# Patient Record
Sex: Female | Born: 1960
Health system: Southern US, Community
[De-identification: ages and names within clinical notes are randomized; demographics above are authoritative.]

## PROBLEM LIST (undated history)

## (undated) DIAGNOSIS — I10 Essential (primary) hypertension: Secondary | ICD-10-CM

## (undated) DIAGNOSIS — M1990 Unspecified osteoarthritis, unspecified site: Secondary | ICD-10-CM

## (undated) DIAGNOSIS — Z9889 Other specified postprocedural states: Secondary | ICD-10-CM

## (undated) DIAGNOSIS — D649 Anemia, unspecified: Secondary | ICD-10-CM

## (undated) DIAGNOSIS — Z8673 Personal history of transient ischemic attack (TIA), and cerebral infarction without residual deficits: Secondary | ICD-10-CM

## (undated) DIAGNOSIS — R112 Nausea with vomiting, unspecified: Secondary | ICD-10-CM

## (undated) DIAGNOSIS — G43909 Migraine, unspecified, not intractable, without status migrainosus: Secondary | ICD-10-CM

## (undated) HISTORY — PX: NECK SURGERY: SHX720

## (undated) HISTORY — PX: UPPER GI ENDOSCOPY: SHX6162

## (undated) HISTORY — PX: COLONOSCOPY: SHX174

## (undated) HISTORY — PX: ABDOMINAL HYSTERECTOMY: SHX81

---

## 1999-03-30 ENCOUNTER — Other Ambulatory Visit: Admission: RE | Admit: 1999-03-30 | Discharge: 1999-03-30 | Payer: Self-pay | Admitting: Obstetrics & Gynecology

## 1999-05-28 ENCOUNTER — Emergency Department (HOSPITAL_COMMUNITY): Admission: EM | Admit: 1999-05-28 | Discharge: 1999-05-28 | Payer: Self-pay | Admitting: Emergency Medicine

## 1999-07-07 ENCOUNTER — Encounter: Payer: Self-pay | Admitting: Endocrinology

## 1999-07-07 ENCOUNTER — Ambulatory Visit (HOSPITAL_COMMUNITY): Admission: RE | Admit: 1999-07-07 | Discharge: 1999-07-07 | Payer: Self-pay | Admitting: Endocrinology

## 1999-07-28 ENCOUNTER — Encounter: Payer: Self-pay | Admitting: Endocrinology

## 1999-07-28 ENCOUNTER — Ambulatory Visit (HOSPITAL_COMMUNITY): Admission: RE | Admit: 1999-07-28 | Discharge: 1999-07-28 | Payer: Self-pay | Admitting: Endocrinology

## 1999-10-05 ENCOUNTER — Ambulatory Visit (HOSPITAL_COMMUNITY): Admission: RE | Admit: 1999-10-05 | Discharge: 1999-10-05 | Payer: Self-pay | Admitting: Obstetrics & Gynecology

## 1999-12-03 ENCOUNTER — Encounter: Payer: Self-pay | Admitting: Gastroenterology

## 1999-12-03 ENCOUNTER — Ambulatory Visit (HOSPITAL_COMMUNITY): Admission: RE | Admit: 1999-12-03 | Discharge: 1999-12-03 | Payer: Self-pay | Admitting: Gastroenterology

## 1999-12-21 ENCOUNTER — Inpatient Hospital Stay (HOSPITAL_COMMUNITY): Admission: AD | Admit: 1999-12-21 | Discharge: 1999-12-21 | Payer: Self-pay | Admitting: Obstetrics & Gynecology

## 2000-01-26 ENCOUNTER — Inpatient Hospital Stay (HOSPITAL_COMMUNITY): Admission: AD | Admit: 2000-01-26 | Discharge: 2000-01-26 | Payer: Self-pay | Admitting: *Deleted

## 2000-01-27 ENCOUNTER — Encounter: Payer: Self-pay | Admitting: Obstetrics & Gynecology

## 2000-01-27 ENCOUNTER — Inpatient Hospital Stay (HOSPITAL_COMMUNITY): Admission: AD | Admit: 2000-01-27 | Discharge: 2000-01-27 | Payer: Self-pay | Admitting: Obstetrics & Gynecology

## 2000-03-04 ENCOUNTER — Other Ambulatory Visit: Admission: RE | Admit: 2000-03-04 | Discharge: 2000-03-04 | Payer: Self-pay | Admitting: Obstetrics & Gynecology

## 2000-03-10 ENCOUNTER — Ambulatory Visit (HOSPITAL_COMMUNITY): Admission: RE | Admit: 2000-03-10 | Discharge: 2000-03-10 | Payer: Self-pay | Admitting: *Deleted

## 2000-03-10 ENCOUNTER — Encounter (INDEPENDENT_AMBULATORY_CARE_PROVIDER_SITE_OTHER): Payer: Self-pay | Admitting: Specialist

## 2000-03-12 ENCOUNTER — Inpatient Hospital Stay (HOSPITAL_COMMUNITY): Admission: AD | Admit: 2000-03-12 | Discharge: 2000-03-15 | Payer: Self-pay | Admitting: Obstetrics and Gynecology

## 2000-03-13 ENCOUNTER — Encounter: Payer: Self-pay | Admitting: Obstetrics and Gynecology

## 2000-03-14 ENCOUNTER — Encounter: Payer: Self-pay | Admitting: Obstetrics and Gynecology

## 2000-10-04 ENCOUNTER — Encounter (INDEPENDENT_AMBULATORY_CARE_PROVIDER_SITE_OTHER): Payer: Self-pay | Admitting: Specialist

## 2000-10-04 ENCOUNTER — Observation Stay (HOSPITAL_COMMUNITY): Admission: RE | Admit: 2000-10-04 | Discharge: 2000-10-05 | Payer: Self-pay | Admitting: Obstetrics and Gynecology

## 2001-03-18 ENCOUNTER — Emergency Department (HOSPITAL_COMMUNITY): Admission: EM | Admit: 2001-03-18 | Discharge: 2001-03-18 | Payer: Self-pay

## 2001-03-22 ENCOUNTER — Encounter: Admission: RE | Admit: 2001-03-22 | Discharge: 2001-03-22 | Payer: Self-pay | Admitting: Endocrinology

## 2001-03-22 ENCOUNTER — Encounter: Payer: Self-pay | Admitting: Endocrinology

## 2002-09-20 ENCOUNTER — Other Ambulatory Visit: Admission: RE | Admit: 2002-09-20 | Discharge: 2002-09-20 | Payer: Self-pay | Admitting: Obstetrics and Gynecology

## 2002-10-12 ENCOUNTER — Encounter: Payer: Self-pay | Admitting: Obstetrics and Gynecology

## 2002-10-12 ENCOUNTER — Ambulatory Visit (HOSPITAL_COMMUNITY): Admission: RE | Admit: 2002-10-12 | Discharge: 2002-10-12 | Payer: Self-pay | Admitting: Obstetrics and Gynecology

## 2004-12-22 ENCOUNTER — Other Ambulatory Visit: Admission: RE | Admit: 2004-12-22 | Discharge: 2004-12-22 | Payer: Self-pay | Admitting: Obstetrics and Gynecology

## 2006-03-29 ENCOUNTER — Other Ambulatory Visit: Admission: RE | Admit: 2006-03-29 | Discharge: 2006-03-29 | Payer: Self-pay | Admitting: Obstetrics and Gynecology

## 2006-04-06 ENCOUNTER — Ambulatory Visit (HOSPITAL_COMMUNITY): Admission: RE | Admit: 2006-04-06 | Discharge: 2006-04-06 | Payer: Self-pay | Admitting: Obstetrics and Gynecology

## 2007-11-30 ENCOUNTER — Ambulatory Visit (HOSPITAL_COMMUNITY): Admission: RE | Admit: 2007-11-30 | Discharge: 2007-11-30 | Payer: Self-pay | Admitting: Obstetrics and Gynecology

## 2008-12-24 ENCOUNTER — Encounter: Admission: RE | Admit: 2008-12-24 | Discharge: 2008-12-24 | Payer: Self-pay | Admitting: Endocrinology

## 2009-01-14 ENCOUNTER — Encounter: Admission: RE | Admit: 2009-01-14 | Discharge: 2009-01-14 | Payer: Self-pay | Admitting: Internal Medicine

## 2009-06-03 ENCOUNTER — Encounter: Admission: RE | Admit: 2009-06-03 | Discharge: 2009-06-03 | Payer: Self-pay | Admitting: Diagnostic Radiology

## 2009-08-08 ENCOUNTER — Encounter: Admission: RE | Admit: 2009-08-08 | Discharge: 2009-08-08 | Payer: Self-pay | Admitting: Neurosurgery

## 2009-08-13 ENCOUNTER — Ambulatory Visit (HOSPITAL_COMMUNITY): Admission: RE | Admit: 2009-08-13 | Discharge: 2009-08-14 | Payer: Self-pay | Admitting: Neurosurgery

## 2010-08-23 ENCOUNTER — Encounter: Payer: Self-pay | Admitting: Obstetrics and Gynecology

## 2010-10-18 LAB — CBC
HCT: 36.6 % (ref 36.0–46.0)
MCV: 88.6 fL (ref 78.0–100.0)
Platelets: 175 10*3/uL (ref 150–400)
RBC: 4.13 MIL/uL (ref 3.87–5.11)
RDW: 12.9 % (ref 11.5–15.5)
WBC: 4 10*3/uL (ref 4.0–10.5)

## 2010-10-18 LAB — BASIC METABOLIC PANEL
CO2: 31 mEq/L (ref 19–32)
Calcium: 9.9 mg/dL (ref 8.4–10.5)
Chloride: 99 mEq/L (ref 96–112)
Creatinine, Ser: 0.95 mg/dL (ref 0.4–1.2)
GFR calc Af Amer: >60 mL/min (ref >60)
GFR calc non Af Amer: >60 mL/min (ref >60)
Glucose, Bld: 83 mg/dL (ref 70–99)
Sodium: 139 mEq/L (ref 135–145)

## 2010-12-18 NOTE — Op Note (Signed)
[facility]  Patient:    Tammy Aguirre, Tammy Aguirre                     MRN: [id number] Proc. Date: 03/10/00 Adm. Date:  [id number] Attending:  Leonard Schwartz                           Operative Report  PREOPERATIVE DIAGNOSIS:           Pelvic pain; desires permanent                                   sterilization.  SURGEON:                          Cecilio Asper, M.D.  PROCEDURE:                        1. Diagnostic laparoscopy with lysis                                      of adhesions.                                   2. Peritoneal biopsy.                                   3. Bilateral tubal fulguration.  ANESTHESIA:                       General.  ESTIMATED BLOOD LOSS:             Minimal.  FINDINGS:                         Fibroid uterus. Questionable mattress window in the left lower quadrant.  Brownish lesion underneath the uterus, towards the left posterior cul-de-sac.  Right lower quadrant intestinal abdominal adhesions.  COMPLICATIONS:                    None.  HISTORY:                          The patient is a 50 year old gravida 3, para 2-0-1-2; who presents complaining of right lower quadrant pelvic pain. The patient has a family history of endometriosis.  She desires diagnostic laparoscopy for diagnosis.  She also desires bilateral tubal fulguration for permanent sterilization.  She understands that this procedure is to be considered and irreversible.  She also understands the small risk of tubal failure, and should failure occur there is an increased risk of ectopic pregnancy.  DESCRIPTION OF PROCEDURE:         After an adequate of general anesthesia was obtained, the patient was prepped in sterile fashion.  The bladder was drained of urine with a red rubber catheter.  The speculum was placed inside of the vagina.  A single-tooth tenaculum was placed in the anterior lip of the cervix, and the Coley cannula was placed  inside the cervical os.  The speculum was then  removed.  The patient was subsequently draped.  Attention was turned to the abdominal region, where an infraumbilical incision was made.  A Veress needle was placed without difficulty.  Opening pressures of CO2 gas were less than 5 mmHg, to approximately two liters of CO2 gas was insufflated into the pelvis and abdomen.  A 10 mm trocar was inserted into this incision.  Two 5 mm trocars were placed; one in the midline suprapubic region, the other in the right lower quadrant.  The brownish lesion in the posterior cul-de-sac was then elevated with a grasper and excised with Hook scissors.  The intestinal/abdominal adhesions were then excised with the Hook scissors.  The mattress window was noted, and pictures were taken.  The fallopian tubes were then identified and followed out to their fimbriated end; fulgurated x 3 without difficulty.  Hemostasis was assured.                                    The lower abdominal ports were then removed using direct visualization.  The camera was removed.  CO2 gas was removed from the pelvis and abdomen.  The 10 mm trocar was then removed.  The umbilical incision was reapproximated with subcuticular 4-0 Vicryl.  Steri-Strips were placed on the 5 mm midline and right lower quadrant port.  Bandages were applied.  Vaginal instruments were removed.  The patient tolerated the procedure well.  She was taken to the recovery room in stable condition.  POSTOPERATIVE DIAGNOSIS:          Pelvic pain, desires permanent                                   sterilization. DD:  03/10/00 TD:  03/11/00 Job: [id number]

## 2010-12-18 NOTE — Op Note (Signed)
[facility]  Patient:    Tammy Aguirre, Tammy Aguirre                     MRN: [id number] Proc. Date: 10/04/00 Adm. Date:  [id number] Attending:  Leonard Schwartz                           Operative Report  PREOPERATIVE DIAGNOSES:       1. Pelvic pain.                               2. Endometriosis.                               3. Fibroid uterus.  POSTOPERATIVE DIAGNOSES:      1. Pelvic pain.                               2. Endometriosis.                               3. Fibroid uterus.  PROCEDURE:                    Vaginal hysterectomy with vaginal bilateral                               salpingo-oophorectomy.  SURGEON:                      Janine Limbo, M.D.  ASSISTANT:                    Henreitta Leber, P.A.-C.  ANESTHESIA:                   General.  ESTIMATED BLOOD LOSS:         100 cc.  DISPOSITION:                  Ms. Mcquilkin is a 50 year old female who presents with a known history of endometriosis. She has been treated with Lupron Depot in the past but her pain has returned. She does not desire to have any more children. Pain medications have not relieved her discomfort. She is ready to proceed at this point with definitive therapy. The patient understands the indications for her surgical procedure and she accepts the risks of, but not limited to, anesthetic complications, bleeding, infections, and possible damage to the surrounding organs.  FINDINGS:                     The uterus was normal size, shape, and consistency. There was a small fibroid present. The fallopian tubes and ovaries appeared normal.  DESCRIPTION OF PROCEDURE:     The patient was taken to the operating room where a general anesthetic was given. The patients abdomen, perineum, and vagina were prepped with multiple layers of Betadine. A Foley catheter was placed in the bladder. The patient was sterilely draped. Examination under anesthesia was performed. The  cervix was injected with a diluted solution of Pitressin and saline. A circumferential incision was made around the cervix and the anterior and posterior cul-de-sacs were sharply  entered. Alternating from right to left, the uterosacral ligaments, paracervical tissues, parametrial tissues, uterine arteries, and upper pedicles were clamped, cut, sutured, and tied securely. The left infundibulopelvic ligament was isolated. The ligament was then clamped, cut, free tied, and suture ligated. An identical procedure was carried out on the opposite side. Hemostasis was adequate throughout. Sutures attached to the uterosacral ligaments were then brought out through the vaginal angles and tied securely. A McCall culdoplasty suture was placed in the posterior cul-de-sac incorporating the uterosacral ligaments bilaterally and the posterior peritoneum. A final check was made for hemostasis and hemostasis was indeed adequate. The vaginal cuff was then closed using figure-of-eight sutures incorporating the anterior vaginal mucosa, the anterior peritoneum, the posterior peritoneum, and then the posterior vaginal mucosa. The McCall culdoplasty suture was tied and the apex of the vagina was noted to elevate into the mid pelvis. Sponge, needle, and instrument counts were correct on two occasions. The estimated blood loss was 100 cc. 0 Vicryl was the suture material used throughout the case. The patient tolerated her procedure well. She was awakened from her anesthetic and taken to the recovery room in stable condition. She was noted to drains clear yellow urine at the end of her procedure. DD:  10/04/00 TD:  10/04/00 Job: [id number]

## 2010-12-18 NOTE — H&P (Signed)
[facility]  Patient:    Tammy Aguirre, Tammy Aguirre                       MRN: [id number] Adm. Date:  10/04/00 Attending:  Janine Limbo, M.D.                         History and Physical  HISTORY OF PRESENT ILLNESS:   Tammy Aguirre is a 50 year old female, para 2-0-1-2, who presents for vaginal hysterectomy with bilateral salpingo-oophorectomy because of pelvic pain and endometrial cysts.  The patient had a diagnostic laparoscopy in August 2001 where she was found to have endometriosis.  She was treated with Lupron Depot with relief of her symptoms.  Her pain returned after her Lupron Depot was continued.  At this point, the patient does not plan to have any more children, and she is ready to proceed with definitive therapy.  She has had a tubal ligation in the past. Pain medicines have not been able to keep her comfortable.  She denies a history of sexually transmitted infections in the past.  She has had a cesarean section in the past as well.  OBSTETRICAL HISTORY:          The patient has had a cesarean in the past.  DRUG ALLERGIES:               TETRACYCLINE causes vomiting and a rash.  PAST MEDICAL HISTORY:         The patient has a history of hypertension.  CURRENT MEDICATIONS:          She is currently taking Tiazac 350 mg each day. She also takes a diuretic as needed.  She has recently taken Benadryl because of cold symptoms.  She takes Vicodin as needed for pain.  SOCIAL HISTORY:               The patient denies cigarette use, alcohol [place], and recreational drug use.  She is married.  She works as a Diplomatic Services operational officer at [employer].  Review of Systems             Noncontributory.  FAMILY HISTORY:               The patients mother and father have hypertension.  PHYSICAL EXAMINATION:  VITAL SIGNS:                  Weight is 138 pounds, height 5 feet 1/2 inch.  HEENT:                        Within normal limits.  CHEST:                         Clear.  HEART:                        Regular rate and rhythm.  BREASTS:                      Without masses.  ABDOMEN:                      Nontender.  EXTREMITIES:                  Within normal limits.  NEUROLOGIC:  Exam is normal.  PELVIC:                       External genitalia is normal.  Vagina is normal. Cervix is nontender.  Uterus is normal size, shape, and consistency.  Adnexa has no masses.  Rectovaginal exam confirms.  ASSESSMENT:                   1. Pelvic pain.                               2. Endometriosis.                               3. Fibroids noted on prior laparoscopy.  PLAN:                         A long discussion was held with the patient about her medical and surgical options.  After carefully reviewing the risks and benefits of each of those options, the patient has decided to proceed with vaginal hysterectomy and vaginal bilateral salpingo-oophorectomy.  She understands that hormone replacement therapy will be needed.  The patient understands the indications for her surgical procedure, and she accepts the risk of, but not limited to, anesthetic complications, bleeding, infections, and possible damage to the surrounding organs. DD:  10/03/00 TD:  10/04/00 Job: [id number]

## 2010-12-18 NOTE — Discharge Summary (Signed)
[facility]  Patient:    Tammy Aguirre, Tammy Aguirre                     MRN: [id number] Adm. Date:  [id number] Disc. Date: [id number] Attending:  Leonard Schwartz Dictator:   Henreitta Leber, P.A.C.                           Discharge Summary  DISCHARGE DIAGNOSES:          1. Severe right pelvic pain, two days                                  postoperative laparoscopic surgery.                               2. Chronic hypertension.                               3. Leukopenia.  PROCEDURES:                   Chest x-ray.  Abdominal pelvic CT scan                               and IV antibiotics.  HISTORY OF PRESENT ILLNESS:   This is a 50 year old female gravida 3, para 2-0-1-2 with a history of hypertension who was postoperative laparoscopic for lysis of adhesions on intestinal and abdominal walls, biopsy of endometriosis lesions in the cul-de-sac and bilateral tubal ligation.  (The patient was also found to have a fibroid).  She presented to [facility] reporting a fever of 103.7 at home with severe right flank and lower pelvic pain.  See dictated history for details.  ADMISSION PHYSICAL EXAMINATION:  VITAL SIGNS:                  Blood pressure 152/95, temperature 100.5, pulse 91, respiratory rate 24.  GENERAL EXAMINATION:          Within normal limits.  ABDOMEN:                      Bowel sounds present.  Abdomen distended with tenderness and guarding in both lower quadrants right greater than left accompanied by rebound.  Umbilical incision is clean, dry and intact.  PELVIC:                       EGBUS is within normal limits.  Vagina tender over the bladder area.  Cervix and uterus were both tender without palpable masses.  Adnexa was full on the right accompanied by tenderness.  HOSPITAL COURSE:              On the date of admission the patient was placed on analgesia and IV antibiotics and gradually showed improvement in her pain and temperature  which became maximized on hospital stay day #3 at 101.8. Bowel and bladder functions remained active throughout her hospital stay.  CT scan of abdomen and pelvis did not reveal any masses or abscesses and her chest x-ray only revealed a possible small pleural effusion. The patients fever had resolved and pelvic pain returned to baseline by hospital day #4 and was  deemed ready for discharge to home.  DISCHARGE LABS:               Hemoglobin 10.4 (admission hemoglobin 11.6), white blood cell count 2.5 (admission white blood cell count 7.3).  DISCHARGE MEDICATIONS:        Vicodin one tablet every four hours as needed for pain.  FOLLOW-UP:                    The patient is to return to [facility] on March 17, 2000, at 8:45 for a repeat complete blood count. She also is also to follow up with Cecilio Asper, M.D., on March 24, 2000, for evaluation.  DISCHARGE INSTRUCTIONS:       The patient is to keep her laparoscopic incision wound clean and dry.  ACTIVITY:                     This is as the patient is able to tolerate.  She may return to work.  DIET:                         Without restrictions. DD:  04/21/00 TD:  04/22/00 Job: [id number]

## 2010-12-18 NOTE — H&P (Signed)
[facility]  Patient:    Tammy Aguirre, Tammy Aguirre                     MRN: [id number] Adm. Date:  [id number] Disc. Date: [id number] Attending:  Cleatrice Burke Dictator:   Miguel Dibble, C.N.M.                         History and Physical  HISTORY OF PRESENT ILLNESS:   This is a 50 year old gravida 3, para 2-0-1-2, who is two days postoperative laparoscopic surgery at which time she had lysis of adhesions on the intestinal walls and abdominal walls, biopsy of endometriosis lesions in the cul-de-sac and a bilateral tubal ligation.  She was also found to have a fibroid.  She presented in maternity admissions unit having stated that she had a fever that reached as high as 103.7 at home and severe right flank and right lower pelvic pain.  She has been passing flatus during the day and without difficulty since her surgery, however, she did not have a bowel movement since August 9.  She has a history of chronic hypertension and has been on Tiazac 360 mg daily which she has taken today. She has not taken her diuretic today.  On admission her laboratory results were a hemoglobin 11.6, hematocrit 36.2, white count 7.3, platelets 169. Clean-catch urinalysis was negative for nitrites, wbcs and rbcs.  Her temperature is 105, pulse 91, respirations 24, blood pressure 152/95.  MEDICAL HISTORY:              Significant for allergy to tetracycline, chronic hypertension, laparoscope for pelvic pain in 1999 that did not reveal endometriosis at the time, one miscarriage, two normal vaginal deliveries, cesarean section delivery, otherwise her previous medical history was noncontributory.  FAMILY HISTORY:               Significant for a strong family history of endometriosis, migraines and heart disease.  SOCIAL HISTORY:               The patient denies smoking, alcohol or drug abuse.  She is married and her husband is present and supportive.  PHYSICAL  EXAMINATION:  HEAD, EYES, EARS AND THROAT:  Within normal limits.  LUNGS:                        Bilaterally clear.  HEART:                        Regular rate and rhythm.  ABDOMEN:                      The abdomen has a generalized tenderness and is distended, positive bowel sounds, no obvious masses.  Umbilical incision is clean, dry and intact.  PELVIC:                       No vaginal bleeding noted.  EXTREMITIES:                  Negative edema.  DTRs +1.  ASSESSMENT:                   1. Severe right pelvic pain and right flank  pain, two days postoperative laparoscopic                                  surgery.                               2. Chronic hypertension.                               3. Rule out bowel complications of surgery.  PLAN:                         Admit for a 23-hour observation.  Dr. Stefano Gaul will manage the patient.  Will consider a CAT scan of the abdomen.  IV antibiotics, Cefotan and Cleocin.  Anticipate discharge on August 12, if stable. DD:  03/12/00 TD:  03/13/00 Job: [id number]

## 2010-12-18 NOTE — H&P (Signed)
[facility]  Patient:    Tammy Aguirre, Tammy Aguirre                     MRN: [id number] Adm. Date:  [id number] Disc. Date: [id number] Attending:  Cleatrice Burke                         History and Physical  HISTORY OF PRESENT ILLNESS:   The patient is a 50 year old gravida 3, para 2-0-1-2, who presented in August complaining of right lower abdominal pain since the birth of her son 1-1/2 years ago.  The patient initially stated that the pain was uncomfortable when lying down or picking up her children, and at that point it was thought the patient was having musculoskeletal pain.  In May of this year, the patient complained of severe abdominal pain in the left lower quadrant, and this resolved after a dose of IM Toradol.  In June of this year, the patient represented complaining of right lower quadrant abdominal pelvic pain.  She stated that this pain began one week prior to her menstrual period and that it was totally different from any of the pains that she had noted in the past.  She described the pain as being sharp and achy and was worsening in intensity, worsened with walking, and was accompanied by a sensation of pressure.  She did have a laparoscopy in 1999 for pelvic pain, which did not reveal any significant findings per the patient.  The patient states that her two sisters both have endometriosis, and the patient desires diagnostic laparoscopy to evaluate the etiology of her pelvic pain.  The patient is also desiring to end her fertility.  She understands that permanent sterilization is considered permanent and irreversible.  She also understands there is a small risk of tubal failure, and should failure occur, there is an increased risk of ectopic pregnancy.  ALLERGIES:                    The patient has no known drug allergies.  MEDICATIONS:                  Tiazac 360 mg a day, and triamterene/HCTZ as needed.  PAST MEDICAL HISTORY:          Medical illnesses:  Hypertension.  PAST SURGICAL HISTORY:        Wisdom tooth extraction, cesarean delivery, and laparoscopy.  FAMILY HISTORY:               The patient has a sister with a history of migraines, mother and father have heart disease.  She also has a sister with endometriosis.  SOCIAL HISTORY:               The patient denies tobacco or recreational drug use.  She rarely drinks alcoholic beverages.  She is married and denies domestic violence.  REVIEW OF SYSTEMS:            The patient denies nausea, vomiting, or diarrhea.  Her menstrual cycles occur once a month, last two to three days, and she only uses three to four pads per day.  The patient denies loose bowel movements, dyspareunia, fever, back pain, vomiting, or diarrhea.  PHYSICAL EXAMINATION:  VITAL SIGNS:                  Blood pressure is 110/70, weight is 138.  HEENT:  Within normal limits.  LUNGS:                        Clear to auscultation bilaterally.  HEART:                        Regular rate and rhythm.  ABDOMEN:                      Soft with right lower quadrant tenderness to deep palpation.  There is no rebound, no guarding, no CVA tenderness.  EXTREMITIES:                  No clubbing, cyanosis, or edema.  PELVIC:                       Normal external female genitalia.  Vagina is without discharge.  Cervix without lesion.  Uterus is normal size, shape, and consistency.  Adnexa:  The right adnexa is tender to deep palpation.  The patient states that the pain radiates to the medial aspect of her right thigh.  RECTAL:                       Noncontributory.  LABORATORY DATA:              Her white count from May 2001 is 5.9, hemoglobin 11.5, hematocrit 34.6, platelet count 179.  Differential is within normal limits.  Pelvic ultrasound from June 27 is normal appearance of both ovaries, normal retroverted uterus, small amount of free fluid in the right adnexa and adjacent  to the uterus, no evidence of hydronephrosis.  Chem-9 from June 27 is negative.  Pap from August 2000 is benign.  GC and Chlamydia cultures May 2001 were negative.  Urine culture June 2001 is negative.  ASSESSMENT:                   1. Family history of endometriosis with right lower quadrant pain, increasing in intensity.                               2. Desires permanent sterilization.  PLAN:                         The risks, benefits, and alternatives of diagnostic laparoscopy were reviewed.  The alternatives included Depo-Lupron. The patient desires definitive therapy.  She understands the risks to include but not be limited to a risk of bleeding, a risk of infection, a risk of damage to internal origins such as bowel, bladder, and ureter.  The patient also understands that 30% of diagnostic laparoscopies are negative.  The patient is willing to accept these risks.  She has therefore been consented. The procedure will be performed on August 9. DD:  03/10/00 TD:  03/10/00 Job: [id number]

## 2011-09-13 ENCOUNTER — Other Ambulatory Visit: Payer: Self-pay

## 2011-09-13 ENCOUNTER — Emergency Department (HOSPITAL_BASED_OUTPATIENT_CLINIC_OR_DEPARTMENT_OTHER): Payer: BC Managed Care – PPO

## 2011-09-13 ENCOUNTER — Emergency Department (HOSPITAL_BASED_OUTPATIENT_CLINIC_OR_DEPARTMENT_OTHER)
Admission: EM | Admit: 2011-09-13 | Discharge: 2011-09-13 | Disposition: A | Discharge status: Home / Self Care | Payer: BC Managed Care – PPO | Attending: Emergency Medicine | Admitting: Emergency Medicine

## 2011-09-13 ENCOUNTER — Encounter (HOSPITAL_BASED_OUTPATIENT_CLINIC_OR_DEPARTMENT_OTHER): Payer: Self-pay | Admitting: Family Medicine

## 2011-09-13 ENCOUNTER — Emergency Department (INDEPENDENT_AMBULATORY_CARE_PROVIDER_SITE_OTHER): Payer: BC Managed Care – PPO

## 2011-09-13 DIAGNOSIS — E876 Hypokalemia: Secondary | ICD-10-CM | POA: Insufficient documentation

## 2011-09-13 DIAGNOSIS — R0602 Shortness of breath: Secondary | ICD-10-CM

## 2011-09-13 DIAGNOSIS — R059 Cough, unspecified: Secondary | ICD-10-CM

## 2011-09-13 DIAGNOSIS — R05 Cough: Secondary | ICD-10-CM

## 2011-09-13 DIAGNOSIS — I10 Essential (primary) hypertension: Secondary | ICD-10-CM | POA: Insufficient documentation

## 2011-09-13 DIAGNOSIS — R079 Chest pain, unspecified: Secondary | ICD-10-CM | POA: Insufficient documentation

## 2011-09-13 DIAGNOSIS — R071 Chest pain on breathing: Secondary | ICD-10-CM | POA: Insufficient documentation

## 2011-09-13 DIAGNOSIS — R0789 Other chest pain: Secondary | ICD-10-CM

## 2011-09-13 HISTORY — DX: Essential (primary) hypertension: I10

## 2011-09-13 LAB — CBC
MCH: 29.1 pg (ref 26.0–34.0)
Platelets: 190 10*3/uL (ref 150–400)
RBC: 3.95 MIL/uL (ref 3.87–5.11)
WBC: 5.8 10*3/uL (ref 4.0–10.5)

## 2011-09-13 LAB — DIFFERENTIAL
Basophils Relative: 1 % (ref 0–1)
Monocytes Relative: 8 % (ref 3–12)
Neutro Abs: 3.7 10*3/uL (ref 1.7–7.7)
Neutrophils Relative %: 64 % (ref 43–77)

## 2011-09-13 LAB — BASIC METABOLIC PANEL
Calcium: 9.3 mg/dL (ref 8.4–10.5)
Creatinine, Ser: 0.9 mg/dL (ref 0.50–1.10)
GFR calc Af Amer: 86 mL/min — ABNORMAL LOW (ref >90)
Potassium: 3 mEq/L — ABNORMAL LOW (ref 3.5–5.1)
Sodium: 140 mEq/L (ref 135–145)

## 2011-09-13 LAB — TROPONIN I: Troponin I: <0.3 ng/mL (ref <0.30)

## 2011-09-13 MED ORDER — POTASSIUM CHLORIDE CRYS ER 20 MEQ PO TBCR
40.0000 meq | EXTENDED_RELEASE_TABLET | Freq: Once | ORAL | Status: AC
  Administered 2011-09-13: 40 meq via ORAL

## 2011-09-13 MED ORDER — POTASSIUM CHLORIDE CRYS ER 20 MEQ PO TBCR
EXTENDED_RELEASE_TABLET | ORAL | Status: AC
  Administered 2011-09-13: 40 meq via ORAL
  Filled 2011-09-13: qty 2

## 2011-09-13 NOTE — ED Provider Notes (Signed)
This chart was scribed for Hurman Horn, MD by Wallis Mart. The patient was seen in room MHT14/MHT14 and the patient's care was started at 4:48 PM.   CSN: [id number]  Arrival date & time 09/13/11  1613   First MD Initiated Contact with Patient 09/13/11 1642      Chief Complaint  Patient presents with  . Chest Pain    (Consider location/radiation/quality/duration/timing/severity/associated sxs/prior treatment) HPI Tammy Aguirre is a 51 y.o. female who presents to the Emergency Department complaining of gradual onset, persistence of constant, moderate central chest pain onset several days ago.  Pt had an intermittent cough a week ago,was given cough medicine with codeine, cough improved. Nothing aggravates or improves the cp.  Pt states she felt SOB while walking up stairs to work.  Additionally, Pt's hand was swollen this morning. Pt denies rash, fever diarrhea dizziness, swollen legs, back pain.    Pt denies h/o blood clots in lungs or legs.    Past Medical History  Diagnosis Date  . Hypertension     Past Surgical History  Procedure Date  . Neck surgery   . Cesarean section   . Abdominal hysterectomy     History reviewed. No pertinent family history.  History  Substance Use Topics  . Smoking status: Never Smoker   . Smokeless tobacco: Not on file  . Alcohol Use: No    OB History    Grav Para Term Preterm Abortions TAB SAB Ect Mult Living                  Review of Systems  Constitutional: Negative for fever.       10 Systems reviewed and are negative for acute change except as noted in the HPI.  HENT: Negative for congestion.   Eyes: Negative for discharge and redness.  Respiratory: Positive for cough. Negative for shortness of breath.   Cardiovascular: Positive for chest pain.  Gastrointestinal: Negative for vomiting and abdominal pain.  Musculoskeletal: Negative for back pain.  Skin: Negative for rash.  Neurological: Negative for syncope, numbness and  headaches.  Psychiatric/Behavioral:       No behavior change.    Allergies  Tetracyclines & related  Home Medications   Current Outpatient Rx  Name Route Sig Dispense Refill  . ESTROGENS CONJUGATED 0.625 MG PO TABS Oral Take 0.625 mg by mouth daily. Take daily for 21 days then do not take for 7 days.    Marland Kitchen HYDROCHLOROTHIAZIDE 25 MG PO TABS Oral Take 25 mg by mouth daily.    Marland Kitchen VALSARTAN 80 MG PO TABS Oral Take 80 mg by mouth daily.      BP 157/76  Pulse 59  Temp(Src) 98.2 F (36.8 C) (Oral)  Resp 15  Ht 5\' 1"  (1.549 m)  Wt 125 lb (56.7 kg)  BMI 23.62 kg/m2  SpO2 100%  Physical Exam  Nursing note and vitals reviewed. Constitutional:       Awake, alert, nontoxic appearance.  HENT:  Head: Atraumatic.  Eyes: Right eye exhibits no discharge. Left eye exhibits no discharge.  Neck: Normal range of motion. Neck supple.  Cardiovascular: Normal rate, regular rhythm and normal heart sounds.   No murmur heard. Pulmonary/Chest: Effort normal and breath sounds normal. She has no wheezes. She exhibits tenderness.       Mild perasternal tenderness  Abdominal: Soft. There is no tenderness. There is no rebound.  Musculoskeletal: She exhibits no tenderness.       Baseline ROM, no obvious  new focal weakness.  Neurological:       Mental status and motor strength appears baseline for patient and situation.  Skin: No rash noted.  Psychiatric: She has a normal mood and affect.    ED Course  Procedures (including critical care time) DIAGNOSTIC STUDIES: Oxygen Saturation is 100% on room air, normal by my interpretation.    COORDINATION OF CARE:  8:30 PM: All results reviewed and discussed with pt, questions answered, pt agreeable with plan. Pt to be discharged.   Labs Reviewed  BASIC METABOLIC PANEL - Abnormal; Notable for the following:    Potassium 3.0 (*)    GFR calc non Af Amer 74 (*)    GFR calc Af Amer 86 (*)    All other components within normal limits  CBC - Abnormal;  Notable for the following:    Hemoglobin 11.5 (*)    HCT 33.8 (*)    All other components within normal limits  DIFFERENTIAL  TROPONIN I   No results found.   1. Chest wall pain   2. Hypokalemia       MDM  I doubt any other EMC precluding discharge at this time including, but not necessarily limited to the following:ACS.     I personally performed the services described in this documentation, which was scribed in my presence. The recorded information has been reviewed and considered.      Hurman Horn, MD 09/17/11 2255

## 2011-09-13 NOTE — ED Notes (Signed)
Pt c/o central chest pain "over a week" and also having a "cold and cough" for same length of time.

## 2011-09-13 NOTE — ED Provider Notes (Signed)
Several days of coughing with constant nonstop parasternal chest wall tenderness and chest pain which is nonradiating and without associated symptoms other than the cough and some slight shortness of breath when she was walking up stairs today but clear lungs in the emergency department PERC negative, the patient agrees no d-dimer or CT angiogram appears indicated.  ECG: Sinus bradycardia, ventricular rate 53, normal axis, no we'll intervals, no acute ischemic changes noted, no significant change compared to January 2011  Hurman Horn, MD 09/17/11 [phone]

## 2011-09-13 NOTE — ED Notes (Signed)
Was unable to obtain discharge signature due to sig pad locking up on mobile station.  Was unable to pull signature activity up to obtain signature.  Pt received discharge paperwork from this RN and voiced understanding that she would follow up with her pcp and take medications as prescribed.

## 2011-09-13 NOTE — ED Notes (Signed)
Pt has requested pain medication, crackers, and drink.  Dr Fonnie Jarvis informed of pt's request and pt's decr K level of 3.0.  No orders received for K or pain medication.  OK for pt to have drink and crackers.

## 2012-07-13 ENCOUNTER — Ambulatory Visit (INDEPENDENT_AMBULATORY_CARE_PROVIDER_SITE_OTHER): Payer: BC Managed Care – PPO | Admitting: Obstetrics and Gynecology

## 2012-07-13 ENCOUNTER — Encounter: Payer: Self-pay | Admitting: Obstetrics and Gynecology

## 2012-07-13 VITALS — BP 124/64 | HR 68 | Resp 16 | Ht 60.0 in | Wt 131.0 lb

## 2012-07-13 DIAGNOSIS — Z01419 Encounter for gynecological examination (general) (routine) without abnormal findings: Secondary | ICD-10-CM

## 2012-07-13 NOTE — Progress Notes (Signed)
Subjective:    Tammy Aguirre is a 51 y.o. female No obstetric history on file. who presents for annual exam. The patient had a hysterectomy and BSO for endometriosis. She currently takes Premarin 0.625 mg daily. The patient complaints of stress associated with her daughter going off to college.  The following portions of the patient's history were reviewed and updated as appropriate: allergies, current medications, past family history, past medical history, past social history, past surgical history and problem list.  Review of Systems Pertinent items are noted in HPI. Gastrointestinal:No change in bowel habits, no abdominal pain, no rectal bleeding Genitourinary:negative for dysuria, frequency, hematuria, nocturia and urinary incontinence    Objective:     BP 124/64  Pulse 68  Resp 16  Ht 5' (1.524 m)  Wt 131 lb (59.421 kg)  BMI 25.58 kg/m2  Weight:  Wt Readings from Last 1 Encounters:  07/13/12 131 lb (59.421 kg)     BMI: Body mass index is 25.58 kg/(m^2). General Appearance: Alert, appropriate appearance for age. No acute distress HEENT: Grossly normal Neck / Thyroid: Supple, no masses, nodes or enlargement Lungs: clear to auscultation bilaterally Back: No CVA tenderness Breast Exam: No masses or nodes.No dimpling, nipple retraction or discharge. Cardiovascular: Regular rate and rhythm. S1, S2, no murmur Gastrointestinal: Soft, non-tender, no masses or organomegaly  ++++++++++++++++++++++++++++++++++++++++++++++++++++++++  Pelvic Exam: External genitalia: normal general appearance Vaginal: normal without tenderness, induration or masses Cervix: absent Adnexa: normal bimanual exam Uterus: absent Rectovaginal: normal rectal, no masses  ++++++++++++++++++++++++++++++++++++++++++++++++++++++++  Lymphatic Exam: Non-palpable nodes in neck, clavicular, axillary, or inguinal regions  Psychiatric: Alert and oriented, appropriate affect.      Assessment:    Normal gyn  exam   Overweight or obese: No  Pelvic relaxation: Yes  Menopausal symptoms: No. Severe: No.   Plan:    Mammogram.   Follow-up:  for annual exam  Premarin 0.625 mg daily  The updated Pap smear screening guidelines were discussed with the patient. The patient requested that I obtain a Pap smear: No.  Kegel exercises discussed: Yes.  Proper diet and regular exercise were reviewed.  Annual mammograms recommended starting at age 35. Proper breast care was discussed.  Screening colonoscopy is recommended beginning at age 36.  Regular health maintenance was reviewed.  Sleep hygiene was discussed.  Adequate calcium and vitamin D intake was emphasized.  Leonard Schwartz M.D.   Regular Periods: no Mammogram: yes  Monthly Breast Ex.: yes Exercise: yes  Tetanus < 10 years: yes Seatbelts: no  NI. Bladder Functn.: yes Abuse at home: yes  Daily BM's: yes Stressful Work: yes  Healthy Diet: yes Sigmoid-Colonoscopy: yes  Calcium: no Medical problems this year: None   LAST PAP:2009  Contraception: Hyst  Mammogram:  2012 WNL  PCP: Cammie Fulp  PMH: None  FMH: None  Last Bone Scan: None

## 2012-08-10 ENCOUNTER — Telehealth: Payer: Self-pay

## 2012-08-10 NOTE — Telephone Encounter (Signed)
Patient needs a doctors note for the day she was here up until today if possible, her husband will come pick this up when we call the number is [phone]

## 2012-08-10 NOTE — Telephone Encounter (Signed)
She was not seen here, she was trying to call the urgent care [facility], advised her she has called wrong office

## 2014-09-26 ENCOUNTER — Other Ambulatory Visit: Payer: Self-pay | Admitting: Family Medicine

## 2014-09-26 DIAGNOSIS — Z1231 Encounter for screening mammogram for malignant neoplasm of breast: Secondary | ICD-10-CM

## 2014-10-03 ENCOUNTER — Ambulatory Visit
Admission: RE | Admit: 2014-10-03 | Discharge: 2014-10-03 | Disposition: A | Discharge status: Home / Self Care | Payer: 59 | Source: Ambulatory Visit | Attending: Family Medicine | Admitting: Family Medicine

## 2014-10-03 DIAGNOSIS — Z1231 Encounter for screening mammogram for malignant neoplasm of breast: Secondary | ICD-10-CM

## 2014-10-07 ENCOUNTER — Other Ambulatory Visit: Payer: Self-pay | Admitting: Family Medicine

## 2014-10-07 DIAGNOSIS — R928 Other abnormal and inconclusive findings on diagnostic imaging of breast: Secondary | ICD-10-CM

## 2014-10-11 ENCOUNTER — Ambulatory Visit
Admission: RE | Admit: 2014-10-11 | Discharge: 2014-10-11 | Disposition: A | Discharge status: Home / Self Care | Payer: 59 | Source: Ambulatory Visit | Attending: Family Medicine | Admitting: Family Medicine

## 2014-10-11 DIAGNOSIS — R928 Other abnormal and inconclusive findings on diagnostic imaging of breast: Secondary | ICD-10-CM

## 2016-09-28 DIAGNOSIS — I87323 Chronic venous hypertension (idiopathic) with inflammation of bilateral lower extremity: Secondary | ICD-10-CM | POA: Diagnosis not present

## 2017-07-07 DIAGNOSIS — K648 Other hemorrhoids: Secondary | ICD-10-CM | POA: Diagnosis not present

## 2017-07-07 DIAGNOSIS — Z86010 Personal history of colonic polyps: Secondary | ICD-10-CM | POA: Diagnosis not present

## 2017-10-13 ENCOUNTER — Ambulatory Visit: Payer: Self-pay | Admitting: Podiatry

## 2017-10-13 DIAGNOSIS — J349 Unspecified disorder of nose and nasal sinuses: Secondary | ICD-10-CM | POA: Diagnosis not present

## 2017-10-13 DIAGNOSIS — J302 Other seasonal allergic rhinitis: Secondary | ICD-10-CM | POA: Diagnosis not present

## 2017-10-13 DIAGNOSIS — R0982 Postnasal drip: Secondary | ICD-10-CM | POA: Diagnosis not present

## 2017-10-19 DIAGNOSIS — R0981 Nasal congestion: Secondary | ICD-10-CM | POA: Diagnosis not present

## 2017-10-20 ENCOUNTER — Ambulatory Visit: Payer: 59 | Admitting: Podiatry

## 2017-10-20 ENCOUNTER — Encounter: Payer: Self-pay | Admitting: Podiatry

## 2017-10-20 VITALS — BP 121/82 | HR 96 | Resp 16

## 2017-10-20 DIAGNOSIS — N809 Endometriosis, unspecified: Secondary | ICD-10-CM | POA: Insufficient documentation

## 2017-10-20 DIAGNOSIS — I10 Essential (primary) hypertension: Secondary | ICD-10-CM | POA: Insufficient documentation

## 2017-10-20 DIAGNOSIS — Q828 Other specified congenital malformations of skin: Secondary | ICD-10-CM

## 2017-10-20 DIAGNOSIS — E663 Overweight: Secondary | ICD-10-CM | POA: Insufficient documentation

## 2017-10-20 NOTE — Progress Notes (Signed)
Subjective:  Patient ID: Tammy Aguirre, female    DOB: 05-12-1961,  MRN: [id number] HPI Chief Complaint  Patient presents with  . Foot Pain    Plantar forefoot right - callused area x couple years, pt has been here and had cut out before, tried trimming herself - no help, uncomfortable walking  . New Patient (Initial Visit)    57 y.o. female presents with the above complaint.   ROS.  Denies fever chills nausea vomiting muscle aches and pains.  No calf pain chest pain headache.  Past Medical History:  Diagnosis Date  . Hypertension    Past Surgical History:  Procedure Laterality Date  . ABDOMINAL HYSTERECTOMY    . CESAREAN SECTION    . NECK SURGERY      Current Outpatient Medications:  .  amLODipine (NORVASC) 5 MG tablet, amlodipine 5 mg tablet, Disp: , Rfl:  .  Beclomethasone Dipropionate (QNASL) 80 MCG/ACT AERS, QNASL 80 mcg/actuation nasal aerosol spray  USE 1-2 SPRAYS IN EACH NOSTRIL ONCE DAILY IN THE MORNING FOR STUFFY NOSE OR DRAINAGE, Disp: , Rfl:  .  estrogens, conjugated, (PREMARIN) 0.625 MG tablet, Take 0.625 mg by mouth daily. Take daily for 21 days then do not take for 7 days., Disp: , Rfl:  .  hydrochlorothiazide (HYDRODIURIL) 25 MG tablet, Take 25 mg by mouth daily., Disp: , Rfl:  .  montelukast (SINGULAIR) 10 MG tablet, Take 10 mg by mouth daily., Disp: , Rfl: 0 .  valsartan (DIOVAN) 80 MG tablet, Take 80 mg by mouth daily., Disp: , Rfl:   Allergies  Allergen Reactions  . Tetracyclines & Related Rash   Review of Systems Objective:   Vitals:   10/20/17 0814  BP: 121/82  Pulse: 96  Resp: 16    General: Well developed, nourished, in no acute distress, alert and oriented x3   Dermatological: Skin is warm, dry and supple bilateral. Nails x 10 are well maintained; remaining integument appears unremarkable at this time. There are no open sores, no preulcerative lesions, no rash or signs of infection present.  Porokeratotic lesion sub-second metatarsal  phalangeal joint area of the right foot also appears that it could be an old scar from previous excision of a wart.  No signs of verruca at this point.  Vascular: Dorsalis Pedis artery and Posterior Tibial artery pedal pulses are 2/4 bilateral with immedate capillary fill time. Pedal hair growth present. No varicosities and no lower extremity edema present bilateral.   Neruologic: Grossly intact via light touch bilateral. Vibratory intact via tuning fork bilateral. Protective threshold with Semmes Wienstein monofilament intact to all pedal sites bilateral. Patellar and Achilles deep tendon reflexes 2+ bilateral. No Babinski or clonus noted bilateral.   Musculoskeletal: No gross boney pedal deformities bilateral. No pain, crepitus, or limitation noted with foot and ankle range of motion bilateral. Muscular strength 5/5 in all groups tested bilateral.  Gait: Unassisted, Nonantalgic.    Radiographs:  None taken  Assessment & Plan:   Assessment: Poor keratoma cannot totally rule out verruca.  Plan: We discussed etiology pathology conservative versus surgical therapies at this point this does appear to be a porokeratotic lesion and we did nucleated this and placed Cantharone under occlusion she will wash this off thoroughly tomorrow morning I discussed with her that this may blister become painful though should calm down after couple of days.  I will follow-up with her in 6 weeks for re-debridement and reapplication of medication if necessary.     Max T.  [place], [place]

## 2017-10-24 DIAGNOSIS — L649 Androgenic alopecia, unspecified: Secondary | ICD-10-CM | POA: Diagnosis not present

## 2017-10-24 DIAGNOSIS — L659 Nonscarring hair loss, unspecified: Secondary | ICD-10-CM | POA: Diagnosis not present

## 2017-10-24 DIAGNOSIS — L210 Seborrhea capitis: Secondary | ICD-10-CM | POA: Diagnosis not present

## 2017-10-24 DIAGNOSIS — L219 Seborrheic dermatitis, unspecified: Secondary | ICD-10-CM | POA: Diagnosis not present

## 2017-10-24 DIAGNOSIS — L658 Other specified nonscarring hair loss: Secondary | ICD-10-CM | POA: Diagnosis not present

## 2017-10-24 DIAGNOSIS — L650 Telogen effluvium: Secondary | ICD-10-CM | POA: Diagnosis not present

## 2017-11-07 DIAGNOSIS — L219 Seborrheic dermatitis, unspecified: Secondary | ICD-10-CM | POA: Diagnosis not present

## 2017-11-07 DIAGNOSIS — L658 Other specified nonscarring hair loss: Secondary | ICD-10-CM | POA: Diagnosis not present

## 2017-12-01 ENCOUNTER — Ambulatory Visit: Payer: 59 | Admitting: Podiatry

## 2017-12-06 DIAGNOSIS — N6452 Nipple discharge: Secondary | ICD-10-CM | POA: Diagnosis not present

## 2017-12-13 ENCOUNTER — Other Ambulatory Visit: Payer: Self-pay

## 2017-12-13 ENCOUNTER — Encounter: Payer: Self-pay | Admitting: Podiatry

## 2017-12-13 ENCOUNTER — Ambulatory Visit (INDEPENDENT_AMBULATORY_CARE_PROVIDER_SITE_OTHER): Payer: 59 | Admitting: Podiatry

## 2017-12-13 DIAGNOSIS — Q828 Other specified congenital malformations of skin: Secondary | ICD-10-CM

## 2017-12-13 NOTE — Progress Notes (Signed)
She presents today for follow-up of callus subsecond metatarsal phalangeal joint of the right foot.  She states that I think is getting better.  It does not hurt any longer.  Objective: Signs are stable alert and oriented x3.  Pulses are palpable.  There is no erythema edema cellulitis drainage or odor porokeratotic lesion sub-second metatarsophalangeal joint is completely resolved at this point.  Assessment: Resolution of the lesion.  Plan: Follow-up with me on an as-needed basis.

## 2018-05-26 DIAGNOSIS — Z01411 Encounter for gynecological examination (general) (routine) with abnormal findings: Secondary | ICD-10-CM | POA: Diagnosis not present

## 2018-05-26 DIAGNOSIS — Z6824 Body mass index (BMI) 24.0-24.9, adult: Secondary | ICD-10-CM | POA: Diagnosis not present

## 2018-05-26 DIAGNOSIS — Z1231 Encounter for screening mammogram for malignant neoplasm of breast: Secondary | ICD-10-CM | POA: Diagnosis not present

## 2020-01-09 ENCOUNTER — Ambulatory Visit: Payer: 59 | Admitting: Podiatry

## 2020-01-10 ENCOUNTER — Ambulatory Visit: Payer: Self-pay | Admitting: Podiatry

## 2020-01-22 ENCOUNTER — Ambulatory Visit: Payer: Self-pay | Admitting: Podiatry

## 2020-01-22 ENCOUNTER — Ambulatory Visit: Payer: 59 | Admitting: Podiatry

## 2020-01-24 ENCOUNTER — Other Ambulatory Visit: Payer: Self-pay

## 2020-01-24 ENCOUNTER — Ambulatory Visit: Payer: 59 | Admitting: Podiatry

## 2020-01-24 VITALS — Temp 97.2°F

## 2020-01-24 DIAGNOSIS — Q828 Other specified congenital malformations of skin: Secondary | ICD-10-CM | POA: Diagnosis not present

## 2020-01-27 NOTE — Progress Notes (Signed)
She presents today for follow-up of her painful lesion to the plantar aspect of the second metatarsal of the right foot.  States is been bothering me again for the past year or so but I have been dealing with it.  Pain levels usually about a 6 out of 10 I been trimming it myself.  I been using medicated pads.  Objective: Vital signs are stable alert oriented x3.  Pulses are palpable.  Porokeratotic lesion subsecond metatarsophalangeal joint.  Plan: Mechanical debridement of the lesion today follow-up with her in about 6 weeks

## 2020-01-31 ENCOUNTER — Ambulatory Visit: Payer: 59 | Admitting: Podiatry

## 2020-03-06 ENCOUNTER — Ambulatory Visit: Payer: 59 | Admitting: Podiatry

## 2020-05-22 DIAGNOSIS — L658 Other specified nonscarring hair loss: Secondary | ICD-10-CM | POA: Diagnosis not present

## 2020-05-22 DIAGNOSIS — L219 Seborrheic dermatitis, unspecified: Secondary | ICD-10-CM | POA: Diagnosis not present

## 2020-06-16 DIAGNOSIS — Z1231 Encounter for screening mammogram for malignant neoplasm of breast: Secondary | ICD-10-CM | POA: Diagnosis not present

## 2020-06-16 DIAGNOSIS — N762 Acute vulvitis: Secondary | ICD-10-CM | POA: Diagnosis not present

## 2020-06-16 DIAGNOSIS — Z01419 Encounter for gynecological examination (general) (routine) without abnormal findings: Secondary | ICD-10-CM | POA: Diagnosis not present

## 2020-06-16 DIAGNOSIS — Z6823 Body mass index (BMI) 23.0-23.9, adult: Secondary | ICD-10-CM | POA: Diagnosis not present

## 2020-07-09 DIAGNOSIS — H43812 Vitreous degeneration, left eye: Secondary | ICD-10-CM | POA: Diagnosis not present

## 2020-10-09 ENCOUNTER — Encounter: Payer: Self-pay | Admitting: Orthopaedic Surgery

## 2020-10-09 ENCOUNTER — Other Ambulatory Visit: Payer: Self-pay

## 2020-10-09 ENCOUNTER — Ambulatory Visit (INDEPENDENT_AMBULATORY_CARE_PROVIDER_SITE_OTHER): Payer: BC Managed Care – PPO | Admitting: Orthopaedic Surgery

## 2020-10-09 ENCOUNTER — Ambulatory Visit (INDEPENDENT_AMBULATORY_CARE_PROVIDER_SITE_OTHER): Payer: BC Managed Care – PPO

## 2020-10-09 VITALS — Ht 61.0 in | Wt 125.0 lb

## 2020-10-09 DIAGNOSIS — M25561 Pain in right knee: Secondary | ICD-10-CM | POA: Insufficient documentation

## 2020-10-09 NOTE — Progress Notes (Signed)
Office Visit Note   Patient: Tammy Aguirre           Date of Birth: 1961/03/23           MRN: [id number] Visit Date: 10/09/2020              Requested by: Renford Dills, MD [address] [place],  [place] [address] PCP: Renford Dills, MD   Assessment & Plan: Visit Diagnoses:  1. Acute pain of right knee   2. Anterior knee pain, right     Plan: Mrs. Calkin bumped the anterior aspect of her right knee against her desk at work approximately a month ago.  She has been experiencing localized tenderness along the anterior aspect of her knee with some popping and also feels "weak".  X-rays are negative.  Skin was intact there is no evidence of a bursitis.  She probably has aggravated a pre-existing chondromalacia patella and maybe even bruised the patella.  I think she is going to be fine with time.  She can certainly use of Voltaren gel.  Should not have any limitation of activities yes all really she is somewhat significant for is she wanted come back with his piriform sclera if she like to come back  Follow-Up Instructions: Return in about 1 month (around 11/09/2020).   Orders:  Orders Placed This Encounter  Procedures  . XR KNEE 3 VIEW RIGHT   No orders of the defined types were placed in this encounter.     Procedures: No procedures performed   Clinical Data: No additional findings.   Subjective: Chief Complaint  Patient presents with  . Right Knee - Pain, Injury    DOI 09/08/2020  Patient presents today for right knee pain. She states that she bumped her right knee under her desk at work on 09/08/2020. She said that it tingles anteriorly and radiates down. She also feels like her knee is weak. She takes Advil if not 20 infection he still do not have a whole do not have the numbness and tingling like it is just just worried to punch there is no scan of 10.  Heart rate no rhinorrhea he was asked to pain here to just and any with ablation and standing heel  symptoms: See 6 is a 70 8PM very much I have Mr. I worry about 1 1 to be sure you are okay any x-ray awake 101 over asked which chair right foot there is no weakness no hyperfractionated really Dr. To you want to she has been icing. 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  HPI  Review of Systems   Objective: Vital Signs: Ht 5\' 1"  (1.549 m)   Wt 125 lb (56.7 kg)   BMI 23.62 kg/m   Physical Exam Constitutional:      Appearance: She is well-developed.  Eyes:     Pupils: Pupils are equal, round, and reactive to light.  Pulmonary:     Effort: Pulmonary effort is normal.  Skin:    General: Skin is warm and dry.  Neurological:     Mental Status: She is alert and oriented to person, place, and time.  Psychiatric:        Behavior: Behavior normal.     Ortho Exam right knee was not hot red or swollen.  No effusion.  Little bit of patella crepitation but no pain with compression.  No evidence of her prepatellar bursa.  No instability or joint pain medially or laterally.  Full extension and flexion  over 105 degrees.  No popliteal pain.  No calf discomfort.  Painless range of motion of her hip.  No specific tenderness over the prepatellar region or skin changes where she struck the knee Specialty Comments:  No specialty comments available.  Imaging: XR KNEE 3 VIEW RIGHT  Result Date: 10/09/2020 Films of the right knee were obtained in 3 projections standing.  No acute changes.  No obvious degenerative change.  Joint spaces are well-maintained.  No ectopic calcification.  No effusion    PMFS History: Patient Active Problem List   Diagnosis Date Noted  . Anterior knee pain, right 10/09/2020  . Overweight with body mass index (BMI) 25.0-29.9 10/20/2017  . Endometriosis 10/20/2017  . Hypertensive disorder 10/20/2017  . Cyst of ovary 08/02/1996  . Abnormal cervical Papanicolaou smear 08/03/1983   Past Medical History:  Diagnosis Date  . Hypertension     History reviewed. No pertinent family  history.  Past Surgical History:  Procedure Laterality Date  . ABDOMINAL HYSTERECTOMY    . CESAREAN SECTION    . NECK SURGERY     Social History   Occupational History  . Not on file  Tobacco Use  . Smoking status: Never Smoker  . Smokeless tobacco: Never Used  Substance and Sexual Activity  . Alcohol use: No  . Drug use: No  . Sexual activity: Yes    Birth control/protection: Surgical

## 2020-11-07 DIAGNOSIS — Z0000 Encounter for general adult medical examination without abnormal findings: Secondary | ICD-10-CM | POA: Diagnosis not present

## 2020-11-07 DIAGNOSIS — Z13220 Encounter for screening for lipoid disorders: Secondary | ICD-10-CM | POA: Diagnosis not present

## 2020-11-13 ENCOUNTER — Ambulatory Visit (INDEPENDENT_AMBULATORY_CARE_PROVIDER_SITE_OTHER): Payer: No Typology Code available for payment source | Admitting: Orthopaedic Surgery

## 2020-11-13 ENCOUNTER — Other Ambulatory Visit: Payer: Self-pay

## 2020-11-13 ENCOUNTER — Encounter: Payer: Self-pay | Admitting: Orthopaedic Surgery

## 2020-11-13 VITALS — Ht 61.0 in | Wt 125.0 lb

## 2020-11-13 DIAGNOSIS — M25561 Pain in right knee: Secondary | ICD-10-CM | POA: Diagnosis not present

## 2020-11-13 NOTE — Progress Notes (Signed)
Office Visit Note   Patient: Tammy Aguirre           Date of Birth: 23-Nov-1960           MRN: [id number] Visit Date: 11/13/2020              Requested by: Renford Dills, MD [address] [place],  [place] [address] PCP: Renford Dills, MD   Assessment & Plan: Visit Diagnoses:  1. Anterior knee pain, right     Plan: Tammy Aguirre returns a follow-up evaluation of her right knee pain.  She sustained an on-the-job injury as previously outlined when she struck the anterior aspect of her knee against her desk.  She is definitely improving and really not having much trouble at this point.  She does not limp.  She is not taking any specific medicine other than Voltaren gel and does not use any ambulatory aid.  There is no tenderness to touch over the anterior aspect of her knee.  There was very minimal patellar crepitation but no pain with compression.  She had some mild medial lateral joint pain but I thought this may be related to some mild arthritis.  Overall she is fine and I do not think she need any further treatment.  Plan to see her back as needed.  No permanent partial impairment and no restrictions on activity.  Urged her to let me know if she was having any problems in the future  Follow-Up Instructions: Return if symptoms worsen or fail to improve.   Orders:  No orders of the defined types were placed in this encounter.  No orders of the defined types were placed in this encounter.     Procedures: No procedures performed   Clinical Data: No additional findings.   Subjective: Chief Complaint  Patient presents with  . Right Knee - Follow-up  Patient presents today for follow up on her right knee pain. She is now two months out from injury. 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 Patient states that she is improving. She has minimal to no pain. She does state that her knee feels weak with weightbearing. She is applying Voltaren gel.  HPI  Review of  Systems   Objective: Vital Signs: Ht 5\' 1"  (1.549 m)   Wt 125 lb (56.7 kg)   BMI 23.62 kg/m   Physical Exam Constitutional:      Appearance: She is well-developed.  Eyes:     Pupils: Pupils are equal, round, and reactive to light.  Pulmonary:     Effort: Pulmonary effort is normal.  Skin:    General: Skin is warm and dry.  Neurological:     Mental Status: She is alert and oriented to person, place, and time.  Psychiatric:        Behavior: Behavior normal.     Ortho Exam right knee was not hot red warm or swollen.  Full quick extension and flexed over 100 degrees without instability.  Very mild medial and lateral joint pain diffusely but no pain along the anterior aspect of the knee and specifically no pain over the patella tendon or inferior pole of the patella.  No skin changes.  No effusion Specialty Comments:  No specialty comments available.  Imaging: No results found.   PMFS History: Patient Active Problem List   Diagnosis Date Noted  . Anterior knee pain, right 10/09/2020  . Overweight with body mass index (BMI) 25.0-29.9 10/20/2017  . Endometriosis 10/20/2017  . Hypertensive disorder 10/20/2017  .  Cyst of ovary 08/02/1996  . Abnormal cervical Papanicolaou smear 08/03/1983   Past Medical History:  Diagnosis Date  . Hypertension     History reviewed. No pertinent family history.  Past Surgical History:  Procedure Laterality Date  . ABDOMINAL HYSTERECTOMY    . CESAREAN SECTION    . NECK SURGERY     Social History   Occupational History  . Not on file  Tobacco Use  . Smoking status: Never Smoker  . Smokeless tobacco: Never Used  Substance and Sexual Activity  . Alcohol use: No  . Drug use: No  . Sexual activity: Yes    Birth control/protection: Surgical

## 2021-02-17 DIAGNOSIS — H5213 Myopia, bilateral: Secondary | ICD-10-CM | POA: Diagnosis not present

## 2021-04-28 DIAGNOSIS — T63461A Toxic effect of venom of wasps, accidental (unintentional), initial encounter: Secondary | ICD-10-CM | POA: Diagnosis not present

## 2021-04-28 DIAGNOSIS — J029 Acute pharyngitis, unspecified: Secondary | ICD-10-CM | POA: Diagnosis not present

## 2021-04-28 DIAGNOSIS — J309 Allergic rhinitis, unspecified: Secondary | ICD-10-CM | POA: Diagnosis not present

## 2021-06-19 DIAGNOSIS — Z1231 Encounter for screening mammogram for malignant neoplasm of breast: Secondary | ICD-10-CM | POA: Diagnosis not present

## 2021-06-19 DIAGNOSIS — Z6823 Body mass index (BMI) 23.0-23.9, adult: Secondary | ICD-10-CM | POA: Diagnosis not present

## 2021-06-19 DIAGNOSIS — G4700 Insomnia, unspecified: Secondary | ICD-10-CM | POA: Diagnosis not present

## 2021-06-19 DIAGNOSIS — L659 Nonscarring hair loss, unspecified: Secondary | ICD-10-CM | POA: Diagnosis not present

## 2021-06-19 DIAGNOSIS — Z01419 Encounter for gynecological examination (general) (routine) without abnormal findings: Secondary | ICD-10-CM | POA: Diagnosis not present

## 2021-07-29 DIAGNOSIS — M25511 Pain in right shoulder: Secondary | ICD-10-CM | POA: Diagnosis not present

## 2021-07-29 DIAGNOSIS — S46811A Strain of other muscles, fascia and tendons at shoulder and upper arm level, right arm, initial encounter: Secondary | ICD-10-CM | POA: Diagnosis not present

## 2021-09-03 DIAGNOSIS — L249 Irritant contact dermatitis, unspecified cause: Secondary | ICD-10-CM | POA: Diagnosis not present

## 2021-09-03 DIAGNOSIS — L658 Other specified nonscarring hair loss: Secondary | ICD-10-CM | POA: Diagnosis not present

## 2021-09-03 DIAGNOSIS — L219 Seborrheic dermatitis, unspecified: Secondary | ICD-10-CM | POA: Diagnosis not present

## 2021-09-04 ENCOUNTER — Ambulatory Visit: Payer: BC Managed Care – PPO | Admitting: Physician Assistant

## 2021-09-04 ENCOUNTER — Ambulatory Visit: Payer: Self-pay

## 2021-09-04 ENCOUNTER — Other Ambulatory Visit: Payer: Self-pay

## 2021-09-04 DIAGNOSIS — M25511 Pain in right shoulder: Secondary | ICD-10-CM | POA: Diagnosis not present

## 2021-09-04 NOTE — Progress Notes (Signed)
Office Visit Note   Patient: Tammy Aguirre           Date of Birth: May 10, 1961           MRN: [id number] Visit Date: 09/04/2021              Requested by: Renford Dills, MD [address] [place],  [place] [address] PCP: Renford Dills, MD  Chief Complaint  Patient presents with   Right Shoulder - Pain      HPI: Patient is a pleasant right-hand-dominant 61 year old woman with a 1 month history of right shoulder pain.  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  Denies any fever or chills any neck pain.  She has tried over-the-counter anti-inflammatories.  She is having trouble sleeping when she rolls over onto this arm or changes positions.  She did see urgent care who gave her a muscle relaxer and some pain medicine without relief.  She has specific difficulty with overhead activities  Assessment & Plan: Visit Diagnoses:  1. Acute pain of right shoulder     Plan: She does have a little bit of arthritis in her shoulder but findings consistent with rotator cuff tendinitis we talked about the natural history of this.  Her strength is actually quite good though she is very painful especially going overhead and behind her back.  I recommended a cortisone injection today which she would like to go forward with.  I have given her some home exercises to do which she will begin doing for the next few weeks.  We will follow-up with Dr. Cleophas Dunker that she has seen him in the past in 3 weeks.  If she got no better we could consider an MRI or formal physical therapy  Follow-Up Instructions: No follow-ups on file.   Ortho Exam Patient appears well pleasant to examination appropriate to conversation.  Normal respiratory effort Patient is alert, oriented, no adenopathy, well-dressed, normal affect, normal respiratory effort. Examination of her right shoulder no obvious deformity or step-off.  She has pain with  forward elevation in the mid level.  She does go to 180 degrees.  But coming down is quite painful.  She does have positive empty can test impingement findings.  Positive speeds test.  She does not want to internally rotate behind her back because of pain.  She has good strength with resisted abduction abduction flexion extension of her arm  Imaging: XR Shoulder Right  Result Date: 09/04/2021 Radiographs of her right shoulder demonstrate humeral head is well reduced in the glenoid fossa.  She does have 6 some sclerotic changes with some subcondylar cysts.  Overall though good congruent joint space.  Some degenerative changes of the acromioclavicular joint.  No acute osseous injuries or fractures  No images are attached to the encounter.  Labs: No results found for: HGBA1C, ESRSEDRATE, CRP, LABURIC, REPTSTATUS, GRAMSTAIN, CULT, LABORGA   No results found for: ALBUMIN, PREALBUMIN, CBC  No results found for: MG No results found for: VD25OH  No results found for: PREALBUMIN CBC EXTENDED Latest Ref Rng & Units 09/13/2011 08/08/2009  WBC 4.0 - 10.5 K/uL 5.8 4.0  RBC 3.87 - 5.11 MIL/uL 3.95 4.13  HGB 12.0 - 15.0 g/dL 11.5(L) 12.6  HCT 36.0 - 46.0 % 33.8(L) 36.6  PLT 150 - 400 K/uL 190 175  NEUTROABS 1.7 - 7.7 K/uL 3.7 -  LYMPHSABS 0.7 - 4.0 K/uL 1.5 -  There is no height or weight on file to calculate BMI.  Orders:  Orders Placed This Encounter  Procedures   XR Shoulder Right   No orders of the defined types were placed in this encounter.    Procedures: No procedures performed  Clinical Data: No additional findings.  ROS:  All other systems negative, except as noted in the HPI. Review of Systems  Objective: Vital Signs: There were no vitals taken for this visit.  Specialty Comments:  No specialty comments available.  PMFS History: Patient Active Problem List   Diagnosis Date Noted   Anterior knee pain, right 10/09/2020   Overweight with body mass index (BMI)  25.0-29.9 10/20/2017   Endometriosis 10/20/2017   Hypertensive disorder 10/20/2017   Cyst of ovary 08/02/1996   Abnormal cervical Papanicolaou smear 08/03/1983   Past Medical History:  Diagnosis Date   Hypertension     No family history on file.  Past Surgical History:  Procedure Laterality Date   ABDOMINAL HYSTERECTOMY     CESAREAN SECTION     NECK SURGERY     Social History   Occupational History   Not on file  Tobacco Use   Smoking status: Never   Smokeless tobacco: Never  Substance and Sexual Activity   Alcohol use: No   Drug use: No   Sexual activity: Yes    Birth control/protection: Surgical

## 2021-09-24 ENCOUNTER — Encounter: Payer: Self-pay | Admitting: Orthopaedic Surgery

## 2021-09-24 ENCOUNTER — Ambulatory Visit: Payer: BC Managed Care – PPO | Admitting: Orthopaedic Surgery

## 2021-09-24 ENCOUNTER — Other Ambulatory Visit: Payer: Self-pay

## 2021-09-24 DIAGNOSIS — M25511 Pain in right shoulder: Secondary | ICD-10-CM | POA: Diagnosis not present

## 2021-09-24 NOTE — Progress Notes (Signed)
Office Visit Note   Patient: Tammy Aguirre           Date of Birth: 09-Aug-1960           MRN: [id number] Visit Date: 09/24/2021              Requested by: Tammy Carol, MD [address] [place],   [address] PCP: Tammy Carol, MD   Assessment & Plan: Visit Diagnoses:  1. Acute pain of right shoulder     Plan: Tammy Aguirre was seen by Tammy Aguirre recently for cute onset of right shoulder pain after using a rug shampoo or.  She performed a subacromial cortisone injection and she actually is feeling better.  She has not completely resolved most of her symptoms but definitely improved.  She is able to place her arm overhead and has minimal impingement with good strength.  She continue with her home exercises and try over-the-counter medicines and even Voltaren gel if she is not much better over the next 3 weeks it might be worth getting an MRI scan with the possibility of a small rotator cuff tear that she can simply call and we will set this up for her over the phone  Follow-Up Instructions: Return if symptoms worsen or fail to improve.   Orders:  No orders of the defined types were placed in this encounter.  No orders of the defined types were placed in this encounter.     Procedures: No procedures performed   Clinical Data: No additional findings.   Subjective: Chief Complaint  Patient presents with   Right Shoulder - Follow-up  Patient presents today for a three week follow up on her right shoulder. She said that she is improving. She has been doing home exercises. She takes Advil as needed.   HPI  Review of Systems   Objective: Vital Signs: There were no vitals taken for this visit.  Physical Exam Constitutional:      Appearance: She is well-developed.  Eyes:     Pupils: Pupils are equal, round, and reactive to light.  Pulmonary:     Effort: Pulmonary effort is normal.  Skin:    General: Skin is warm and dry.  Neurological:      Mental Status: She is alert and oriented to person, place, and time.  Psychiatric:        Behavior: Behavior normal.    Ortho Exam awake alert and oriented x3.  Comfortable sitting.  Able to place her right shoulder fully overhead with minimal discomfort.  Slight impingement pain on the extreme of internal/external rotation.  Negative empty can.  Negative speeds sign.  No loss of strength with internal extra rotation.  Very minimal anterior subacromial pain.  Biceps intact.  No crepitation  Specialty Comments:  No specialty comments available.  Imaging: No results found.   PMFS History: Patient Active Problem List   Diagnosis Date Noted   Pain in right shoulder 09/24/2021   Anterior knee pain, right 10/09/2020   Overweight with body mass index (BMI) 25.0-29.9 10/20/2017   Endometriosis 10/20/2017   Hypertensive disorder 10/20/2017   Cyst of ovary 08/02/1996   Abnormal cervical Papanicolaou smear 08/03/1983   Past Medical History:  Diagnosis Date   Hypertension     History reviewed. No pertinent family history.  Past Surgical History:  Procedure Laterality Date   ABDOMINAL HYSTERECTOMY     CESAREAN SECTION     NECK SURGERY     Social History  Occupational History   Not on file  Tobacco Use   Smoking status: Never   Smokeless tobacco: Never  Substance and Sexual Activity   Alcohol use: No   Drug use: No   Sexual activity: Yes    Birth control/protection: Surgical

## 2021-10-12 ENCOUNTER — Telehealth: Payer: Self-pay | Admitting: Orthopaedic Surgery

## 2021-10-12 NOTE — Telephone Encounter (Signed)
Pt called requesting a MRI referral be sent for right shoulder. Please call pt about this matter at [phone]. ?

## 2021-10-13 ENCOUNTER — Other Ambulatory Visit: Payer: Self-pay

## 2021-10-13 DIAGNOSIS — M25511 Pain in right shoulder: Secondary | ICD-10-CM

## 2021-10-13 NOTE — Telephone Encounter (Signed)
Called. LMOM that MRI has been ordered. ?

## 2021-10-13 NOTE — Telephone Encounter (Signed)
Ok to schedule.

## 2021-10-26 ENCOUNTER — Other Ambulatory Visit: Payer: Self-pay

## 2021-10-26 ENCOUNTER — Ambulatory Visit
Admission: RE | Admit: 2021-10-26 | Discharge: 2021-10-26 | Disposition: A | Discharge status: Home / Self Care | Payer: BC Managed Care – PPO | Source: Ambulatory Visit | Attending: Orthopaedic Surgery | Admitting: Orthopaedic Surgery

## 2021-10-26 DIAGNOSIS — M25511 Pain in right shoulder: Secondary | ICD-10-CM

## 2021-10-29 ENCOUNTER — Ambulatory Visit (INDEPENDENT_AMBULATORY_CARE_PROVIDER_SITE_OTHER): Payer: BC Managed Care – PPO | Admitting: Orthopaedic Surgery

## 2021-10-29 ENCOUNTER — Encounter: Payer: Self-pay | Admitting: Orthopaedic Surgery

## 2021-10-29 DIAGNOSIS — S46011D Strain of muscle(s) and tendon(s) of the rotator cuff of right shoulder, subsequent encounter: Secondary | ICD-10-CM | POA: Diagnosis not present

## 2021-10-29 DIAGNOSIS — M75121 Complete rotator cuff tear or rupture of right shoulder, not specified as traumatic: Secondary | ICD-10-CM | POA: Insufficient documentation

## 2021-10-29 NOTE — Progress Notes (Signed)
? ?Office Visit Note ?  ?Patient: Tammy Aguirre           ?Date of Birth: 07/30/1961           ?MRN: [id number] ?Visit Date: 10/29/2021 ?             ?Requested by: Tammy Dills, MD ?[address] ?[address] ?[place],  [place] [address] ?PCP: Tammy Dills, MD ? ? ?Assessment & Plan: ?Visit Diagnoses:  ?1. Traumatic complete tear of right rotator cuff, subsequent encounter   ? ? ?Plan: Tammy Aguirre has been experiencing right shoulder pain such as shampooing a rug several months ago.  She has had a subacromial cortisone injection with only temporary relief and doing exercises and relates that she still having trouble with raising her arm overhead related to pain and even sleeping on that side.  MRI scan was just recently performed demonstrating a full-thickness tear of the posterior supraspinatus measuring up to 10 mm anterior to posterior and up to 18 mm of retraction.  There was moderate to high-grade thinning of the anterior supraspinatus tendon fibers.  There was partial tearing of the infraspinatus tendon without retraction.  The subscapularis and teres minor were intact this was the long head of the biceps.  There was mild anterior supraspinatus atrophy as mentioned.  There were degenerative changes at the [facility] joint with narrowing and subchondral marrow edema with peripheral osteophyte formation with a type II acromion.  There was full-thickness cartilage loss within the mid glenoid and moderate thinning of the humeral head cartilage.  She was not experiencing any pain until this incident while she was shampooing a rug.  I think the majority of her pain is related to the rotator cuff.  She actually has good strength and does have full overhead motion.  I think the best approach would be an arthroscopic SA D, DCR and then mini open rotator cuff tear repair.  I think the biceps tendon is intact.  I do not think she is going to need a patch Long discussion regarding all of the above and she like to "think  about it.  I did discuss other alternatives including physical therapy but I think given the significant pathology surgery would be the best approach.  She will let me know.  Did answer all of her questions regarding the surgery including outlining the procedure and what she can expect postoperatively including potential complications ? ?Follow-Up Instructions: Return We will schedule rotator cuff tear repair.  ? ?Orders:  ?No orders of the defined types were placed in this encounter. ? ?No orders of the defined types were placed in this encounter. ? ? ? ? Procedures: ?No procedures performed ? ? ?Clinical Data: ?No additional findings. ? ? ?Subjective: ?Chief Complaint  ?Patient presents with  ? Right Shoulder - Follow-up  ?  MRI review  ?Patient presents today for follow up on her right shoulder. She had an MRI and is here today for those results. ? ?HPI ? ?Review of Systems ? ? ?Objective: ?Vital Signs: There were no vitals taken for this visit. ? ?Physical Exam ?Constitutional:   ?   Appearance: She is well-developed.  ?Eyes:  ?   Pupils: Pupils are equal, round, and reactive to light.  ?Pulmonary:  ?   Effort: Pulmonary effort is normal.  ?Skin: ?   General: Skin is warm and dry.  ?Neurological:  ?   Mental Status: She is alert and oriented to person, place, and time.  ?Psychiatric:     ?  Behavior: Behavior normal.  ? ? ?Ortho Exam awake alert and oriented x3.  Comfortable sitting.  Able to place right arm overhead with a circuitous arc of motion.  Positive impingement and minimally positive empty can testing.  Great strength.  Little bit of pain at the [facility] joint but more along the anterior subacromial region.  No crepitation.  Negative Speed sign.  Biceps intact ? ?Specialty Comments:  ?No specialty comments available. ? ?Imaging: ?No results found. ? ? ?PMFS History: ?Patient Active Problem List  ? Diagnosis Date Noted  ? Complete tear of right rotator cuff 10/29/2021  ? Pain in right shoulder 09/24/2021  ?  Anterior knee pain, right 10/09/2020  ? Overweight with body mass index (BMI) 25.0-29.9 10/20/2017  ? Endometriosis 10/20/2017  ? Hypertensive disorder 10/20/2017  ? Cyst of ovary 08/02/1996  ? Abnormal cervical Papanicolaou smear 08/03/1983  ? ?Past Medical History:  ?Diagnosis Date  ? Hypertension   ?  ?History reviewed. No pertinent family history.  ?Past Surgical History:  ?Procedure Laterality Date  ? ABDOMINAL HYSTERECTOMY    ? CESAREAN SECTION    ? NECK SURGERY    ? ?Social History  ? ?Occupational History  ? Not on file  ?Tobacco Use  ? Smoking status: Never  ? Smokeless tobacco: Never  ?Substance and Sexual Activity  ? Alcohol use: No  ? Drug use: No  ? Sexual activity: Yes  ?  Birth control/protection: Surgical  ? ? ? ? ? ? ?

## 2021-11-02 ENCOUNTER — Telehealth: Payer: Self-pay | Admitting: Orthopaedic Surgery

## 2021-11-02 NOTE — Telephone Encounter (Signed)
Pt is calling in to speak with the nurse. ? ?Please call  ?

## 2021-11-03 ENCOUNTER — Telehealth: Payer: Self-pay | Admitting: Orthopaedic Surgery

## 2021-11-03 NOTE — Telephone Encounter (Signed)
Patient left a message 11/02/21 asking if she was going to be able to travel post surgery? Patient has a trip planned in May. Patient has not scheduled surgery as of yet. Please call to advise. ?

## 2021-11-09 NOTE — Telephone Encounter (Signed)
called

## 2021-11-12 NOTE — Telephone Encounter (Signed)
Advised pt that the auth was approved per Eunice Blase --would like April to call her back to discuss options. ?

## 2021-11-13 ENCOUNTER — Telehealth: Payer: Self-pay | Admitting: Orthopaedic Surgery

## 2021-11-13 NOTE — Telephone Encounter (Signed)
Patient rescheduled surgery from 4/20 to 4/25, and would like to know if she will be able to travel still on 12/10/21. Patient spoke with Dr.Whitfield about this previously, and thought he had said he wanted her to have two weeks after surgery before traveling, but if she did not have surgery week of the 20th she would not be able to travel. Please call to clarify for patient.  ?

## 2021-11-13 NOTE — Telephone Encounter (Signed)
I spoke with patient. We rescheduled her surgery from [facility] to [facility] per patient request. All questions were answered.  ?

## 2021-11-16 NOTE — Telephone Encounter (Signed)
Pt would be OK to travel but will be in a sling

## 2021-11-16 NOTE — Telephone Encounter (Signed)
Called and notified patient. She states understanding and has no other questions.  ?

## 2021-11-17 ENCOUNTER — Other Ambulatory Visit: Payer: Self-pay

## 2021-11-17 ENCOUNTER — Telehealth: Payer: Self-pay | Admitting: Orthopaedic Surgery

## 2021-11-17 ENCOUNTER — Encounter (HOSPITAL_COMMUNITY): Payer: Self-pay | Admitting: Orthopaedic Surgery

## 2021-11-17 NOTE — Telephone Encounter (Signed)
Received $25.00 check,medical records release form and disability paperwork from patient/Forwarding to [employer] today ?

## 2021-11-17 NOTE — Progress Notes (Signed)
Spoke with April at Dr. Hoy Register office to request pre op orders in epic. ?

## 2021-11-17 NOTE — Progress Notes (Signed)
Attempted to obtain medical history via telephone, unable to reach at this time. I left a voicemail to return pre surgical testing department's phone call.  

## 2021-11-17 NOTE — Patient Instructions (Addendum)
DUE TO COVID-19 ONLY ONE VISITOR  (aged 60 and older)  IS ALLOWED TO COME WITH YOU AND STAY IN THE WAITING ROOM ONLY DURING PRE OP AND PROCEDURE.   ?**NO VISITORS ARE ALLOWED IN THE SHORT STAY AREA OR RECOVERY ROOM!!** ? ? ? Your procedure is scheduled on: Tuesday, November 24, 2021 ? ? Report to [facility] Main Entrance ? ?  Report to admitting at 5:15 AM ? ? Call this number if you have problems the morning of surgery [phone] ? ? Do not eat food :After Midnight. ? ? After Midnight you may have the following liquids until 4:430 AM DAY OF SURGERY ? ?Water ?Black Coffee (sugar ok, NO MILK/CREAM OR CREAMERS)  ?Tea (sugar ok, NO MILK/CREAM OR CREAMERS) regular and decaf                             ?Plain Jell-O (NO RED)                                           ?Fruit ices (not with fruit pulp, NO RED)                                     ?Popsicles (NO RED)                                                                  ?Juice: apple, WHITE grape, WHITE cranberry ?Sports drinks like Gatorade (NO RED) ?Clear broth(vegetable,chicken,beef)  ? ? ?  ?The day of surgery:  ?Drink ONE (1) Pre-Surgery Clear Ensure at 4:30 AM the morning of surgery. Drink in one sitting. Do not sip.  ?This drink was given to you during your hospital  ?pre-op appointment visit. ?Nothing else to drink after completing the  ?Pre-Surgery Clear Ensure. ?  ?       If you have questions, please contact your surgeon?s office. ?  ?Oral Hygiene is also important to reduce your risk of infection.                                    ?Remember - BRUSH YOUR TEETH THE MORNING OF SURGERY WITH YOUR REGULAR TOOTHPASTE ? ? Do NOT smoke after Midnight ? ? Take these medicines the morning of surgery with A SIP OF WATER: Amlodipine, Finasteride ? ?May use nasal spray morning of procedure ?                  ?           You may not have any metal on your body including hair pins, jewelry, and body piercing ? ?           Do not wear make-up, lotions,  powders, perfumes/cologne, or deodorant ? ?Do not wear nail polish including gel and S&S, artificial/acrylic nails, or any other type of covering on natural nails including finger and toenails. If you have artificial nails, gel coating, etc. that needs  to be removed by a nail salon please have this removed prior to surgery or surgery may need to be canceled/ delayed if the surgeon/ anesthesia feels like they are unable to be safely monitored.  ? ?Do not shave  48 hours prior to surgery.  ? ? Do not bring valuables to the hospital. [facility] IS NOT ?            RESPONSIBLE   FOR VALUABLES. ? ? Contacts, dentures or bridgework may not be worn into surgery. ?  ? Patients discharged on the day of surgery will not be allowed to drive home.  Someone NEEDS to stay with you for the first 24 hours after anesthesia. ? ? Special Instructions: Bring a copy of your healthcare power of attorney and living will documents         the day of surgery if you haven't scanned them before. ? ?            Please read over the following fact sheets you were given: IF YOU HAVE QUESTIONS ABOUT YOUR PRE-OP INSTRUCTIONS PLEASE CALL [phone] ? ?    - Preparing for Surgery ?Before surgery, you can play an important role.  Because skin is not sterile, your skin needs to be as free of germs as possible.  You can reduce the number of germs on your skin by washing with CHG (chlorahexidine gluconate) soap before surgery.  CHG is an antiseptic cleaner which kills germs and bonds with the skin to continue killing germs even after washing. ?Please DO NOT use if you have an allergy to CHG or antibacterial soaps.  If your skin becomes reddened/irritated stop using the CHG and inform your nurse when you arrive at Short Stay. ?Do not shave (including legs and underarms) for at least 48 hours prior to the first CHG shower.  You may shave your face/neck. ? ?Please follow these instructions carefully: ? 1.  Shower with CHG Soap the night  before surgery and the  morning of surgery. ? 2.  If you choose to wash your hair, wash your hair first as usual with your normal  shampoo. ? 3.  After you shampoo, rinse your hair and body thoroughly to remove the shampoo.                            ? 4.  Use CHG as you would any other liquid soap.  You can apply chg directly to the skin and wash.  Gently with a scrungie or clean washcloth. ? 5.  Apply the CHG Soap to your body ONLY FROM THE NECK DOWN.   Do   not use on face/ open      ?                     Wound or open sores. Avoid contact with eyes, ears mouth and   genitals (private parts).  ?                     Engineering geologistWash face,  Genitals (private parts) with your normal soap. ?            6.  Wash thoroughly, paying special attention to the area where your    surgery  will be performed. ? 7.  Thoroughly rinse your body with warm water from the neck down. ? 8.  DO NOT shower/wash with your normal soap after using and  rinsing off the CHG Soap. ?               9.  Pat yourself dry with a clean towel. ?           10.  Wear clean pajamas. ?           11.  Place clean sheets on your bed the night of your first shower and do not  sleep with pets. ?Day of Surgery : ?Do not apply any lotions/deodorants the morning of surgery.  Please wear clean clothes to the hospital/surgery center. ? ?FAILURE TO FOLLOW THESE INSTRUCTIONS MAY RESULT IN THE CANCELLATION OF YOUR SURGERY ? ?PATIENT SIGNATURE_________________________________ ? ?NURSE SIGNATURE__________________________________ ? ?________________________________________________________________________  ? ?Incentive Spirometer ?(Watch this video at home: [email or web address]) ? ?An incentive spirometer is a tool that can help keep your lungs clear and active. This tool measures how well you are filling your lungs with each breath. Taking long deep breaths may help reverse or decrease the chance of developing breathing (pulmonary) problems (especially  infection) following: ?A long period of time when you are unable to move or be active. ?BEFORE THE PROCEDURE  ?If the spirometer includes an indicator to show your best effort, your nurse or respiratory therapist will set it to a desired goal. ?If possible, sit up straight or lean slightly forward. Try not to slouch. ?Hold the incentive spirometer in an upright position. ?INSTRUCTIONS FOR USE  ?Sit on the edge of your bed if possible, or sit up as far as you can in bed or on a chair. ?Hold the incentive spirometer in an upright position. ?Breathe out normally. ?Place the mouthpiece in your mouth and seal your lips tightly around it. ?Breathe in slowly and as deeply as possible, raising the piston or the ball toward the top of the column. ?Hold your breath for 3-5 seconds or for as long as possible. Allow the piston or ball to fall to the bottom of the column. ?Remove the mouthpiece from your mouth and breathe out normally. ?Rest for a few seconds and repeat Steps 1 through 7 at least 10 times every 1-2 hours when you are awake. Take your time and take a few normal breaths between deep breaths. ?The spirometer may include an indicator to show your best effort. Use the indicator as a goal to work toward during each repetition. ?After each set of 10 deep breaths, practice coughing to be sure your lungs are clear. If you have an incision (the cut made at the time of surgery), support your incision when coughing by placing a pillow or rolled up towels firmly against it. ?Once you are able to get out of bed, walk around indoors and cough well. You may stop using the incentive spirometer when instructed by your caregiver.  ?RISKS AND COMPLICATIONS ?Take your time so you do not get dizzy or light-headed. ?If you are in pain, you may need to take or ask for pain medication before doing incentive spirometry. It is harder to take a deep breath if you are having pain. ?AFTER USE ?Rest and breathe slowly and easily. ?It can be  helpful to keep track of a log of your progress. Your caregiver can provide you with a simple table to help with this. ?If you are using the spirometer at home, follow these instructions: ?SEEK MEDICAL CARE IF:  ?You

## 2021-11-17 NOTE — Progress Notes (Addendum)
For Short Stay: ?COVID SWAB appointment date: N/A ?Date of COVID positive in last 90 days: N/A ? ?Bowel Prep reminder: N/A ? ? ?For Anesthesia: ?PCP - Renford Dills, MD ?Cardiologist - N/A ? ?Chest x-ray - greater than 1 year ?EKG - 11/20/21 in epic ?Stress Test - N/A ?ECHO - greater than 2 years ?Cardiac Cath - N/A ?Pacemaker/ICD device last checked: N/A ?Pacemaker orders received: N/A ?Device Rep notified: N/A ? ?Spinal Cord Stimulator:N/A ? ?Sleep Study -  N/A ?CPAP -  N/A ? ?Fasting Blood Sugar - N/A ?Checks Blood Sugar ___N/A__ times a day ?Date and result of last Hgb A1c-N/A ? ?Blood Thinner Instructions:N/A ?Aspirin Instructions:N/A ?Last Dose:N/A ? ?Activity level:  Able to exercise without chest pain and/or shortness of breath ? ?Anesthesia review: N/A ? ?Patient denies shortness of breath, fever, cough and chest pain at PAT appointment ? ? ?Patient verbalized understanding of instructions that were given to them at the PAT appointment. Patient was also instructed that they will need to review over the PAT instructions again at home before surgery.  ?

## 2021-11-17 NOTE — H&P (Signed)
Tammy Aguirre is an 61 y.o. female.   ?Chief Complaint: Right Shoulder Pain ?HPI:  ? Right Shoulder - Follow-up  ?    MRI review  ?Patient presents today for follow up on her right shoulder. She had an MRI and is here today for those results. ? ?Past Medical History:  ?Diagnosis Date  ? Anemia   ? History of  ? Arthritis   ? Right Shoulder  ? Hypertension   ? Migraines   ? History of  ? PONV (postoperative nausea and vomiting)   ? ? ?Past Surgical History:  ?Procedure Laterality Date  ? ABDOMINAL HYSTERECTOMY    ? CESAREAN SECTION    ? COLONOSCOPY    ? NECK SURGERY    ? plate in neck  ? UPPER GI ENDOSCOPY    ? ? ?No family history on file. ?Social History:  reports that she has never smoked. She has never used smokeless tobacco. She reports that she does not drink alcohol and does not use drugs. ? ?Allergies:  ?Allergies  ?Allergen Reactions  ? Tetracyclines & Related Rash  ? ? ?No medications prior to admission.  ? ? ?No results found for this or any previous visit (from the past 48 hour(s)). ?No results found. ? ?Review of Systems  ?All other systems reviewed and are negative. ? ?Height 5\' 1"  (1.549 m), weight 57.2 kg. ?Physical Exam  ?Ortho Exam awake alert and oriented x3.  Comfortable sitting.  Able to place right arm overhead with a circuitous arc of motion.  Positive impingement and minimally positive empty can testing.  Great strength.  Little bit of pain at the [facility] joint but more along the anterior subacromial region.  No crepitation.  Negative Speed sign.  Biceps intact. Heart RRR Lungs Clear ?Assessment/Plan ?Plan: [name] has been experiencing right shoulder pain such as shampooing a rug several months ago.  She has had a subacromial cortisone injection with only temporary relief and doing exercises and relates that she still having trouble with raising her arm overhead related to pain and even sleeping on that side.  MRI scan was just recently performed demonstrating a full-thickness tear of the  posterior supraspinatus measuring up to 10 mm anterior to posterior and up to 18 mm of retraction.  There was moderate to high-grade thinning of the anterior supraspinatus tendon fibers.  There was partial tearing of the infraspinatus tendon without retraction.  The subscapularis and teres minor were intact this was the long head of the biceps.  There was mild anterior supraspinatus atrophy as mentioned.  There were degenerative changes at the [facility] joint with narrowing and subchondral marrow edema with peripheral osteophyte formation with a type II acromion.  There was full-thickness cartilage loss within the mid glenoid and moderate thinning of the humeral head cartilage.  She was not experiencing any pain until this incident while she was shampooing a rug.  I think the majority of her pain is related to the rotator cuff.  She actually has good strength and does have full overhead motion.  I think the best approach would be an arthroscopic SA D, DCR and then mini open rotator cuff tear repair.  I think the biceps tendon is intact.  I do not think she is going to need a patch Long discussion regarding all of the above and she like to "think about it.  I did discuss other alternatives including physical therapy but I think given the significant pathology surgery would be the best approach.  She will let me know.  Did answer all of her questions regarding the surgery including outlining the procedure and what she can expect postoperatively including potential complications ? ?[place] Ashwika Freels, PA ?11/17/2021, 1:25 PM ? ? ? ?

## 2021-11-20 ENCOUNTER — Encounter (HOSPITAL_COMMUNITY)
Admission: RE | Admit: 2021-11-20 | Discharge: 2021-11-20 | Disposition: A | Discharge status: Home / Self Care | Payer: BC Managed Care – PPO | Source: Ambulatory Visit | Attending: Orthopaedic Surgery | Admitting: Orthopaedic Surgery

## 2021-11-20 ENCOUNTER — Other Ambulatory Visit: Payer: Self-pay

## 2021-11-20 DIAGNOSIS — I10 Essential (primary) hypertension: Secondary | ICD-10-CM | POA: Diagnosis not present

## 2021-11-20 DIAGNOSIS — Z01818 Encounter for other preprocedural examination: Secondary | ICD-10-CM | POA: Diagnosis not present

## 2021-11-20 LAB — BASIC METABOLIC PANEL
Anion gap: 8 (ref 5–15)
BUN: 22 mg/dL — ABNORMAL HIGH (ref 6–20)
CO2: 25 mmol/L (ref 22–32)
Calcium: 9.3 mg/dL (ref 8.9–10.3)
Chloride: 105 mmol/L (ref 98–111)
Creatinine, Ser: 0.93 mg/dL (ref 0.44–1.00)
GFR, Estimated: >60 mL/min (ref >60)
Glucose, Bld: 89 mg/dL (ref 70–99)
Potassium: 3.5 mmol/L (ref 3.5–5.1)
Sodium: 138 mmol/L (ref 135–145)

## 2021-11-20 LAB — CBC
HCT: 36.1 % (ref 36.0–46.0)
Hemoglobin: 11.8 g/dL — ABNORMAL LOW (ref 12.0–15.0)
MCH: 29.3 pg (ref 26.0–34.0)
MCHC: 32.7 g/dL (ref 30.0–36.0)
MCV: 89.6 fL (ref 80.0–100.0)
Platelets: 201 10*3/uL (ref 150–400)
RBC: 4.03 MIL/uL (ref 3.87–5.11)
RDW: 13.3 % (ref 11.5–15.5)
WBC: 5.7 10*3/uL (ref 4.0–10.5)
nRBC: 0 % (ref 0.0–0.2)

## 2021-11-23 NOTE — Anesthesia Preprocedure Evaluation (Addendum)
Anesthesia Evaluation  ?Patient identified by MRN, date of birth, ID band ?Patient awake ? ? ? ?Reviewed: ?Allergy & Precautions, NPO status , Patient's Chart, lab work & pertinent test results ? ?History of Anesthesia Complications ?(+) PONV and history of anesthetic complications ? ?Airway ?Mallampati: II ? ?TM Distance: >3 FB ?Neck ROM: Full ? ? ? Dental ? ?(+) Teeth Intact, Dental Advisory Given ?  ?Pulmonary ?neg pulmonary ROS,  ?  ?Pulmonary exam normal ?breath sounds clear to auscultation ? ? ? ? ? ? Cardiovascular ?hypertension, Pt. on medications ?Normal cardiovascular exam ?Rhythm:Regular Rate:Normal ? ? ?  ?Neuro/Psych ? Headaches, TIAnegative psych ROS  ? GI/Hepatic ?negative GI ROS, Neg liver ROS,   ?Endo/Other  ?negative endocrine ROS ? Renal/GU ?negative Renal ROS  ? ?  ?Musculoskeletal ? ?(+) Arthritis , RIGHT SHOULDER IMPINGEMENT SYNDROME, ROTATOR CUFF TEAR  ? Abdominal ?  ?Peds ? Hematology ? ?(+) Blood dyscrasia, anemia ,   ?Anesthesia Other Findings ? ? Reproductive/Obstetrics ? ?  ? ? ? ? ? ? ? ? ? ? ? ? ? ?  ?  ? ? ? ? ? ? ? ?Anesthesia Physical ?Anesthesia Plan ? ?ASA: 3 ? ?Anesthesia Plan: General  ? ?Post-op Pain Management: Regional block* and Tylenol PO (pre-op)*  ? ?Induction: Intravenous ? ?PONV Risk Score and Plan: 4 or greater and Midazolam, Dexamethasone, Ondansetron and Scopolamine patch - Pre-op ? ?Airway Management Planned: Oral ETT ? ?Additional Equipment:  ? ?Intra-op Plan:  ? ?Post-operative Plan: Extubation in OR ? ?Informed Consent: I have reviewed the patients History and Physical, chart, labs and discussed the procedure including the risks, benefits and alternatives for the proposed anesthesia with the patient or authorized representative who has indicated his/her understanding and acceptance.  ? ? ? ?Dental advisory given ? ?Plan Discussed with: CRNA ? ?Anesthesia Plan Comments:   ? ? ? ? ? ?Anesthesia Quick Evaluation ? ?

## 2021-11-24 ENCOUNTER — Other Ambulatory Visit: Payer: Self-pay | Admitting: Orthopaedic Surgery

## 2021-11-24 ENCOUNTER — Other Ambulatory Visit: Payer: Self-pay

## 2021-11-24 ENCOUNTER — Ambulatory Visit (HOSPITAL_COMMUNITY): Payer: BC Managed Care – PPO | Admitting: Certified Registered"

## 2021-11-24 ENCOUNTER — Ambulatory Visit (HOSPITAL_COMMUNITY)
Admission: RE | Admit: 2021-11-24 | Discharge: 2021-11-24 | Disposition: A | Discharge status: Home / Self Care | Payer: BC Managed Care – PPO | Attending: Orthopaedic Surgery | Admitting: Orthopaedic Surgery

## 2021-11-24 ENCOUNTER — Encounter (HOSPITAL_COMMUNITY)
Admission: RE | Disposition: A | Discharge status: Home / Self Care | Payer: Self-pay | Source: Home / Self Care | Attending: Orthopaedic Surgery

## 2021-11-24 ENCOUNTER — Encounter (HOSPITAL_COMMUNITY): Payer: Self-pay | Admitting: Orthopaedic Surgery

## 2021-11-24 DIAGNOSIS — M7541 Impingement syndrome of right shoulder: Secondary | ICD-10-CM | POA: Insufficient documentation

## 2021-11-24 DIAGNOSIS — M7521 Bicipital tendinitis, right shoulder: Secondary | ICD-10-CM

## 2021-11-24 DIAGNOSIS — I10 Essential (primary) hypertension: Secondary | ICD-10-CM | POA: Diagnosis not present

## 2021-11-24 DIAGNOSIS — Z79899 Other long term (current) drug therapy: Secondary | ICD-10-CM | POA: Diagnosis not present

## 2021-11-24 DIAGNOSIS — M75101 Unspecified rotator cuff tear or rupture of right shoulder, not specified as traumatic: Secondary | ICD-10-CM | POA: Insufficient documentation

## 2021-11-24 DIAGNOSIS — M75121 Complete rotator cuff tear or rupture of right shoulder, not specified as traumatic: Secondary | ICD-10-CM

## 2021-11-24 DIAGNOSIS — Z8673 Personal history of transient ischemic attack (TIA), and cerebral infarction without residual deficits: Secondary | ICD-10-CM | POA: Diagnosis not present

## 2021-11-24 DIAGNOSIS — G8918 Other acute postprocedural pain: Secondary | ICD-10-CM | POA: Diagnosis not present

## 2021-11-24 DIAGNOSIS — M19011 Primary osteoarthritis, right shoulder: Secondary | ICD-10-CM

## 2021-11-24 HISTORY — DX: Unspecified osteoarthritis, unspecified site: M19.90

## 2021-11-24 HISTORY — DX: Anemia, unspecified: D64.9

## 2021-11-24 HISTORY — DX: Nausea with vomiting, unspecified: R11.2

## 2021-11-24 HISTORY — DX: Personal history of transient ischemic attack (TIA), and cerebral infarction without residual deficits: Z86.73

## 2021-11-24 HISTORY — DX: Migraine, unspecified, not intractable, without status migrainosus: G43.909

## 2021-11-24 HISTORY — DX: Other specified postprocedural states: Z98.890

## 2021-11-24 HISTORY — PX: SHOULDER ARTHROSCOPY WITH OPEN ROTATOR CUFF REPAIR AND DISTAL CLAVICLE ACROMINECTOMY: SHX5683

## 2021-11-24 SURGERY — SHOULDER ARTHROSCOPY WITH OPEN ROTATOR CUFF REPAIR AND DISTAL CLAVICLE ACROMINECTOMY
Anesthesia: General | Laterality: Right

## 2021-11-24 MED ORDER — SODIUM CHLORIDE 0.9 % IR SOLN
Status: DC | PRN
  Administered 2021-11-24: 3000 mL

## 2021-11-24 MED ORDER — PROPOFOL 500 MG/50ML IV EMUL
INTRAVENOUS | Status: DC | PRN
  Administered 2021-11-24: 125 ug/kg/min via INTRAVENOUS

## 2021-11-24 MED ORDER — FENTANYL CITRATE (PF) 100 MCG/2ML IJ SOLN
INTRAMUSCULAR | Status: AC
  Filled 2021-11-24: qty 2

## 2021-11-24 MED ORDER — PROPOFOL 10 MG/ML IV BOLUS
INTRAVENOUS | Status: DC | PRN
  Administered 2021-11-24: 90 mg via INTRAVENOUS

## 2021-11-24 MED ORDER — ACETAMINOPHEN 500 MG PO TABS
1000.0000 mg | ORAL_TABLET | Freq: Once | ORAL | Status: AC
  Administered 2021-11-24: 1000 mg via ORAL
  Filled 2021-11-24: qty 2

## 2021-11-24 MED ORDER — LIDOCAINE 2% (20 MG/ML) 5 ML SYRINGE
INTRAMUSCULAR | Status: DC | PRN
  Administered 2021-11-24: 60 mg via INTRAVENOUS

## 2021-11-24 MED ORDER — MIDAZOLAM HCL 2 MG/2ML IJ SOLN
INTRAMUSCULAR | Status: AC
  Filled 2021-11-24: qty 2

## 2021-11-24 MED ORDER — MIDAZOLAM HCL 5 MG/5ML IJ SOLN
INTRAMUSCULAR | Status: DC | PRN
  Administered 2021-11-24: 1 mg via INTRAVENOUS

## 2021-11-24 MED ORDER — PHENYLEPHRINE 80 MCG/ML (10ML) SYRINGE FOR IV PUSH (FOR BLOOD PRESSURE SUPPORT)
PREFILLED_SYRINGE | INTRAVENOUS | Status: DC | PRN
  Administered 2021-11-24: 80 ug via INTRAVENOUS
  Administered 2021-11-24 (×2): 160 ug via INTRAVENOUS

## 2021-11-24 MED ORDER — BUPIVACAINE HCL (PF) 0.5 % IJ SOLN
INTRAMUSCULAR | Status: DC | PRN
  Administered 2021-11-24: 10 mL via PERINEURAL

## 2021-11-24 MED ORDER — LACTATED RINGERS IV SOLN: INTRAVENOUS | Status: DC

## 2021-11-24 MED ORDER — ONDANSETRON HCL 4 MG/2ML IJ SOLN
INTRAMUSCULAR | Status: AC
  Filled 2021-11-24: qty 2

## 2021-11-24 MED ORDER — CEFAZOLIN SODIUM-DEXTROSE 2-4 GM/100ML-% IV SOLN
2.0000 g | INTRAVENOUS | Status: AC
  Administered 2021-11-24: 2 g via INTRAVENOUS
  Filled 2021-11-24: qty 100

## 2021-11-24 MED ORDER — FENTANYL CITRATE (PF) 100 MCG/2ML IJ SOLN
INTRAMUSCULAR | Status: DC | PRN
  Administered 2021-11-24: 50 ug via INTRAVENOUS

## 2021-11-24 MED ORDER — BUPIVACAINE LIPOSOME 1.3 % IJ SUSP
INTRAMUSCULAR | Status: DC | PRN
  Administered 2021-11-24: 10 mL via PERINEURAL

## 2021-11-24 MED ORDER — PHENYLEPHRINE HCL (PRESSORS) 10 MG/ML IV SOLN
INTRAVENOUS | Status: AC
  Filled 2021-11-24: qty 1

## 2021-11-24 MED ORDER — 0.9 % SODIUM CHLORIDE (POUR BTL) OPTIME
TOPICAL | Status: DC | PRN
  Administered 2021-11-24: 1000 mL

## 2021-11-24 MED ORDER — ONDANSETRON HCL 4 MG/2ML IJ SOLN: 4.0000 mg | Freq: Once | INTRAMUSCULAR | Status: DC | PRN

## 2021-11-24 MED ORDER — LIDOCAINE HCL (PF) 2 % IJ SOLN
INTRAMUSCULAR | Status: AC
  Filled 2021-11-24: qty 5

## 2021-11-24 MED ORDER — BUPIVACAINE-EPINEPHRINE 0.25% -1:200000 IJ SOLN
INTRAMUSCULAR | Status: DC | PRN
  Administered 2021-11-24: 25 mL

## 2021-11-24 MED ORDER — FENTANYL CITRATE PF 50 MCG/ML IJ SOSY: 25.0000 ug | PREFILLED_SYRINGE | INTRAMUSCULAR | Status: DC | PRN

## 2021-11-24 MED ORDER — BUPIVACAINE-EPINEPHRINE (PF) 0.25% -1:200000 IJ SOLN
INTRAMUSCULAR | Status: AC
  Filled 2021-11-24: qty 30

## 2021-11-24 MED ORDER — DEXAMETHASONE SODIUM PHOSPHATE 10 MG/ML IJ SOLN
INTRAMUSCULAR | Status: DC | PRN
  Administered 2021-11-24: 8 mg via INTRAVENOUS

## 2021-11-24 MED ORDER — OXYCODONE-ACETAMINOPHEN 5-325 MG PO TABS: 1.0000 | ORAL_TABLET | ORAL | 0 refills | Status: DC | PRN

## 2021-11-24 MED ORDER — ORAL CARE MOUTH RINSE: 15.0000 mL | Freq: Once | OROMUCOSAL | Status: AC

## 2021-11-24 MED ORDER — CHLORHEXIDINE GLUCONATE 0.12 % MT SOLN
15.0000 mL | Freq: Once | OROMUCOSAL | Status: AC
  Administered 2021-11-24: 15 mL via OROMUCOSAL

## 2021-11-24 MED ORDER — PROPOFOL 10 MG/ML IV BOLUS
INTRAVENOUS | Status: AC
  Filled 2021-11-24: qty 20

## 2021-11-24 MED ORDER — SUGAMMADEX SODIUM 200 MG/2ML IV SOLN
INTRAVENOUS | Status: DC | PRN
  Administered 2021-11-24: 100 mg via INTRAVENOUS

## 2021-11-24 MED ORDER — ONDANSETRON HCL 4 MG/2ML IJ SOLN
INTRAMUSCULAR | Status: DC | PRN
  Administered 2021-11-24: 4 mg via INTRAVENOUS

## 2021-11-24 MED ORDER — ROCURONIUM BROMIDE 100 MG/10ML IV SOLN
INTRAVENOUS | Status: DC | PRN
  Administered 2021-11-24: 50 mg via INTRAVENOUS

## 2021-11-24 MED ORDER — PHENYLEPHRINE HCL-NACL 20-0.9 MG/250ML-% IV SOLN
INTRAVENOUS | Status: DC | PRN
Start: 2021-11-24 — End: 2021-11-24
  Administered 2021-11-24: 40 ug/min via INTRAVENOUS

## 2021-11-24 MED ORDER — ROCURONIUM BROMIDE 10 MG/ML (PF) SYRINGE
PREFILLED_SYRINGE | INTRAVENOUS | Status: AC
  Filled 2021-11-24: qty 10

## 2021-11-24 MED ORDER — SCOPOLAMINE 1 MG/3DAYS TD PT72
1.0000 | MEDICATED_PATCH | Freq: Once | TRANSDERMAL | Status: DC
  Administered 2021-11-24: 1.5 mg via TRANSDERMAL
  Filled 2021-11-24: qty 1

## 2021-11-24 SURGICAL SUPPLY — 67 items
AID PSTN UNV HD RSTRNT DISP (MISCELLANEOUS) ×1
ANCH SUT 5.5 1 ROW 2 STRN (Anchor) ×1 IMPLANT
ANCHOR SUT QUATTRO X2 5.5 (Anchor) ×1 IMPLANT
APL SKNCLS STERI-STRIP NONHPOA (GAUZE/BANDAGES/DRESSINGS) ×1
BAG COUNTER SPONGE SURGICOUNT (BAG) IMPLANT
BAG DECANTER FOR FLEXI CONT (MISCELLANEOUS) IMPLANT
BAG SPNG CNTER NS LX DISP (BAG)
BENZOIN TINCTURE PRP APPL 2/3 (GAUZE/BANDAGES/DRESSINGS) ×1 IMPLANT
BLADE AVERAGE 25X9 (BLADE) IMPLANT
BLADE EXCALIBUR 4.0X13 (MISCELLANEOUS) IMPLANT
BLADE SURG SZ11 CARB STEEL (BLADE) ×3 IMPLANT
BURR OVAL 8 FLU 5.0X13 (MISCELLANEOUS) ×3 IMPLANT
CANNULA 5.75X71 LONG (CANNULA) IMPLANT
CANNULA ACUFLEX KIT 5X76 (CANNULA) ×3 IMPLANT
CLSR STERI-STRIP ANTIMIC 1/2X4 (GAUZE/BANDAGES/DRESSINGS) ×1 IMPLANT
DISSECTOR 4.0MM X 13CM (MISCELLANEOUS) ×2 IMPLANT
DRAPE IMP U-DRAPE 54X76 (DRAPES) ×3 IMPLANT
DRAPE SHOULDER BEACH CHAIR (DRAPES) ×3 IMPLANT
DRAPE SURG 17X23 STRL (DRAPES) ×3 IMPLANT
DRAPE U-SHAPE 47X51 STRL (DRAPES) ×3 IMPLANT
DRSG EMULSION OIL 3X3 NADH (GAUZE/BANDAGES/DRESSINGS) ×6 IMPLANT
DRSG PAD ABDOMINAL 8X10 ST (GAUZE/BANDAGES/DRESSINGS) ×5 IMPLANT
DURAPREP 26ML APPLICATOR (WOUND CARE) ×3 IMPLANT
ELECT NDL TIP 2.8 STRL (NEEDLE) IMPLANT
ELECT NEEDLE TIP 2.8 STRL (NEEDLE) IMPLANT
ELECT REM PT RETURN 15FT ADLT (MISCELLANEOUS) ×3 IMPLANT
GAUZE 4X4 16PLY ~~LOC~~+RFID DBL (SPONGE) IMPLANT
GAUZE SPONGE 4X4 12PLY STRL (GAUZE/BANDAGES/DRESSINGS) ×3 IMPLANT
GLOVE BIO SURGEON STRL SZ8.5 (GLOVE) ×3 IMPLANT
GLOVE BIOGEL PI IND STRL 8 (GLOVE) ×2 IMPLANT
GLOVE BIOGEL PI IND STRL 8.5 (GLOVE) ×2 IMPLANT
GLOVE BIOGEL PI INDICATOR 8 (GLOVE) ×1
GLOVE BIOGEL PI INDICATOR 8.5 (GLOVE) ×1
GLOVE ECLIPSE 8.0 STRL XLNG CF (GLOVE) ×6 IMPLANT
GOWN STRL REUS W/ TWL XL LVL3 (GOWN DISPOSABLE) IMPLANT
GOWN STRL REUS W/TWL 2XL LVL3 (GOWN DISPOSABLE) ×3 IMPLANT
GOWN STRL REUS W/TWL XL LVL3 (GOWN DISPOSABLE)
KIT BASIN OR (CUSTOM PROCEDURE TRAY) ×3 IMPLANT
KIT TURNOVER KIT A (KITS) IMPLANT
MANIFOLD NEPTUNE II (INSTRUMENTS) IMPLANT
NDL 1/2 CIR CATGUT .05X1.09 (NEEDLE) IMPLANT
NDL SCORPION MULTI FIRE (NEEDLE) IMPLANT
NDL SPNL 18GX3.5 QUINCKE PK (NEEDLE) ×2 IMPLANT
NEEDLE 1/2 CIR CATGUT .05X1.09 (NEEDLE) ×2 IMPLANT
NEEDLE SCORPION MULTI FIRE (NEEDLE) IMPLANT
NEEDLE SPNL 18GX3.5 QUINCKE PK (NEEDLE) ×2 IMPLANT
NS IRRIG 1000ML POUR BTL (IV SOLUTION) IMPLANT
PACK SHOULDER (CUSTOM PROCEDURE TRAY) ×3 IMPLANT
PORT APPOLLO RF 90DEGREE MULTI (SURGICAL WAND) ×3 IMPLANT
RESTRAINT HEAD UNIVERSAL NS (MISCELLANEOUS) ×3 IMPLANT
SLING ARM IMMOBILIZER MED (SOFTGOODS) ×1 IMPLANT
SPIKE FLUID TRANSFER (MISCELLANEOUS) IMPLANT
SPONGE T-LAP 4X18 ~~LOC~~+RFID (SPONGE) ×6 IMPLANT
STAPLER VISISTAT 35W (STAPLE) IMPLANT
STRIP CLOSURE SKIN 1/2X4 (GAUZE/BANDAGES/DRESSINGS) IMPLANT
SUCTION FRAZIER HANDLE 10FR (MISCELLANEOUS)
SUCTION TUBE FRAZIER 10FR DISP (MISCELLANEOUS) IMPLANT
SUT ETHIBOND 0 (SUTURE) ×4 IMPLANT
SUT ETHILON 3 0 PS 1 (SUTURE) IMPLANT
SUT MNCRL AB 3-0 PS2 18 (SUTURE) ×3 IMPLANT
SUT MNCRL AB 4-0 PS2 18 (SUTURE) ×2 IMPLANT
SUT VIC AB 2-0 SH 27 (SUTURE) ×2
SUT VIC AB 2-0 SH 27XBRD (SUTURE) ×2 IMPLANT
SUT VICRYL 0 UR6 27IN ABS (SUTURE) ×1 IMPLANT
TUBING ARTHROSCOPY IRRIG 16FT (MISCELLANEOUS) ×3 IMPLANT
WATER STERILE IRR 1000ML POUR (IV SOLUTION) ×3 IMPLANT
YANKAUER SUCT BULB TIP NO VENT (SUCTIONS) IMPLANT

## 2021-11-24 NOTE — Anesthesia Procedure Notes (Signed)
Procedure Name: Intubation ?Date/Time: 11/24/2021 7:46 AM ?Performed by: Marny Lowenstein, CRNA ?Pre-anesthesia Checklist: Patient identified, Emergency Drugs available, Suction available and Patient being monitored ?Patient Re-evaluated:Patient Re-evaluated prior to induction ?Oxygen Delivery Method: Circle system utilized ?Preoxygenation: Pre-oxygenation with 100% oxygen ?Induction Type: IV induction and Cricoid Pressure applied ?Ventilation: Mask ventilation without difficulty ?Laryngoscope Size: Hyacinth Meeker and 2 ?Grade View: Grade III ?Tube type: Oral ?Tube size: 6.5 mm ?Number of attempts: 1 ?Airway Equipment and Method: Patient positioned with wedge pillow and Stylet ?Placement Confirmation: ETT inserted through vocal cords under direct vision, positive ETCO2 and breath sounds checked- equal and bilateral ?Secured at: 20 cm ?Tube secured with: Tape ?Dental Injury: Teeth and Oropharynx as per pre-operative assessment  ?Comments: Pt with decreased ROM of neck; grade 3 without cricoid, grade 2b with cricoid. ? ? ? ? ?

## 2021-11-24 NOTE — H&P (Signed)
The recent History & Physical has been reviewed. ?I have personally examined the patient today. ?There is no interval change to the documented History & Physical. ?The patient would like to proceed with the procedure. ? ?Valeria Batman ?11/24/2021,  7:26 AM ?  ? ?

## 2021-11-24 NOTE — Anesthesia Procedure Notes (Signed)
Anesthesia Regional Block: Interscalene brachial plexus block  ? ?Pre-Anesthetic Checklist: , timeout performed,  Correct Patient, Correct Site, Correct Laterality,  Correct Procedure, Correct Position, site marked,  Risks and benefits discussed,  Surgical consent,  Pre-op evaluation,  At surgeon's request and post-op pain management ? ?Laterality: Right ? ?Prep: chloraprep     ?  ?Needles:  ?Injection technique: Single-shot ? ?Needle Type: Echogenic Needle   ? ? ?Needle Length: 9cm  ?Needle Gauge: 21  ? ? ? ?Additional Needles: ? ? ?Procedures:,,,, ultrasound used (permanent image in chart),,    ?Narrative:  ?Start time: 11/24/2021 6:55 AM ?End time: 11/24/2021 7:01 AM ?Injection made incrementally with aspirations every 5 mL. ? ?Performed by: Personally  ?Anesthesiologist: Santa Lighter, MD ? ?Additional Notes: ?No pain on injection. No increased resistance to injection. Injection made in 5cc increments.  Good needle visualization.  Patient tolerated procedure well. ? ? ? ? ?

## 2021-11-24 NOTE — Op Note (Signed)
PATIENT ID:      Tammy Aguirre  ?MRN:     [id number] ?DOB/AGE:    60/26/1962 / 60 y.o. ? ? ?    OPERATIVE REPORT  ? ? DATE OF PROCEDURE:  11/24/2021   ?   ? PREOPERATIVE DIAGNOSIS:   RIGHT SHOULDER IMPINGEMENT SYNDROME, ROTATOR CUFF TEAR,DEGENERATIVE ARTHRITIS A-C JOINT ?                                                      Estimated body mass index is 24.3 kg/m? as calculated from the following: ?  Height as of this encounter: 5\' 1"  (1.549 m). ?  Weight as of this encounter: 58.3 kg. ?   ? POSTOPERATIVE DIAGNOSIS:   RIGHT SHOULDER IMPINGEMENT SYNDROME, ROTATOR CUFF TEAR ,ARTHRITIS A-C JOINT,TEAR LONG HEAD BICEPS TENDON                                                                   Estimated body mass index is 24.3 kg/m? as calculated from the following: ?  Height as of this encounter: 5\' 1"  (1.549 m). ?  Weight as of this encounter: 58.3 kg.   ? ? PROCEDURE:  Procedure(s): ?RIGHT SHOULDER ARTHROSCOPIC SUBACROMIAL DECOMPRESSION ,DISTAL CLAVICLE RESECTION WITH MINI OPEN ROTATOR CUFF REPAIR AND BICEPS TENODESIS  ?   ? SURGEON:  Joni Fears, MD  ? ? ASSISTANT:   Biagio Borg, PA-C   (Present and scrubbed throughout the case, critical for assistance with exposure, retraction, instrumentation, and closure.)      ?   ? ANESTHESIA: regional and general ?   ? DRAINS: none :    ? ? TOURNIQUET TIME: * No tourniquets in log *  ? ? COMPLICATIONS:  None  ? ?CONDITION:  stable ? ?PROCEDURE IN DETAIL: [id number] ? ? ?[name] ?11/24/2021, 9:07 AM ?  ?

## 2021-11-24 NOTE — Anesthesia Postprocedure Evaluation (Signed)
Anesthesia Post Note ? ?Patient: Tammy Aguirre ? ?Procedure(s) Performed: RIGHT SHOULDER ARTHROSCOPIC SUBACROMIAL DECOMPRESSION WITH OPEN ROTATOR CUFF REPAIR AND DISTAL CLAVICLE ACROMINECTOMY (Right) ? ?  ? ?Patient location during evaluation: PACU ?Anesthesia Type: General ?Level of consciousness: awake and alert ?Pain management: pain level controlled ?Vital Signs Assessment: post-procedure vital signs reviewed and stable ?Respiratory status: spontaneous breathing, nonlabored ventilation, respiratory function stable and patient connected to nasal cannula oxygen ?Cardiovascular status: blood pressure returned to baseline and stable ?Postop Assessment: no apparent nausea or vomiting ?Anesthetic complications: no ? ? ?No notable events documented. ? ?Last Vitals:  ?Vitals:  ? 11/24/21 1045 11/24/21 1100  ?BP: 130/63 116/76  ?Pulse: 80 74  ?Resp: 20   ?Temp:    ?SpO2: 94% 95%  ?  ?Last Pain:  ?Vitals:  ? 11/24/21 1045  ?PainSc: 0-No pain  ? ? ?  ?  ?  ?  ?  ?  ? ?Collene Schlichter ? ? ? ? ?

## 2021-11-24 NOTE — Transfer of Care (Signed)
Immediate Anesthesia Transfer of Care Note ? ?Patient: Tammy Aguirre ? ?Procedure(s) Performed: RIGHT SHOULDER ARTHROSCOPIC SUBACROMIAL DECOMPRESSION WITH OPEN ROTATOR CUFF REPAIR AND DISTAL CLAVICLE ACROMINECTOMY (Right) ? ?Patient Location: PACU ? ?Anesthesia Type:General and Regional ? ?Level of Consciousness: awake and patient cooperative ? ?Airway & Oxygen Therapy: Patient Spontanous Breathing and Patient connected to face mask oxygen ? ?Post-op Assessment: Report given to RN and Post -op Vital signs reviewed and stable ? ?Post vital signs: Reviewed and stable ? ?Last Vitals:  ?Vitals Value Taken Time  ?BP 88/56 11/24/21 0938  ?Temp    ?Pulse 71 11/24/21 0944  ?Resp 17 11/24/21 0944  ?SpO2 99 % 11/24/21 0944  ?Vitals shown include unvalidated device data. ? ?Last Pain:  ?Vitals:  ? 11/24/21 0559  ?PainSc: 4   ?   ? ?Patients Stated Pain Goal: 3 (11/24/21 0559) ? ?Complications: No notable events documented. ?

## 2021-11-24 NOTE — Op Note (Signed)
NAME: Tammy Aguirre, Tammy Aguirre. ?MEDICAL RECORD NO: [id number] ?ACCOUNT NO: [id number] ?DATE OF BIRTH: [birth year] ?FACILITY: [facility] ?LOCATION: [facility] ?PHYSICIAN: Vonna Kotyk. Durward Fortes, MD ? ?Operative Report  ? ?DATE OF PROCEDURE: 11/24/2021 ? ?PREOPERATIVE DIAGNOSES:  ?1.  Impingement syndrome, right shoulder. ?2.  Degenerative arthritis, acromioclavicular joint.   ?3.  Rotator cuff tear involving supraspinatus tendon. ? ?POSTOPERATIVE DIAGNOSES:  ?1.  Impingement syndrome, right shoulder. ?2.  Degenerative arthritis, acromioclavicular joint.   ?3.  Rotator cuff tear involving supraspinatus tendon with tear of biceps tendon. ? ?PROCEDURES:   ?1.  Diagnostic arthroscopy, right shoulder with release of biceps tendon and debridement of synovitis. ?2.  Arthroscopic subacromial decompression. ?3.  Arthroscopic distal clavicle resection. ?4.  Mini open rotator cuff tear repair with biceps tenodesis. ? ?SURGEON:  Vonna Kotyk. Durward Fortes, MD ? ?ASSISTANT:  Biagio Borg, PA-C, was present throughout the operative procedure to ensure its timely completion.  He was present from the beginning to the end of the procedure and was instrumental in retracting, wound suturing and application of the  ?dressing. ? ?ANESTHESIA:  General laryngeal with supplemental interscalene nerve block. ? ?COMPLICATIONS: None. ? ?HISTORY:  A 61 year old female noted onset of shoulder pain several months ago after shampooing a rug.  Despite exercises and subacromial decompression, she continued to have pain to the point of compromise and particularly with sleeping and raising her  ?arm overhead.  MRI scan demonstrated a type 2 acromion with both lateral and anterior acromial spurring associated with degenerative changes at the [facility] joint with marrow edema.  In addition, she had a tear predominantly of the supraspinatus with some  ?early atrophy.  Because of poor response to nonoperative treatment, she is now to have rotator cuff tear repair. ? ?DESCRIPTION OF  PROCEDURE:  The patient was met in the holding area, identified the right shoulder as the appropriate operative site and marked it accordingly.  Any questions were answered.  Anesthesia performed an interscalene nerve block with Exparel. ? ?The patient was then transferred to room #4.  She was carefully placed on the operating table and general laryngeal anesthesia was induced without problem.  The patient was then placed in a shoulder frame.  She was placed in a semi-sitting position  ?without difficulty. ? ?Timeout was called.  Exam of the right shoulder under anesthesia without evidence of instability or adhesive capsulitis. ? ?Right shoulder was then prepped with DuraPrep and the base of the neck circumferentially below the elbow.  Sterile draping was performed.  Two grams Ancef administered IV. ? ?Marking pen was used to outline the [facility] joint, the coracoid and acromion.  At a point fingerbreadth posterior and medial to the posterior angle of acromion, a small stab wound was made.  The arthroscope easily placed into the shoulder joint.  Arthroscopy  ?demonstrated some grade II changes of chondromalacia on the glenoid.  There were grade I changes on the humeral head.  No loose bodies.  Subscapularis was intact.  Labrum was intact.  There was an obvious tear of over 50% of the biceps tendon about a  ?centimeter from its attachment to the superior glenoid.  A second portal was established anteriorly with a ribbed cannula.  I released the majority of the biceps tendon and debrided any synovitis.  An obvious rotator cuff tear involving the supraspinatus ? was identified. ? ?The arthroscope was then placed in the subacromial space posteriorly, the cannula in the subacromial space anteriorly and a third portal was established in the lateral subacromial space.  Arthroscopic subacromial decompression was performed.  There was  ?inflammatory bursal tissue, which was resected with the Arthrocare wand.  There was obvious  overhang of the anterior and lateral acromion.  A 6 mm bur was used to perform a nice flat resection.  There were degenerative changes at the [facility] joint with  ?synovitis.  A distal clavicle resection was performed with a 6 mm bur.  I thought I had a very nice resection. ? ?The cuff tear was also visible from the bursal surface. ? ?The arthroscopic instruments were removed.  A second pair of sterile gloves was applied.  A mini open rotator cuff tear repair and biceps tenodesis was performed.  I made about an inch incision along the anterior aspect of the shoulder in line with the  ?biceps tendon.  Via sharp dissection, it was carried down to subcutaneous tissue.  Gross bleeders were Bovie coagulated.  Biceps tendon was easily identified.  I tenodesed it to its inferior groove with a 5.5 mm PEEK anchor and attached #2 FiberWire.   ?Once it was tenodesed, I then removed the remaining proximal biceps tendon without a problem. ? ?A retractor was then placed beneath the acromion.  The rotator cuff tear involved the supraspinatus.  There was some early atrophy along the edges.  The edges were rounded, so I debrided them sharply with a good bleeding tissue and removed some bony  ?prominence of the anterior acromion where the tear confines.  Then, starting at the apex as the tear was a V-shaped, I sutured the tendon with a running 0 Ethibond suture with a nice repair.  I performed a second row repair with a running 0 Vicryl.   ?Wound was then irrigated with saline solution.  By finger palpation, I thought I had a nice subacromial decompression.  The wound was irrigated again.  The deltoid fascia closed with a running 0 Vicryl, subcutaneous with a 3-0 Monocryl and Steri-Strips.  ? Sterile bulky dressing was applied by a sling. ? ?The patient tolerated the procedure without complications.   ? ?PLAN: Office next week.  Sling, oxycodone for pain. ? ? ? ? ? ?PAA ?D: 11/24/2021 9:17:23 am T: 11/24/2021 10:03:00 am  ?JOB: [id number]/  [id number]  ?

## 2021-11-25 ENCOUNTER — Encounter (HOSPITAL_COMMUNITY): Payer: Self-pay | Admitting: Orthopaedic Surgery

## 2021-11-26 ENCOUNTER — Encounter: Payer: BC Managed Care – PPO | Admitting: Orthopaedic Surgery

## 2021-11-27 ENCOUNTER — Other Ambulatory Visit (HOSPITAL_COMMUNITY): Payer: BC Managed Care – PPO

## 2021-12-03 ENCOUNTER — Encounter: Payer: Self-pay | Admitting: Orthopaedic Surgery

## 2021-12-03 ENCOUNTER — Ambulatory Visit (INDEPENDENT_AMBULATORY_CARE_PROVIDER_SITE_OTHER): Payer: BC Managed Care – PPO | Admitting: Orthopaedic Surgery

## 2021-12-03 DIAGNOSIS — S46011D Strain of muscle(s) and tendon(s) of the rotator cuff of right shoulder, subsequent encounter: Secondary | ICD-10-CM

## 2021-12-03 NOTE — Progress Notes (Signed)
? ?Office Visit Note ?  ?Patient: Tammy Aguirre           ?Date of Birth: 08/23/1960           ?MRN: [id number] ?Visit Date: 12/03/2021 ?             ?Requested by: Renford Dills, MD ?[address] ?[address] ?[place],  [place] [address] ?PCP: Renford Dills, MD ? ? ?Assessment & Plan: ?Visit Diagnoses: Right shoulder pain ? ?Plan: Patient is 9 days status post right shoulder arthroscopy with distal clavicle excision and subacromial decompression of biceps tenodesis and mini rotator open cuff repair.  She denies fever chills calf pain.  She has manage her pain with just Advil.  She was given some pendulum exercises and passive range of motion exercises she may do at home until she engages with physical therapy.  She understands not to do anything above her head.  She will be passive range of motion until 6 weeks postop ? ?Follow-Up Instructions: 2 weeks ? ?Orders:  ?No orders of the defined types were placed in this encounter. ? ?No orders of the defined types were placed in this encounter. ? ? ? ? Procedures: ?No procedures performed ? ? ?Clinical Data: ?No additional findings. ? ? ?Subjective: ?Chief Complaint  ?Patient presents with  ? Right Shoulder - Follow-up  ?  Right shoulder arthroscopy 11/24/2021  ?Patient presents today for follow up on her right shoulder. She had a right shoulder arthroscopy on 11/24/2021 She is now 9days out from surgery. She is taking Advil as needed for pain relief. ? ? ? ?Review of Systems  ?All other systems reviewed and are negative. ? ? ?Objective: ?Vital Signs: There were no vitals taken for this visit. ? ?Physical Exam ?Patient appears well appropriate and comfortable to exam ?Ortho Exam ?Right shoulder incisions of healed well she has well apposed wound edges no evidence of dehiscence no drainage except minimal amount of old bloody drainage from one of the portals.  No redness no signs of cellulitis or infection ?Specialty Comments:  ?No specialty comments  available. ? ?Imaging: ?No results found. ? ? ?PMFS History: ?Patient Active Problem List  ? Diagnosis Date Noted  ? Complete tear of right rotator cuff 10/29/2021  ? Pain in right shoulder 09/24/2021  ? Anterior knee pain, right 10/09/2020  ? Overweight with body mass index (BMI) 25.0-29.9 10/20/2017  ? Endometriosis 10/20/2017  ? Hypertensive disorder 10/20/2017  ? Cyst of ovary 08/02/1996  ? Abnormal cervical Papanicolaou smear 08/03/1983  ? ?Past Medical History:  ?Diagnosis Date  ? Anemia   ? History of  ? Arthritis   ? Right Shoulder  ? History of TIA (transient ischemic attack)   ? Hypertension   ? Migraines   ? History of  ? PONV (postoperative nausea and vomiting)   ?  ?No family history on file.  ?Past Surgical History:  ?Procedure Laterality Date  ? ABDOMINAL HYSTERECTOMY    ? CESAREAN SECTION    ? COLONOSCOPY    ? NECK SURGERY    ? plate in neck  ? SHOULDER ARTHROSCOPY WITH OPEN ROTATOR CUFF REPAIR AND DISTAL CLAVICLE ACROMINECTOMY Right 11/24/2021  ? Procedure: RIGHT SHOULDER ARTHROSCOPIC SUBACROMIAL DECOMPRESSION WITH OPEN ROTATOR CUFF REPAIR AND DISTAL CLAVICLE ACROMINECTOMY;  Surgeon: Valeria Batman, MD;  Location: [facility];  Service: Orthopedics;  Laterality: Right;  ? UPPER GI ENDOSCOPY    ? ?Social History  ? ?Occupational History  ? Not on file  ?Tobacco  Use  ? Smoking status: Never  ? Smokeless tobacco: Never  ?Vaping Use  ? Vaping Use: Never used  ?Substance and Sexual Activity  ? Alcohol use: No  ? Drug use: No  ? Sexual activity: Yes  ?  Birth control/protection: Surgical  ? ? ? ? ? ? ?

## 2021-12-03 NOTE — Telephone Encounter (Signed)
yes

## 2021-12-03 NOTE — Telephone Encounter (Signed)
Called and left message on machine to notify patient.  ?

## 2021-12-03 NOTE — Telephone Encounter (Signed)
Patient wants to know when is it okay for her to start back wear a bra? ?

## 2021-12-08 ENCOUNTER — Ambulatory Visit: Payer: BC Managed Care – PPO | Admitting: Rehabilitative and Restorative Service Providers"

## 2021-12-08 ENCOUNTER — Other Ambulatory Visit: Payer: Self-pay

## 2021-12-08 ENCOUNTER — Encounter: Payer: Self-pay | Admitting: Rehabilitative and Restorative Service Providers"

## 2021-12-08 DIAGNOSIS — R600 Localized edema: Secondary | ICD-10-CM | POA: Diagnosis not present

## 2021-12-08 DIAGNOSIS — G8929 Other chronic pain: Secondary | ICD-10-CM

## 2021-12-08 DIAGNOSIS — M6281 Muscle weakness (generalized): Secondary | ICD-10-CM

## 2021-12-08 DIAGNOSIS — R293 Abnormal posture: Secondary | ICD-10-CM | POA: Diagnosis not present

## 2021-12-08 DIAGNOSIS — M25511 Pain in right shoulder: Secondary | ICD-10-CM

## 2021-12-08 NOTE — Therapy (Signed)
?OUTPATIENT PHYSICAL THERAPY EVALUATION ? ? ?Patient Name: Tammy Aguirre ?MRN: [id number] ?DOB:[birth year], 61 y.o., female ?Today's Date: 12/08/2021 ? ? PT End of Session - 12/08/21 0839   ? ? Visit Number 1   ? Number of Visits 20   ? Date for PT Re-Evaluation 02/16/22   ? Authorization Type BCBS 30% coninsurance   ? PT Start Time 0845   ? PT Stop Time 0925   ? PT Time Calculation (min) 40 min   ? Activity Tolerance Patient limited by pain   ? Behavior During Therapy [facility] for tasks assessed/performed   ? ?  ?  ? ?  ? ? ?Past Medical History:  ?Diagnosis Date  ? Anemia   ? History of  ? Arthritis   ? Right Shoulder  ? History of TIA (transient ischemic attack)   ? Hypertension   ? Migraines   ? History of  ? PONV (postoperative nausea and vomiting)   ? ?Past Surgical History:  ?Procedure Laterality Date  ? ABDOMINAL HYSTERECTOMY    ? CESAREAN SECTION    ? COLONOSCOPY    ? NECK SURGERY    ? plate in neck  ? SHOULDER ARTHROSCOPY WITH OPEN ROTATOR CUFF REPAIR AND DISTAL CLAVICLE ACROMINECTOMY Right 11/24/2021  ? Procedure: RIGHT SHOULDER ARTHROSCOPIC SUBACROMIAL DECOMPRESSION WITH OPEN ROTATOR CUFF REPAIR AND DISTAL CLAVICLE ACROMINECTOMY;  Surgeon: Valeria Batman, MD;  Location: [facility];  Service: Orthopedics;  Laterality: Right;  ? UPPER GI ENDOSCOPY    ? ?Patient Active Problem List  ? Diagnosis Date Noted  ? Complete tear of right rotator cuff 10/29/2021  ? Pain in right shoulder 09/24/2021  ? Anterior knee pain, right 10/09/2020  ? Overweight with body mass index (BMI) 25.0-29.9 10/20/2017  ? Endometriosis 10/20/2017  ? Hypertensive disorder 10/20/2017  ? Cyst of ovary 08/02/1996  ? Abnormal cervical Papanicolaou smear 08/03/1983  ? ? ?PCP: Renford Dills, MD ? ?REFERRING PROVIDER: Valeria Batman, MD ? ?REFERRING DIAG: S46.011D (ICD-10-CM) - Traumatic complete tear of right rotator cuff, subsequent encounter ? ?THERAPY DIAG:  ?Chronic right shoulder pain ? ?Muscle weakness (generalized) ? ?Localized  edema ? ?Abnormal posture ? ? ?ONSET DATE: Surgery 11/24/2021 ? ?SUBJECTIVE:                                                                                                                                                                                     ? ?SUBJECTIVE STATEMENT: ?Pt indicated insidious worsening of Rt shoulder pain leading to surgery on 11/24/2021.  Pt indicated she was having pain and difficulty moving Rt shoulder.  Since surgery, having pain and difficulty c sleeping, wearing sling.  Has been  taking OTC medicine primarily due to not liking feelings with pain medicine. Pt indicated having a few movements from MD office for motion at home which she has been trying to do.  ? ?PERTINENT HISTORY: ?11/24/2021: SAD, open RTC repair and distal clavicle acrominectomy.  HTN, migraines, history of TIA, arthritis Rt shoulder ? ?PAIN:  ?NPRS scale: currently 4/10, at worst 7/10 ?Pain location: Rt shoulder ?Pain description: surgical, stinging, ache ?Aggravating factors: arm movement, nighttime ?Relieving factors: OTC medicine, sitting up instead of lying down at night ? ? ?PRECAUTIONS: Shoulder - PROM from 12/03/2021 for 6 weeks until January 14, 2022 ? ?WEIGHT BEARING RESTRICTIONS Yes shoulder ? ?FALLS:  ?Has patient fallen in last 6 months? No ? ?LIVING ENVIRONMENT: ?Lives with: lives with their family ?Lives in: House/apartment ? 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  ? ?PLOF: Independent, Rt hand dominant, walking for exercise, gardening, housework ? ?PATIENT GOALS Reduce pain, get back to doing normal activity/work ? ?OBJECTIVE:  ? ?PATIENT SURVEYS:  ?12/08/2021: FOTO eval: 43  predicted:   64 ? ?Patient Specific Functional Scale (0 unable to 10 no difficulty) ?1) Hair/self care: ? Eval 12/08/2021 : 0/10 ?2) Food prep ? Eval 12/08/2021 :  3/10 ?3) Computer work ? Eval 12/08/2021 : 6/10 ? ?COGNITION: ?12/08/2021: Overall cognitive status: Within functional limits for tasks  assessed ?    ?SENSATION: ?12/08/2021: WFL ? ?POSTURE: ?12/08/2021:  Wearing sling ? ?UPPER EXTREMITY ROM:  ? ?ROM Right ?PROM in supine ?12/08/2021 Left ?12/08/2021  ?Shoulder flexion 90, pain limited AROM in supine: ?170  ?Shoulder extension    ?Shoulder abduction 85, pain limited   ?Shoulder adduction    ?Shoulder internal rotation Hand to belly c arm at side   ?Shoulder external rotation 0 deg c arm at side, pain limited 70 in 45 deg abduction AROM  ?Elbow flexion    ?Elbow extension    ?Wrist flexion    ?Wrist extension    ?Wrist ulnar deviation    ?Wrist radial deviation    ?Wrist pronation    ?Wrist supination    ?(Blank rows = not tested) ? ?UPPER EXTREMITY MMT: ? ?MMT Right ?12/08/2021 ?Not tested due to surgery Left ?12/08/2021  ?Shoulder flexion    ?Shoulder extension    ?Shoulder abduction    ?Shoulder adduction    ?Shoulder internal rotation    ?Shoulder external rotation    ?Middle trapezius    ?Lower trapezius    ?Elbow flexion    ?Elbow extension    ?Wrist flexion    ?Wrist extension    ?Wrist ulnar deviation    ?Wrist radial deviation    ?Wrist pronation    ?Wrist supination    ?Grip strength (lbs)    ?(Blank rows = not tested) ? ?JOINT MOBILITY TESTING:  ?12/08/2021:Early to mid range Rt GH joint mobility testing WFL at this time, muscle guarding noted overall.  ? ?PALPATION:  ?12/08/2021: Tenderness to touch surrounding Rt shoulder joint in all sides ?  ?TODAY'S TREATMENT:  ?12/08/2021: ?  Therex:  ?   HEP instruction/performance c cues for techniques, handout provided.  Trial set performed of each for comprehension and symptom assessment.  See below for exercise list. ? ?  Manual ?   PROM Rt shoulder flexion, scaption, abduction with g2 inferior jt mobs performed in early to mid range ? ? ?PATIENT EDUCATION: ?12/08/2021: ?Education details: HEP, POC, protocol for PROM ?Person educated: Patient ?Education method: Explanation, Demonstration, Tactile cues, Verbal cues, and  Handouts ?Education comprehension: verbalized  understanding, returned demonstration, verbal cues required, and tactile cues required ? ? ?HOME EXERCISE PROGRAM: ?12/08/2021: ?Access Code: [email or web address] ?URL: [email or web address] ?Date: 12/08/2021 ?Prepared by: Chyrel Masson ? ?Exercises ?- Circular Shoulder Pendulum with Table Support  - 3-5 x daily - 7 x weekly - 1-2 sets - 10 reps ?- Standing Circular Shoulder Pendulum Supported with Arm Bent  - 3-5 x daily - 7 x weekly - 1-2 sets - 10 reps ?- Standing Flexion Extension Shoulder Pendulum Supported with Arm Bent  - 3-5 x daily - 7 x weekly - 1-2 sets - 10 reps ?- Seated Scapular Retraction  - 3-5 x daily - 7 x weekly - 1 sets - 10 reps - 3-5 hold ? ?ASSESSMENT: ? ?CLINICAL IMPRESSION: ?Patient is a 61 y.o. who comes to clinic with complaints of Rt shoulder pain s/p recent surgery on 11/24/2021 with mobility, strength and movement coordination deficits that impair their ability to perform usual daily and recreational functional activities without increase difficulty/symptoms at this time.  Patient to benefit from skilled PT services to address impairments and limitations to improve to previous level of function without restriction secondary to condition.  ? ? ?OBJECTIVE IMPAIRMENTS decreased activity tolerance, decreased coordination, decreased endurance, decreased mobility, decreased ROM, decreased strength, hypomobility, increased edema, increased fascial restrictions, impaired perceived functional ability, increased muscle spasms, impaired flexibility, impaired UE functional use, improper body mechanics, postural dysfunction, and pain.  ? ?ACTIVITY LIMITATIONS cleaning, community activity, driving, meal prep, occupation, laundry, and yard work.  ? ?PERSONAL FACTORS  HTN, migraines, history of TIA, arthritis Rt shoulder  are also affecting patient's functional outcome.  ? ? ?REHAB POTENTIAL: Good ? ?CLINICAL DECISION MAKING: Stable/uncomplicated ? ?EVALUATION COMPLEXITY: Low ? ? ?GOALS: ?Goals  reviewed with patient? Yes ?Eval : 12/08/2021 ? ?Short term PT Goals (target date for Short term goals are 3 weeks 12/29/2021) ?Patient will demonstrate independent use of home exercise program to maintain progress from in clinic treatment

## 2021-12-15 ENCOUNTER — Ambulatory Visit: Payer: BC Managed Care – PPO | Admitting: Rehabilitative and Restorative Service Providers"

## 2021-12-16 ENCOUNTER — Ambulatory Visit: Payer: BC Managed Care – PPO | Admitting: Rehabilitative and Restorative Service Providers"

## 2021-12-16 ENCOUNTER — Encounter: Payer: Self-pay | Admitting: Rehabilitative and Restorative Service Providers"

## 2021-12-16 DIAGNOSIS — M25511 Pain in right shoulder: Secondary | ICD-10-CM

## 2021-12-16 DIAGNOSIS — M6281 Muscle weakness (generalized): Secondary | ICD-10-CM

## 2021-12-16 DIAGNOSIS — R600 Localized edema: Secondary | ICD-10-CM

## 2021-12-16 DIAGNOSIS — R293 Abnormal posture: Secondary | ICD-10-CM | POA: Diagnosis not present

## 2021-12-16 DIAGNOSIS — G8929 Other chronic pain: Secondary | ICD-10-CM

## 2021-12-16 NOTE — Therapy (Signed)
?OUTPATIENT PHYSICAL THERAPY TREATMENT NOTE ? ? ?Patient Name: Tammy Aguirre ?MRN: [id number] ?DOB:[birth year], 61 y.o., female ?Today's Date: 12/16/2021 ? ?END OF SESSION:  ? PT End of Session - 12/16/21 0951   ? ? Visit Number 2   ? Number of Visits 20   ? Date for PT Re-Evaluation 02/16/22   ? Authorization Type BCBS 30% coninsurance   ? PT Start Time [phone]   ? PT Stop Time 1010   ? PT Time Calculation (min) 42 min   ? Activity Tolerance Patient limited by pain   ? Behavior During Therapy [facility] for tasks assessed/performed   ? ?  ?  ? ?  ? ? ?Past Medical History:  ?Diagnosis Date  ? Anemia   ? History of  ? Arthritis   ? Right Shoulder  ? History of TIA (transient ischemic attack)   ? Hypertension   ? Migraines   ? History of  ? PONV (postoperative nausea and vomiting)   ? ?Past Surgical History:  ?Procedure Laterality Date  ? ABDOMINAL HYSTERECTOMY    ? CESAREAN SECTION    ? COLONOSCOPY    ? NECK SURGERY    ? plate in neck  ? SHOULDER ARTHROSCOPY WITH OPEN ROTATOR CUFF REPAIR AND DISTAL CLAVICLE ACROMINECTOMY Right 11/24/2021  ? Procedure: RIGHT SHOULDER ARTHROSCOPIC SUBACROMIAL DECOMPRESSION WITH OPEN ROTATOR CUFF REPAIR AND DISTAL CLAVICLE ACROMINECTOMY;  Surgeon: Valeria Batman, MD;  Location: [facility];  Service: Orthopedics;  Laterality: Right;  ? UPPER GI ENDOSCOPY    ? ?Patient Active Problem List  ? Diagnosis Date Noted  ? Complete tear of right rotator cuff 10/29/2021  ? Pain in right shoulder 09/24/2021  ? Anterior knee pain, right 10/09/2020  ? Overweight with body mass index (BMI) 25.0-29.9 10/20/2017  ? Endometriosis 10/20/2017  ? Hypertensive disorder 10/20/2017  ? Cyst of ovary 08/02/1996  ? Abnormal cervical Papanicolaou smear 08/03/1983  ? ? ?PCP: Renford Dills, MD ?  ?REFERRING PROVIDER: Valeria Batman, MD ?  ?REFERRING DIAG: S46.011D (ICD-10-CM) - Traumatic complete tear of right rotator cuff, subsequent encounter ? ?ONSET DATE: Surgery 11/24/2021 ? ?THERAPY DIAG:  ?Chronic right shoulder  pain ? ?Muscle weakness (generalized) ? ?Localized edema ? ?Abnormal posture ? ?PERTINENT HISTORY: 11/24/2021 surgery: SAD, open RTC repair and distal clavicle acrominectomy.  HTN, migraines, history of TIA, arthritis Rt shoulder ? ?PRECAUTIONS: Shoulder - PROM from 12/03/2021 for 6 weeks until January 14, 2022 ? ?SUBJECTIVE: Pt indicated a little bit of sting this morning in shoulder. Pt indicated doing the HEP with some complaints at time.  ? ?PAIN:  ?NPRS scale: at worst since last visit 4/10 ?Pain location: Rt shoulder ?Pain description: surgical, stinging, ache ?Aggravating factors: arm movement, nighttime ?Relieving factors: OTC medicine, sitting up instead of lying down at night ? ? ?OBJECTIVE:  ?  ?PATIENT SURVEYS:  ?12/08/2021: FOTO eval: 43  predicted:   64 ?  ?Patient Specific Functional Scale (0 unable to 10 no difficulty) ?1) Hair/self care: ?            Eval 12/08/2021 : 0/10 ?2) Food prep ?            Eval 12/08/2021 :  3/10 ?3) Computer work ?            Eval 12/08/2021 : 6/10 ?  ?COGNITION: ?12/08/2021: Overall cognitive status: Within functional limits for tasks assessed ?                                    ?  SENSATION: ?12/08/2021: WFL ?  ?POSTURE: ?12/08/2021:  Wearing sling ?  ?UPPER EXTREMITY ROM:  ?  ?ROM Right ?PROM in supine ?12/08/2021 Left ?12/08/2021 Right ?PROM in supine ?12/16/2021  ?Shoulder flexion 90, pain limited AROM in supine: ?170 124, pain limited  ?Shoulder extension       ?Shoulder abduction 85, pain limited     ?Shoulder adduction       ?Shoulder internal rotation Hand to belly c arm at side     ?Shoulder external rotation 0 deg c arm at side, pain limited 70 in 45 deg abduction AROM 20 deg c arm in 20 deg abduction, pain limited  ?Elbow flexion       ?Elbow extension       ?Wrist flexion       ?Wrist extension       ?Wrist ulnar deviation       ?Wrist radial deviation       ?Wrist pronation       ?Wrist supination       ?(Blank rows = not tested) ?  ?UPPER EXTREMITY MMT: ?  ?MMT Right ?12/08/2021 ?Not  tested due to surgery Left ?12/08/2021  ?Shoulder flexion      ?Shoulder extension      ?Shoulder abduction      ?Shoulder adduction      ?Shoulder internal rotation      ?Shoulder external rotation      ?Middle trapezius      ?Lower trapezius      ?Elbow flexion      ?Elbow extension      ?Wrist flexion      ?Wrist extension      ?Wrist ulnar deviation      ?Wrist radial deviation      ?Wrist pronation      ?Wrist supination      ?Grip strength (lbs)      ?(Blank rows = not tested) ?  ?JOINT MOBILITY TESTING:  ?12/08/2021:Early to mid range Rt GH joint mobility testing WFL at this time, muscle guarding noted overall.  ?  ?PALPATION:  ?12/08/2021: Tenderness to touch surrounding Rt shoulder joint in all sides ?             ?TODAY'S TREATMENT:  ?12/16/2021: ?            Therex:             ?                        Supine wand ER to tolerance Rt shoulder 10 sec hold x 10 ?  Seated table slides flexion 5 sec hold x 10 ?  Verbal review of HEP techniques c handout provided c additional exercises listed above.  ?   ?  ?            Manual ?                        PROM Rt shoulder flexion, scaption, abduction with g2 inferior jt mobs performed in early to mid range ? ? Vasopneumatic ?  Rt shoulder 34 deg low compression 10 mins ? ?12/08/2021: ?            Therex:             ?                        HEP instruction/performance  c cues for techniques, handout provided.  Trial set performed of each for comprehension and symptom assessment.  See below for exercise list. ?  ?            Manual ?                        PROM Rt shoulder flexion, scaption, abduction with g2 inferior jt mobs performed in early to mid range ?  ?  ?PATIENT EDUCATION: ?12/16/2021: ?Education details: HEP, POC, protocol for PROM ?Person educated: Patient ?Education method: Explanation, Demonstration, Tactile cues, Verbal cues, and Handouts ?Education comprehension: verbalized understanding, returned demonstration, verbal cues required, and tactile cues required ?   ?  ?HOME EXERCISE PROGRAM: ?12/16/2021: ?Access Code: [email or web address] ?URL: [email or web address] ?Date: 12/16/2021 ?Prepared by: Chyrel MassonMichael Katonya Blecher ? ?Exercises ?- Circular Shoulder Pendulum with Table Support  - 3-5 x daily - 7 x weekly - 1-2 sets - 10 reps ?- Standing Circular Shoulder Pendulum Supported with Arm Bent  - 3-5 x daily - 7 x weekly - 1-2 sets - 10 reps ?- Standing Flexion Extension Shoulder Pendulum Supported with Arm Bent  - 3-5 x daily - 7 x weekly - 1-2 sets - 10 reps ?- Seated Scapular Retraction  - 3-5 x daily - 7 x weekly - 1 sets - 10 reps - 3-5 hold ?- Supine Shoulder External Rotation in 45 Degrees Abduction AAROM with Dowel (Mirrored)  - 2-3 x daily - 7 x weekly - 1-2 sets - 10 reps - 5 hold ?- Seated Shoulder Flexion Towel Slide at Table Top  - 2 x daily - 7 x weekly - 2-3 sets - 10 reps - 5 hold ?  ?ASSESSMENT: ?  ?CLINICAL IMPRESSION: ?General muscle guarding in all passive movement end ranges noted at this time.  Some progression in tolerance to activity with improvement in flexion mobility as noted in measurement today.  Continued skilled PT services indicated c focus on passive range improvements in accordance c MD referral.  ?  ?  ?OBJECTIVE IMPAIRMENTS decreased activity tolerance, decreased coordination, decreased endurance, decreased mobility, decreased ROM, decreased strength, hypomobility, increased edema, increased fascial restrictions, impaired perceived functional ability, increased muscle spasms, impaired flexibility, impaired UE functional use, improper body mechanics, postural dysfunction, and pain.  ?  ?ACTIVITY LIMITATIONS cleaning, community activity, driving, meal prep, occupation, laundry, and yard work.  ?  ?PERSONAL FACTORS  HTN, migraines, history of TIA, arthritis Rt shoulder  are also affecting patient's functional outcome.  ?  ?  ?REHAB POTENTIAL: Good ?  ?CLINICAL DECISION MAKING: Stable/uncomplicated ?  ?EVALUATION COMPLEXITY: Low ?  ?  ?GOALS: ?Goals  reviewed with patient? Yes ?Eval : 12/08/2021 ?  ?Short term PT Goals (target date for Short term goals are 3 weeks 12/29/2021) ?Patient will demonstrate independent use of home exercise program to maintain p

## 2021-12-22 ENCOUNTER — Ambulatory Visit (INDEPENDENT_AMBULATORY_CARE_PROVIDER_SITE_OTHER): Payer: BC Managed Care – PPO | Admitting: Orthopaedic Surgery

## 2021-12-22 ENCOUNTER — Ambulatory Visit: Payer: BC Managed Care – PPO | Admitting: Physical Therapy

## 2021-12-22 ENCOUNTER — Encounter: Payer: Self-pay | Admitting: Orthopaedic Surgery

## 2021-12-22 ENCOUNTER — Encounter: Payer: Self-pay | Admitting: Physical Therapy

## 2021-12-22 ENCOUNTER — Telehealth: Payer: Self-pay | Admitting: Orthopaedic Surgery

## 2021-12-22 DIAGNOSIS — M6281 Muscle weakness (generalized): Secondary | ICD-10-CM

## 2021-12-22 DIAGNOSIS — M25511 Pain in right shoulder: Secondary | ICD-10-CM

## 2021-12-22 DIAGNOSIS — R293 Abnormal posture: Secondary | ICD-10-CM | POA: Diagnosis not present

## 2021-12-22 DIAGNOSIS — R600 Localized edema: Secondary | ICD-10-CM | POA: Diagnosis not present

## 2021-12-22 DIAGNOSIS — G8929 Other chronic pain: Secondary | ICD-10-CM

## 2021-12-22 NOTE — Telephone Encounter (Signed)
Patient states she needs doctor to update [employer] on information about when she can return to work, she has already paid the $25 for [employer] and paperwork, but return to work date was missing he informed her on the date but it has to be officially reported to [employer] by the office. She has talked to him about it already it just hasnt been done yet. Fax number is [phone]

## 2021-12-22 NOTE — Progress Notes (Signed)
Office Visit Note   Patient: Tammy Aguirre           Date of Birth: 04/29/1961           MRN: [id number] Visit Date: 12/22/2021              Requested by: Seward Carol, MD [address] [place],  [place] [address] PCP: Seward Carol, MD   Assessment & Plan: Visit Diagnoses: Right shoulder pain  Plan: Patient is now 1 month status post right shoulder arthroscopy with distal clavicle excision and subacromial decompression and mini rotator cuff repair.  She has been working with physical therapy on passive range of motion.  She is hoping to go back to work when she is able to actively fire her arm  Follow-Up Instructions: 3 weeks  Orders:  No orders of the defined types were placed in this encounter.  No orders of the defined types were placed in this encounter.     Procedures: No procedures performed   Clinical Data: No additional findings.   Subjective: No chief complaint on file. Patient presents today for a three week follow up on her right shoulder. She had a right shoulder arthroscopy on 11/24/2021. She has been going to physical therapy twice weekly. She is wearing her sling. She is taking Advil as needed.     Review of Systems  All other systems reviewed and are negative.   Objective: Vital Signs: There were no vitals taken for this visit.  Physical Exam Patient appears well she is sitting in chair comfortable Ortho Exam Well-healed surgical incision no erythema no redness.  She has passive elevation to 130 degrees.  She has abduction to almost 90 degrees.  Slightly tight in forward elevation.  Distal sensation and pulses intact Specialty Comments:  No specialty comments available.  Imaging: No results found.   PMFS History: Patient Active Problem List   Diagnosis Date Noted   Complete tear of right rotator cuff 10/29/2021   Pain in right shoulder 09/24/2021   Anterior knee pain, right 10/09/2020   Overweight with body mass index  (BMI) 25.0-29.9 10/20/2017   Endometriosis 10/20/2017   Hypertensive disorder 10/20/2017   Cyst of ovary 08/02/1996   Abnormal cervical Papanicolaou smear 08/03/1983   Past Medical History:  Diagnosis Date   Anemia    History of   Arthritis    Right Shoulder   History of TIA (transient ischemic attack)    Hypertension    Migraines    History of   PONV (postoperative nausea and vomiting)     No family history on file.  Past Surgical History:  Procedure Laterality Date   ABDOMINAL HYSTERECTOMY     CESAREAN SECTION     COLONOSCOPY     NECK SURGERY     plate in neck   SHOULDER ARTHROSCOPY WITH OPEN ROTATOR CUFF REPAIR AND DISTAL CLAVICLE ACROMINECTOMY Right 11/24/2021   Procedure: RIGHT SHOULDER ARTHROSCOPIC SUBACROMIAL DECOMPRESSION WITH OPEN ROTATOR CUFF REPAIR AND DISTAL CLAVICLE ACROMINECTOMY;  Surgeon: [name], MD;  Location: [facility];  Service: Orthopedics;  Laterality: Right;   UPPER GI ENDOSCOPY     Social History   Occupational History   Not on file  Tobacco Use   Smoking status: Never   Smokeless tobacco: Never  Vaping Use   Vaping Use: Never used  Substance and Sexual Activity   Alcohol use: No   Drug use: No   Sexual activity: Yes    Birth  control/protection: Surgical
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
## 2021-12-22 NOTE — Telephone Encounter (Signed)
Work note to reflect possible return to work about mid June

## 2021-12-23 NOTE — Telephone Encounter (Signed)
Work note faxed [phone]

## 2021-12-24 ENCOUNTER — Ambulatory Visit: Payer: BC Managed Care – PPO | Admitting: Rehabilitative and Restorative Service Providers"

## 2021-12-24 ENCOUNTER — Encounter: Payer: Self-pay | Admitting: Rehabilitative and Restorative Service Providers"

## 2021-12-24 DIAGNOSIS — R600 Localized edema: Secondary | ICD-10-CM | POA: Diagnosis not present

## 2021-12-24 DIAGNOSIS — M6281 Muscle weakness (generalized): Secondary | ICD-10-CM | POA: Diagnosis not present

## 2021-12-24 DIAGNOSIS — G8929 Other chronic pain: Secondary | ICD-10-CM

## 2021-12-24 DIAGNOSIS — R293 Abnormal posture: Secondary | ICD-10-CM

## 2021-12-24 DIAGNOSIS — M25511 Pain in right shoulder: Secondary | ICD-10-CM | POA: Diagnosis not present

## 2021-12-24 NOTE — Therapy (Addendum)
OUTPATIENT PHYSICAL THERAPY TREATMENT NOTE   Patient Name: Tammy Aguirre MRN: [id number] DOB:07-May-1961, 61 y.o., female Today's Date: 12/24/2021  END OF SESSION:   PT End of Session - 12/24/21 0842     Visit Number 4    Number of Visits 20    Date for PT Re-Evaluation 02/16/22    Authorization Type [employer] 30% coninsurance    PT Start Time 0843    PT Stop Time 0933    PT Time Calculation (min) 50 min    Activity Tolerance Patient limited by pain    Behavior During Therapy WFL for tasks assessed/performed               Past Medical History:  Diagnosis Date   Anemia    History of   Arthritis    Right Shoulder   History of TIA (transient ischemic attack)    Hypertension    Migraines    History of   PONV (postoperative nausea and vomiting)    Past Surgical History:  Procedure Laterality Date   ABDOMINAL HYSTERECTOMY     CESAREAN SECTION     COLONOSCOPY     NECK SURGERY     plate in neck   SHOULDER ARTHROSCOPY WITH OPEN ROTATOR CUFF REPAIR AND DISTAL CLAVICLE ACROMINECTOMY Right 11/24/2021   Procedure: RIGHT SHOULDER ARTHROSCOPIC SUBACROMIAL DECOMPRESSION WITH OPEN ROTATOR CUFF REPAIR AND DISTAL CLAVICLE ACROMINECTOMY;  Surgeon: [name], MD;  Location: [facility];  Service: Orthopedics;  Laterality: Right;   UPPER GI ENDOSCOPY     Patient Active Problem List   Diagnosis Date Noted   Complete tear of right rotator cuff 10/29/2021   Pain in right shoulder 09/24/2021   Anterior knee pain, right 10/09/2020   Overweight with body mass index (BMI) 25.0-29.9 10/20/2017   Endometriosis 10/20/2017   Hypertensive disorder 10/20/2017   Cyst of ovary 08/02/1996   Abnormal cervical Papanicolaou smear 08/03/1983    PCP: Seward Carol, MD   REFERRING PROVIDER: [name], MD   REFERRING DIAG: S46.011D (ICD-10-CM) - Traumatic complete tear of right rotator cuff, subsequent encounter  ONSET DATE: Surgery 11/24/2021  THERAPY DIAG:  Chronic right  shoulder pain  Muscle weakness (generalized)  Localized edema  Abnormal posture  PERTINENT HISTORY: 11/24/2021 surgery: SAD, open RTC repair and distal clavicle acrominectomy.  HTN, migraines, history of TIA, arthritis Rt shoulder  PRECAUTIONS: Shoulder - AAROM from 12/22/2021 to 01/05/2022  SUBJECTIVE: Pt indicated complaints about 3/10 today upon arrival.  PAIN:  NPRS scale: at worst since last visit 3/10 Pain location: Rt shoulder Pain description: surgical, stinging, ache Aggravating factors: arm movement, nighttime Relieving factors: OTC medicine, sitting up instead of lying down at night   OBJECTIVE:    PATIENT SURVEYS:  12/08/2021: FOTO eval: 43  predicted:   64   Patient Specific Functional Scale (0 unable to 10 no difficulty) 1) Hair/self care:             Eval 12/08/2021 : 0/10 2) Food prep             Eval 12/08/2021 :  3/10 3) Computer work             Eval 12/08/2021 : 6/10   COGNITION: 12/08/2021: Overall cognitive status: Within functional limits for tasks assessed                                     SENSATION:  12/08/2021: WFL   POSTURE: 12/08/2021:  Wearing sling   UPPER EXTREMITY ROM:    ROM Right PROM in supine 12/08/2021 Left 12/08/2021 Right PROM in supine 12/16/2021 Rt PROM  Supine 12/22/2021  Shoulder flexion 90, pain limited AROM in supine: 170 124, pain limited 130 deg c pain at end range  Shoulder extension        Shoulder abduction 85, pain limited    90 deg c pain  Shoulder adduction        Shoulder internal rotation Hand to belly c arm at side      Shoulder external rotation 0 deg c arm at side, pain limited 70 in 45 deg abduction AROM 20 deg c arm in 20 deg abduction, pain limited 18 deg arm in 20 deg abd c pain   Elbow flexion        Elbow extension        Wrist flexion        Wrist extension        Wrist ulnar deviation        Wrist radial deviation        Wrist pronation        Wrist supination        (Blank rows = not tested)   UPPER  EXTREMITY MMT:   MMT Right 12/08/2021 Not tested due to surgery Left 12/08/2021  Shoulder flexion      Shoulder extension      Shoulder abduction      Shoulder adduction      Shoulder internal rotation      Shoulder external rotation      Middle trapezius      Lower trapezius      Elbow flexion      Elbow extension      Wrist flexion      Wrist extension      Wrist ulnar deviation      Wrist radial deviation      Wrist pronation      Wrist supination      Grip strength (lbs)      (Blank rows = not tested)   JOINT MOBILITY TESTING:  12/08/2021:Early to mid range Rt GH joint mobility testing WFL at this time, muscle guarding noted overall.    PALPATION:  12/08/2021: Tenderness to touch surrounding Rt shoulder joint in all sides                TODAY'S TREATMENT:  12/24/2021:             Therex:                                     Supine wand ER to tolerance Rt shoulder in 45 deg abduction 10 sec stretch, 5 sec contract/relax technique x 5   Supine AAROM 1 lb bar flexion x 10 c 2-3 sec hold in end range   Supine scapular retraction hold into table 5 sec x 10   Pulley flexion, scaption 2 mins, movement to tolerance.                  Manual                         PROM Rt shoulder flexion, scaption, abduction with g2 inferior jt mobs performed in early to mid range.  Contract relax techniques for ER and subscapularis  relaxation for ER mobility gains.    Vasopneumatic   Rt shoulder 34 deg low compression 10 mins  12/22/2021: Therex:             Supine wand ER to tolerance Rt shoulder 10 sec hold x 10 Seated table slides flexion 5 sec hold x 10 Seated table slides scaption 5 sec hold x 10 Seated table slides ER 5 sec hold x 10 Manual:  PROM Rt shoulder flexion, scaption, abduction with Grade 2 inferior jt mobs  Modalities:  Vasopneumatic : Rt shoulder 34 deg low compression 10 mins   12/16/2021:             Therex:                                     Supine wand ER to  tolerance Rt shoulder 10 sec hold x 10   Seated table slides flexion 5 sec hold x 10   Verbal review of HEP techniques c handout provided c additional exercises listed above.                   Manual                         PROM Rt shoulder flexion, scaption, abduction with g2 inferior jt mobs performed in early to mid range   Vasopneumatic   Rt shoulder 34 deg low compression 10 mins  12/08/2021:             Therex:                                     HEP instruction/performance c cues for techniques, handout provided.  Trial set performed of each for comprehension and symptom assessment.  See below for exercise list.               Manual                         PROM Rt shoulder flexion, scaption, abduction with g2 inferior jt mobs performed in early to mid range     PATIENT EDUCATION: 12/24/2021: Education details: HEP progression Person educated: Patient Education method: Consulting civil engineer, Demonstration, Corporate treasurer cues, Verbal cues, and Handouts Education comprehension: verbalized understanding, returned demonstration, verbal cues required, and tactile cues required     HOME EXERCISE PROGRAM: Access Code: [email or web address] URL: [email or web address] Date: 12/24/2021 Prepared by: Scot Jun  Exercises - Circular Shoulder Pendulum with Table Support  - 3-5 x daily - 7 x weekly - 1-2 sets - 10 reps - Seated Scapular Retraction  - 3-5 x daily - 7 x weekly - 1 sets - 10 reps - 3-5 hold - Supine Shoulder External Rotation in 45 Degrees Abduction AAROM with Dowel (Mirrored)  - 2-3 x daily - 7 x weekly - 1-2 sets - 10 reps - 5 hold - Seated Shoulder Flexion Towel Slide at Table Top  - 2-3 x daily - 7 x weekly - 1-2 sets - 10 reps - 5 hold - Supine Shoulder Flexion Extension AAROM with Dowel  - 2-3 x daily - 7 x weekly - 2-3 sets - 10 reps - 2-3 hold - Seated Shoulder Flexion AAROM with Pulley Behind (Mirrored)  - 2-3 x daily - 7 x weekly -  1 sets - 3 mins hold - Seated Shoulder  Scaption AAROM with Pulley at Side (Mirrored)  - 2-3 x daily - 7 x weekly - 1 sets - 3 mins hold   ASSESSMENT:   CLINICAL IMPRESSION: MD referral note indicated transitioning to [facility] allowed for next 2 weeks.  Pt continued to show guarding and increased myofascial restriction in ER as most prominent mobility restriction at this time.  Early AAROM intervention performed today c fair control/tolerance.  Cues given for progression of HEP to include AAROM flexion in supine as well as contract/relax techniques for ER mobility c wand.  Continued skilled PT services indicated at this time.      OBJECTIVE IMPAIRMENTS decreased activity tolerance, decreased coordination, decreased endurance, decreased mobility, decreased ROM, decreased strength, hypomobility, increased edema, increased fascial restrictions, impaired perceived functional ability, increased muscle spasms, impaired flexibility, impaired UE functional use, improper body mechanics, postural dysfunction, and pain.    ACTIVITY LIMITATIONS cleaning, community activity, driving, meal prep, occupation, laundry, and yard work.    PERSONAL FACTORS  HTN, migraines, history of TIA, arthritis Rt shoulder  are also affecting patient's functional outcome.      REHAB POTENTIAL: Good   CLINICAL DECISION MAKING: Stable/uncomplicated   EVALUATION COMPLEXITY: Low     GOALS: Goals reviewed with patient? Yes Eval : 12/08/2021   Short term PT Goals (target date for Short term goals are 3 weeks 12/29/2021) Patient will demonstrate independent use of home exercise program to maintain progress from in clinic treatments. Goal status: MET 12/22/2021   Long term PT goals (target dates for all long term goals are 10 weeks  02/16/2022 )   1. Patient will demonstrate/report pain at worst less than or equal to 2/10 to facilitate minimal limitation in daily activity secondary to pain symptoms. Goal status: on going - assessed 12/24/2021   2. Patient will  demonstrate independent use of home exercise program to facilitate ability to maintain/progress functional gains from skilled physical therapy services. Goal status:  on going - assessed 12/24/2021   3. Patient will demonstrate FOTO outcome > or = 64 % to indicate reduced disability due to condition. Goal status:  on going - assessed 12/24/2021   4.  Patient will demonstrate Rt [place] joint mobility WFL to facilitate usual self care, dressing, reaching overhead at PLOF s limitation due to symptoms.  Goal status:  on going - assessed 12/24/2021   5.  Patient will demonstrate Rt UE MMT > or = 4/5 throughout to facilitate usual lifting, carrying in functional activity to PLOF s limitation.   Goal status:  on going - assessed 12/24/2021   6.  Patient will demonstrate/report patient specific functional scale reporting on average 8/10 or greater for return to PLOF.   Goal status:  on going - assessed 12/24/2021   7.  Patient will demonstrate/report return to work, sleep s restriction due to symptoms.  a.  Goal Status:  on going - assessed 12/24/2021     PLAN: PT FREQUENCY: 2x/week   PT DURATION: 10 weeks   PLANNED INTERVENTIONS:  Therapeutic exercises, Therapeutic activity, Neuro Muscular re-education, Balance training, Gait training, Patient/Family education, Joint mobilization, Stair training, DME instructions, Dry Needling, Electrical stimulation, Cryotherapy, Moist heat, Taping, Ultrasound, Ionotophoresis 83m/ml Dexamethasone, and Manual therapy.  All included unless contraindicated   PLAN FOR NEXT SESSION: End range mobility gains, AAROM transitioning as tolerated.    PROM/AAROM for until 01/05/2022 per MD note   MScot Jun PT, DPT, OCS, ATC 12/24/21  9:31 AM

## 2021-12-30 ENCOUNTER — Ambulatory Visit: Payer: BC Managed Care – PPO | Admitting: Rehabilitative and Restorative Service Providers"

## 2021-12-30 ENCOUNTER — Encounter: Payer: Self-pay | Admitting: Rehabilitative and Restorative Service Providers"

## 2021-12-30 DIAGNOSIS — R293 Abnormal posture: Secondary | ICD-10-CM

## 2021-12-30 DIAGNOSIS — M25511 Pain in right shoulder: Secondary | ICD-10-CM | POA: Diagnosis not present

## 2021-12-30 DIAGNOSIS — R600 Localized edema: Secondary | ICD-10-CM | POA: Diagnosis not present

## 2021-12-30 DIAGNOSIS — M6281 Muscle weakness (generalized): Secondary | ICD-10-CM | POA: Diagnosis not present

## 2021-12-30 DIAGNOSIS — G8929 Other chronic pain: Secondary | ICD-10-CM

## 2021-12-30 NOTE — Therapy (Signed)
OUTPATIENT PHYSICAL THERAPY TREATMENT NOTE   Patient Name: Tammy Aguirre MRN: [id number] DOB:24-Jul-1961, 61 y.o., female Today's Date: 12/30/2021  END OF SESSION:   PT End of Session - 12/30/21 0935     Visit Number 5    Number of Visits 20    Date for PT Re-Evaluation 02/16/22    Authorization Type BC[employer] 30% coninsurance    PT Start Time 0930    PT Stop Time 1020    PT Time Calculation (min) 50 min    Activity Tolerance Patient limited by pain    Behavior During Therapy WFL for tasks assessed/performed                Past Medical History:  Diagnosis Date   Anemia    History of   Arthritis    Right Shoulder   History of TIA (transient ischemic attack)    Hypertension    Migraines    History of   PONV (postoperative nausea and vomiting)    Past Surgical History:  Procedure Laterality Date   ABDOMINAL HYSTERECTOMY     CESAREAN SECTION     COLONOSCOPY     NECK SURGERY     plate in neck   SHOULDER ARTHROSCOPY WITH OPEN ROTATOR CUFF REPAIR AND DISTAL CLAVICLE ACROMINECTOMY Right 11/24/2021   Procedure: RIGHT SHOULDER ARTHROSCOPIC SUBACROMIAL DECOMPRESSION WITH OPEN ROTATOR CUFF REPAIR AND DISTAL CLAVICLE ACROMINECTOMY;  Surgeon: [name], MD;  Location: [facility];  Service: Orthopedics;  Laterality: Right;   UPPER GI ENDOSCOPY     Patient Active Problem List   Diagnosis Date Noted   Complete tear of right rotator cuff 10/29/2021   Pain in right shoulder 09/24/2021   Anterior knee pain, right 10/09/2020   Overweight with body mass index (BMI) 25.0-29.9 10/20/2017   Endometriosis 10/20/2017   Hypertensive disorder 10/20/2017   Cyst of ovary 08/02/1996   Abnormal cervical Papanicolaou smear 08/03/1983    PCP: Seward Carol, MD   REFERRING PROVIDER: [name], MD   REFERRING DIAG: S46.011D (ICD-10-CM) - Traumatic complete tear of right rotator cuff, subsequent encounter  ONSET DATE: Surgery 11/24/2021  THERAPY DIAG:  Chronic right  shoulder pain  Muscle weakness (generalized)  Localized edema  Abnormal posture  PERTINENT HISTORY: 11/24/2021 surgery: SAD, open RTC repair and distal clavicle acrominectomy.  HTN, migraines, history of TIA, arthritis Rt shoulder  PRECAUTIONS: Shoulder - AAROM from 12/22/2021 to 01/05/2022  SUBJECTIVE: Pt indicated she got pulley for home use and it has been hard to do.  Noticing pain with movement and also at rest.    PAIN:  NPRS scale: pain upon arrival: 4/10 Pain location: Rt shoulder Pain description: surgical, stinging, ache Aggravating factors: arm movement, nighttime Relieving factors: OTC medicine, sitting up instead of lying down at night   OBJECTIVE:    PATIENT SURVEYS:  12/08/2021: FOTO eval: 43  predicted:   64   Patient Specific Functional Scale (0 unable to 10 no difficulty) 1) Hair/self care:             Eval 12/08/2021 : 0/10 2) Food prep             Eval 12/08/2021 :  3/10 3) Computer work             Eval 12/08/2021 : 6/10   COGNITION: 12/08/2021: Overall cognitive status: Within functional limits for tasks assessed  SENSATION: 12/08/2021: WFL   POSTURE: 12/08/2021:  Wearing sling   UPPER EXTREMITY ROM:    ROM Right PROM in supine 12/08/2021 Left 12/08/2021 Right PROM in supine 12/16/2021 Rt PROM  Supine 12/22/2021 Rt PROM Supine 12/30/2021  Shoulder flexion 90, pain limited AROM in supine: 170 124, pain limited 130 deg c pain at end range 140 deg c pain end range  Shoulder extension         Shoulder abduction 85, pain limited    90 deg c pain 105 deg c pain end range  Shoulder adduction         Shoulder internal rotation Hand to belly c arm at side       Shoulder external rotation 0 deg c arm at side, pain limited 70 in 45 deg abduction AROM 20 deg c arm in 20 deg abduction, pain limited 18 deg arm in 20 deg abd c pain  40 deg in 45 deg abduction c end range pain/tightness  Elbow flexion         Elbow extension          Wrist flexion         Wrist extension         Wrist ulnar deviation         Wrist radial deviation         Wrist pronation         Wrist supination         (Blank rows = not tested)   UPPER EXTREMITY MMT:   MMT Right 12/08/2021 Not tested due to surgery Left 12/08/2021  Shoulder flexion      Shoulder extension      Shoulder abduction      Shoulder adduction      Shoulder internal rotation      Shoulder external rotation      Middle trapezius      Lower trapezius      Elbow flexion      Elbow extension      Wrist flexion      Wrist extension      Wrist ulnar deviation      Wrist radial deviation      Wrist pronation      Wrist supination      Grip strength (lbs)      (Blank rows = not tested)   JOINT MOBILITY TESTING:  12/08/2021:Early to mid range Rt GH joint mobility testing WFL at this time, muscle guarding noted overall.    PALPATION:  12/08/2021: Tenderness to touch surrounding Rt shoulder joint in all sides               TODAY'S TREATMENT:  12/30/2021:             Therex:                                     Supine wand ER to tolerance Rt shoulder in 45 deg abduction 15 se x 5    Supine AAROM 1 lb bar flexion 2 x 15 c 2-3 sec hold in end range   Supine scapular retraction hold into table 5 sec x 10   UBE AAROM 3 mins fwd/back each way lvl 1.0                 Manual  PROM Rt shoulder flexion, scaption, abduction with g2 inferior jt mobs performed in early to mid range.  Contract relax techniques for ER and subscapularis relaxation for ER mobility gains.    Vasopneumatic   Rt shoulder 34 deg low compression 10 mins  12/24/2021:             Therex:                                     Supine wand ER to tolerance Rt shoulder in 45 deg abduction 10 sec stretch, 5 sec contract/relax technique x 5   Supine AAROM 1 lb bar flexion x 10 c 2-3 sec hold in end range   Supine scapular retraction hold into table 5 sec x 10   Pulley flexion, scaption 2  mins, movement to tolerance.                  Manual                         PROM Rt shoulder flexion, scaption, abduction with g2 inferior jt mobs performed in early to mid range.  Contract relax techniques for ER and subscapularis relaxation for ER mobility gains.    Vasopneumatic   Rt shoulder 34 deg low compression 10 mins  12/22/2021: Therex:             Supine wand ER to tolerance Rt shoulder 10 sec hold x 10 Seated table slides flexion 5 sec hold x 10 Seated table slides scaption 5 sec hold x 10 Seated table slides ER 5 sec hold x 10 Manual:  PROM Rt shoulder flexion, scaption, abduction with Grade 2 inferior jt mobs  Modalities:  Vasopneumatic : Rt shoulder 34 deg low compression 10 mins    PATIENT EDUCATION: 12/24/2021: Education details: HEP progression Person educated: Patient Education method: Consulting civil engineer, Demonstration, Corporate treasurer cues, Verbal cues, and Handouts Education comprehension: verbalized understanding, returned demonstration, verbal cues required, and tactile cues required     HOME EXERCISE PROGRAM: Access Code: [email or web address] URL: [email or web address] Date: 12/24/2021 Prepared by: Scot Jun  Exercises - Circular Shoulder Pendulum with Table Support  - 3-5 x daily - 7 x weekly - 1-2 sets - 10 reps - Seated Scapular Retraction  - 3-5 x daily - 7 x weekly - 1 sets - 10 reps - 3-5 hold - Supine Shoulder External Rotation in 45 Degrees Abduction AAROM with Dowel (Mirrored)  - 2-3 x daily - 7 x weekly - 1-2 sets - 10 reps - 5 hold - Seated Shoulder Flexion Towel Slide at Table Top  - 2-3 x daily - 7 x weekly - 1-2 sets - 10 reps - 5 hold - Supine Shoulder Flexion Extension AAROM with Dowel  - 2-3 x daily - 7 x weekly - 2-3 sets - 10 reps - 2-3 hold - Seated Shoulder Flexion AAROM with Pulley Behind (Mirrored)  - 2-3 x daily - 7 x weekly - 1 sets - 3 mins hold - Seated Shoulder Scaption AAROM with Pulley at Side (Mirrored)  - 2-3 x daily - 7 x weekly  - 1 sets - 3 mins hold   ASSESSMENT:   CLINICAL IMPRESSION: PROM measurement update today showed progress in all directions.  Continued muscle guarding and pain noted as primary restriction in mobility at this time.  Difficulty c transitioning towards AAROM but making slow but steady  gains in tolerance.  Continued skilled PT services indicated at this time.      OBJECTIVE IMPAIRMENTS decreased activity tolerance, decreased coordination, decreased endurance, decreased mobility, decreased ROM, decreased strength, hypomobility, increased edema, increased fascial restrictions, impaired perceived functional ability, increased muscle spasms, impaired flexibility, impaired UE functional use, improper body mechanics, postural dysfunction, and pain.    ACTIVITY LIMITATIONS cleaning, community activity, driving, meal prep, occupation, laundry, and yard work.    PERSONAL FACTORS  HTN, migraines, history of TIA, arthritis Rt shoulder  are also affecting patient's functional outcome.      REHAB POTENTIAL: Good   CLINICAL DECISION MAKING: Stable/uncomplicated   EVALUATION COMPLEXITY: Low     GOALS: Goals reviewed with patient? Yes Eval : 12/08/2021   Short term PT Goals (target date for Short term goals are 3 weeks 12/29/2021) Patient will demonstrate independent use of home exercise program to maintain progress from in clinic treatments. Goal status: MET 12/22/2021   Long term PT goals (target dates for all long term goals are 10 weeks  02/16/2022 )   1. Patient will demonstrate/report pain at worst less than or equal to 2/10 to facilitate minimal limitation in daily activity secondary to pain symptoms. Goal status: on going - assessed 12/24/2021   2. Patient will demonstrate independent use of home exercise program to facilitate ability to maintain/progress functional gains from skilled physical therapy services. Goal status:  on going - assessed 12/24/2021   3. Patient will demonstrate FOTO  outcome > or = 64 % to indicate reduced disability due to condition. Goal status:  on going - assessed 12/24/2021   4.  Patient will demonstrate Rt [place] joint mobility WFL to facilitate usual self care, dressing, reaching overhead at PLOF s limitation due to symptoms.  Goal status:  on going - assessed 12/24/2021   5.  Patient will demonstrate Rt UE MMT > or = 4/5 throughout to facilitate usual lifting, carrying in functional activity to PLOF s limitation.   Goal status:  on going - assessed 12/24/2021   6.  Patient will demonstrate/report patient specific functional scale reporting on average 8/10 or greater for return to PLOF.   Goal status:  on going - assessed 12/24/2021   7.  Patient will demonstrate/report return to work, sleep s restriction due to symptoms.  a.  Goal Status:  on going - assessed 12/24/2021     PLAN: PT FREQUENCY: 2x/week   PT DURATION: 10 weeks   PLANNED INTERVENTIONS:  Therapeutic exercises, Therapeutic activity, Neuro Muscular re-education, Balance training, Gait training, Patient/Family education, Joint mobilization, Stair training, DME instructions, Dry Needling, Electrical stimulation, Cryotherapy, Moist heat, Taping, Ultrasound, Ionotophoresis 58m/ml Dexamethasone, and Manual therapy.  All included unless contraindicated   PLAN FOR NEXT SESSION: Slow gains in progression due   PROM/AAROM for until 01/05/2022 per MD note  MScot Jun PT, DPT, OCS, ATC 12/30/21  10:03 AM

## 2022-01-01 ENCOUNTER — Encounter: Payer: Self-pay | Admitting: Rehabilitative and Restorative Service Providers"

## 2022-01-01 ENCOUNTER — Ambulatory Visit: Payer: BC Managed Care – PPO | Admitting: Rehabilitative and Restorative Service Providers"

## 2022-01-01 DIAGNOSIS — R600 Localized edema: Secondary | ICD-10-CM | POA: Diagnosis not present

## 2022-01-01 DIAGNOSIS — R293 Abnormal posture: Secondary | ICD-10-CM

## 2022-01-01 DIAGNOSIS — M25511 Pain in right shoulder: Secondary | ICD-10-CM | POA: Diagnosis not present

## 2022-01-01 DIAGNOSIS — G8929 Other chronic pain: Secondary | ICD-10-CM

## 2022-01-01 DIAGNOSIS — M6281 Muscle weakness (generalized): Secondary | ICD-10-CM

## 2022-01-01 NOTE — Therapy (Signed)
OUTPATIENT PHYSICAL THERAPY TREATMENT NOTE   Patient Name: Tammy Aguirre MRN: [id number] DOB:1961/06/03, 61 y.o., female Today's Date: 01/01/2022  END OF SESSION:   PT End of Session - 01/01/22 0812     Visit Number 6    Number of Visits 20    Date for PT Re-Evaluation 02/16/22    Authorization Type [employer] 30% coninsurance    PT Start Time 0807    PT Stop Time 0856    PT Time Calculation (min) 49 min    Activity Tolerance Patient tolerated treatment well    Behavior During Therapy WFL for tasks assessed/performed                 Past Medical History:  Diagnosis Date   Anemia    History of   Arthritis    Right Shoulder   History of TIA (transient ischemic attack)    Hypertension    Migraines    History of   PONV (postoperative nausea and vomiting)    Past Surgical History:  Procedure Laterality Date   ABDOMINAL HYSTERECTOMY     CESAREAN SECTION     COLONOSCOPY     NECK SURGERY     plate in neck   SHOULDER ARTHROSCOPY WITH OPEN ROTATOR CUFF REPAIR AND DISTAL CLAVICLE ACROMINECTOMY Right 11/24/2021   Procedure: RIGHT SHOULDER ARTHROSCOPIC SUBACROMIAL DECOMPRESSION WITH OPEN ROTATOR CUFF REPAIR AND DISTAL CLAVICLE ACROMINECTOMY;  Surgeon: [name], MD;  Location: [facility];  Service: Orthopedics;  Laterality: Right;   UPPER GI ENDOSCOPY     Patient Active Problem List   Diagnosis Date Noted   Complete tear of right rotator cuff 10/29/2021   Pain in right shoulder 09/24/2021   Anterior knee pain, right 10/09/2020   Overweight with body mass index (BMI) 25.0-29.9 10/20/2017   Endometriosis 10/20/2017   Hypertensive disorder 10/20/2017   Cyst of ovary 08/02/1996   Abnormal cervical Papanicolaou smear 08/03/1983    PCP: Seward Carol, MD   REFERRING PROVIDER: [name], MD   REFERRING DIAG: S46.011D (ICD-10-CM) - Traumatic complete tear of right rotator cuff, subsequent encounter  ONSET DATE: Surgery 11/24/2021  THERAPY DIAG:  Chronic  right shoulder pain  Muscle weakness (generalized)  Localized edema  Abnormal posture  PERTINENT HISTORY: 11/24/2021 surgery: SAD, open RTC repair and distal clavicle acrominectomy.  HTN, migraines, history of TIA, arthritis Rt shoulder  PRECAUTIONS: Shoulder - AAROM from 12/22/2021 to 01/05/2022  SUBJECTIVE: Pt indicated feeling no pain at rest today.  Pt stated doing ok after last visit.   PAIN:  NPRS scale: 0/10 Pain location: Rt shoulder Pain description: surgical, stinging, ache Aggravating factors: arm movement, nighttime Relieving factors: OTC medicine, sitting up instead of lying down at night   OBJECTIVE:    PATIENT SURVEYS:  12/08/2021: FOTO eval: 43  predicted:   64   Patient Specific Functional Scale (0 unable to 10 no difficulty) 1) Hair/self care:             Eval 12/08/2021 : 0/10 2) Food prep             Eval 12/08/2021 :  3/10 3) Computer work             Eval 12/08/2021 : 6/10   COGNITION: 12/08/2021: Overall cognitive status: Within functional limits for tasks assessed  SENSATION: 12/08/2021: WFL   POSTURE: 12/08/2021:  Wearing sling   UPPER EXTREMITY ROM:    ROM Right PROM in supine 12/08/2021 Left 12/08/2021 Right PROM in supine 12/16/2021 Rt PROM  Supine 12/22/2021 Rt PROM Supine 12/30/2021  Shoulder flexion 90, pain limited AROM in supine: 170 124, pain limited 130 deg c pain at end range 140 deg c pain end range  Shoulder extension         Shoulder abduction 85, pain limited    90 deg c pain 105 deg c pain end range  Shoulder adduction         Shoulder internal rotation Hand to belly c arm at side       Shoulder external rotation 0 deg c arm at side, pain limited 70 in 45 deg abduction AROM 20 deg c arm in 20 deg abduction, pain limited 18 deg arm in 20 deg abd c pain  40 deg in 45 deg abduction c end range pain/tightness  Elbow flexion         Elbow extension         Wrist flexion         Wrist extension          Wrist ulnar deviation         Wrist radial deviation         Wrist pronation         Wrist supination         (Blank rows = not tested)   UPPER EXTREMITY MMT:   MMT Right 12/08/2021 Not tested due to surgery Left 12/08/2021  Shoulder flexion      Shoulder extension      Shoulder abduction      Shoulder adduction      Shoulder internal rotation      Shoulder external rotation      Middle trapezius      Lower trapezius      Elbow flexion      Elbow extension      Wrist flexion      Wrist extension      Wrist ulnar deviation      Wrist radial deviation      Wrist pronation      Wrist supination      Grip strength (lbs)      (Blank rows = not tested)   JOINT MOBILITY TESTING:  12/08/2021:Early to mid range Rt GH joint mobility testing WFL at this time, muscle guarding noted overall.    PALPATION:  12/08/2021: Tenderness to touch surrounding Rt shoulder joint in all sides               TODAY'S TREATMENT:  01/01/2022:             Therex:                                  Supine AAROM protraction c 1 lb bar 3 sec hold 2 x 10    Supine AAROM 1 lb bar flexion 2 x 15 c 2-3 sec hold in end range   Standing wand AAROM 1 lb abduction Rt shoulder 2 x 10    UBE AAROM 4 mins fwd/back each way lvl 1.0   Standing scapular retraction 5 sec hold x 10                  Manual  PROM Rt shoulder flexion, scaption, abduction with g2-g3 inferior jt mobs performed in early to mid range.  Contract relax techniques for ER and subscapularis relaxation for ER mobility gains.    Vasopneumatic   Rt shoulder 34 deg low compression 10 mins  12/30/2021:             Therex:                                     Supine wand ER to tolerance Rt shoulder in 45 deg abduction 15 se x 5    Supine AAROM 1 lb bar flexion 2 x 15 c 2-3 sec hold in end range   Supine scapular retraction hold into table 5 sec x 10   UBE AAROM 3 mins fwd/back each way lvl 1.0                 Manual                          PROM Rt shoulder flexion, scaption, abduction with g2 inferior jt mobs performed in early to mid range.  Contract relax techniques for ER and subscapularis relaxation for ER mobility gains.    Vasopneumatic   Rt shoulder 34 deg low compression 10 mins  12/24/2021:             Therex:                                     Supine wand ER to tolerance Rt shoulder in 45 deg abduction 10 sec stretch, 5 sec contract/relax technique x 5   Supine AAROM 1 lb bar flexion x 10 c 2-3 sec hold in end range   Supine scapular retraction hold into table 5 sec x 10   Pulley flexion, scaption 2 mins, movement to tolerance.                  Manual                         PROM Rt shoulder flexion, scaption, abduction with g2 inferior jt mobs performed in early to mid range.  Contract relax techniques for ER and subscapularis relaxation for ER mobility gains.    Vasopneumatic   Rt shoulder 34 deg low compression 10 mins   PATIENT EDUCATION: 12/24/2021: Education details: HEP progression Person educated: Patient Education method: Explanation, Demonstration, Tactile cues, Verbal cues, and Handouts Education comprehension: verbalized understanding, returned demonstration, verbal cues required, and tactile cues required     HOME EXERCISE PROGRAM: Access Code: [email or web address] URL: [email or web address] Date: 01/01/2022 Prepared by: Scot Jun  Exercises - Circular Shoulder Pendulum with Table Support  - 3-5 x daily - 7 x weekly - 1-2 sets - 10 reps - Seated Scapular Retraction  - 3-5 x daily - 7 x weekly - 1 sets - 10 reps - 3-5 hold - Supine Shoulder External Rotation in 45 Degrees Abduction AAROM with Dowel (Mirrored)  - 2-3 x daily - 7 x weekly - 1-2 sets - 10 reps - 5 hold - Supine Shoulder Flexion Extension AAROM with Dowel  - 2-3 x daily - 7 x weekly - 2-3 sets - 10 reps - 2-3 hold - Seated Shoulder Flexion AAROM with Pulley Behind (Mirrored)  -  2-3 x daily - 7 x weekly - 1 sets - 3  mins hold - Seated Shoulder Scaption AAROM with Pulley at Side (Mirrored)  - 2-3 x daily - 7 x weekly - 1 sets - 3 mins hold - Standing Shoulder Abduction AAROM with Dowel (Mirrored)  - 1-2 x daily - 7 x weekly - 1-2 sets - 10 reps ASSESSMENT:   CLINICAL IMPRESSION: Posterior glide and ER mobility still showing most limitation and restriction (myofascial, capsular) with ability to improve during course of manual and intervention in clinic.  Pt tends to return to clinic c return of tightness and gain during the course of visit.  Pt closing in on time for transtioning to include AROM/strengthening (01/05/2022)     OBJECTIVE IMPAIRMENTS decreased activity tolerance, decreased coordination, decreased endurance, decreased mobility, decreased ROM, decreased strength, hypomobility, increased edema, increased fascial restrictions, impaired perceived functional ability, increased muscle spasms, impaired flexibility, impaired UE functional use, improper body mechanics, postural dysfunction, and pain.    ACTIVITY LIMITATIONS cleaning, community activity, driving, meal prep, occupation, laundry, and yard work.    PERSONAL FACTORS  HTN, migraines, history of TIA, arthritis Rt shoulder  are also affecting patient's functional outcome.      REHAB POTENTIAL: Good   CLINICAL DECISION MAKING: Stable/uncomplicated   EVALUATION COMPLEXITY: Low     GOALS: Goals reviewed with patient? Yes Eval : 12/08/2021   Short term PT Goals (target date for Short term goals are 3 weeks 12/29/2021) Patient will demonstrate independent use of home exercise program to maintain progress from in clinic treatments. Goal status: MET 12/22/2021   Long term PT goals (target dates for all long term goals are 10 weeks  02/16/2022 )   1. Patient will demonstrate/report pain at worst less than or equal to 2/10 to facilitate minimal limitation in daily activity secondary to pain symptoms. Goal status: on going - assessed 12/24/2021   2.  Patient will demonstrate independent use of home exercise program to facilitate ability to maintain/progress functional gains from skilled physical therapy services. Goal status:  on going - assessed 12/24/2021   3. Patient will demonstrate FOTO outcome > or = 64 % to indicate reduced disability due to condition. Goal status:  on going - assessed 12/24/2021   4.  Patient will demonstrate Rt [place] joint mobility WFL to facilitate usual self care, dressing, reaching overhead at PLOF s limitation due to symptoms.  Goal status:  on going - assessed 12/24/2021   5.  Patient will demonstrate Rt UE MMT > or = 4/5 throughout to facilitate usual lifting, carrying in functional activity to PLOF s limitation.   Goal status:  on going - assessed 12/24/2021   6.  Patient will demonstrate/report patient specific functional scale reporting on average 8/10 or greater for return to PLOF.   Goal status:  on going - assessed 12/24/2021   7.  Patient will demonstrate/report return to work, sleep s restriction due to symptoms.  a.  Goal Status:  on going - assessed 12/24/2021     PLAN: PT FREQUENCY: 2x/week   PT DURATION: 10 weeks   PLANNED INTERVENTIONS:  Therapeutic exercises, Therapeutic activity, Neuro Muscular re-education, Balance training, Gait training, Patient/Family education, Joint mobilization, Stair training, DME instructions, Dry Needling, Electrical stimulation, Cryotherapy, Moist heat, Taping, Ultrasound, Ionotophoresis 84m/ml Dexamethasone, and Manual therapy.  All included unless contraindicated   PLAN FOR NEXT SESSION: AAROM mobility improvements, manual for ER.    PROM/AAROM until 01/05/2022 per MD note  MScot Jun  PT, DPT, OCS, ATC 01/01/22  8:43 AM

## 2022-01-05 ENCOUNTER — Ambulatory Visit: Payer: BC Managed Care – PPO | Admitting: Rehabilitative and Restorative Service Providers"

## 2022-01-05 ENCOUNTER — Encounter: Payer: Self-pay | Admitting: Rehabilitative and Restorative Service Providers"

## 2022-01-05 DIAGNOSIS — R293 Abnormal posture: Secondary | ICD-10-CM

## 2022-01-05 DIAGNOSIS — R600 Localized edema: Secondary | ICD-10-CM | POA: Diagnosis not present

## 2022-01-05 DIAGNOSIS — M25511 Pain in right shoulder: Secondary | ICD-10-CM

## 2022-01-05 DIAGNOSIS — G8929 Other chronic pain: Secondary | ICD-10-CM

## 2022-01-05 DIAGNOSIS — M6281 Muscle weakness (generalized): Secondary | ICD-10-CM

## 2022-01-05 NOTE — Therapy (Signed)
OUTPATIENT PHYSICAL THERAPY TREATMENT NOTE   Patient Name: Tammy Aguirre MRN: [id number] DOB:[birth year], 61 y.o., female Today's Date: 01/05/2022  END OF SESSION:   PT End of Session - 01/05/22 0841     Visit Number 7    Number of Visits 20    Date for PT Re-Evaluation 02/16/22    Authorization Type BCBS 30% coninsurance    PT Start Time 0842    PT Stop Time 0933    PT Time Calculation (min) 51 min    Activity Tolerance Patient tolerated treatment well    Behavior During Therapy WFL for tasks assessed/performed                  Past Medical History:  Diagnosis Date   Anemia    History of   Arthritis    Right Shoulder   History of TIA (transient ischemic attack)    Hypertension    Migraines    History of   PONV (postoperative nausea and vomiting)    Past Surgical History:  Procedure Laterality Date   ABDOMINAL HYSTERECTOMY     CESAREAN SECTION     COLONOSCOPY     NECK SURGERY     plate in neck   SHOULDER ARTHROSCOPY WITH OPEN ROTATOR CUFF REPAIR AND DISTAL CLAVICLE ACROMINECTOMY Right 11/24/2021   Procedure: RIGHT SHOULDER ARTHROSCOPIC SUBACROMIAL DECOMPRESSION WITH OPEN ROTATOR CUFF REPAIR AND DISTAL CLAVICLE ACROMINECTOMY;  Surgeon: [name], MD;  Location: [facility];  Service: Orthopedics;  Laterality: Right;   UPPER GI ENDOSCOPY     Patient Active Problem List   Diagnosis Date Noted   Complete tear of right rotator cuff 10/29/2021   Pain in right shoulder 09/24/2021   Anterior knee pain, right 10/09/2020   Overweight with body mass index (BMI) 25.0-29.9 10/20/2017   Endometriosis 10/20/2017   Hypertensive disorder 10/20/2017   Cyst of ovary 08/02/1996   Abnormal cervical Papanicolaou smear 08/03/1983    PCP: Seward Carol, MD   REFERRING PROVIDER: [name], MD   REFERRING DIAG: S46.011D (ICD-10-CM) - Traumatic complete tear of right rotator cuff, subsequent encounter  ONSET DATE: Surgery 11/24/2021  THERAPY DIAG:  Chronic  right shoulder pain  Muscle weakness (generalized)  Localized edema  Abnormal posture  PERTINENT HISTORY: 11/24/2021 surgery: SAD, open RTC repair and distal clavicle acrominectomy.  HTN, migraines, history of TIA, arthritis Rt shoulder  PRECAUTIONS: Shoulder - AAROM from 12/22/2021 to 01/05/2022  SUBJECTIVE: Some stinging feel but nothing major.  Indicated still trying to move more.   PAIN:  NPRS scale: 1-2/10 Pain location: Rt shoulder Pain description: stinging, intermittent Aggravating factors:insidious Relieving factors: OTC medicine, sitting up instead of lying down at night   OBJECTIVE:    PATIENT SURVEYS:   12/08/2021: FOTO eval: 43  predicted:   64   Patient Specific Functional Scale (0 unable to 10 no difficulty) 1) Hair/self care:             Eval 12/08/2021 : 0/10 2) Food prep             Eval 12/08/2021 :  3/10 3) Computer work             Eval 12/08/2021 : 6/10   COGNITION: 12/08/2021: Overall cognitive status: Within functional limits for tasks assessed  SENSATION: 12/08/2021: WFL   POSTURE: 12/08/2021:  Wearing sling   UPPER EXTREMITY ROM:    ROM Right PROM in supine 12/08/2021 Left 12/08/2021 Right PROM in supine 12/16/2021 Rt PROM  Supine 12/22/2021 Rt PROM Supine 12/30/2021  Shoulder flexion 90, pain limited AROM in supine: 170 124, pain limited 130 deg c pain at end range 140 deg c pain end range  Shoulder extension         Shoulder abduction 85, pain limited    90 deg c pain 105 deg c pain end range  Shoulder adduction         Shoulder internal rotation Hand to belly c arm at side       Shoulder external rotation 0 deg c arm at side, pain limited 70 in 45 deg abduction AROM 20 deg c arm in 20 deg abduction, pain limited 18 deg arm in 20 deg abd c pain  40 deg in 45 deg abduction c end range pain/tightness  Elbow flexion         Elbow extension         Wrist flexion         Wrist extension         Wrist ulnar deviation          Wrist radial deviation         Wrist pronation         Wrist supination         (Blank rows = not tested)   UPPER EXTREMITY MMT:   MMT Right 12/08/2021 Not tested due to surgery Left 12/08/2021 Right 01/05/2022  (Minimal resistance testing today)  Shoulder flexion     2/5  Shoulder extension       Shoulder abduction     1+  Shoulder adduction       Shoulder internal rotation     3+/5  Shoulder external rotation     1+  Middle trapezius       Lower trapezius       Elbow flexion       Elbow extension       Wrist flexion       Wrist extension       Wrist ulnar deviation       Wrist radial deviation       Wrist pronation       Wrist supination       Grip strength (lbs)       (Blank rows = not tested)   JOINT MOBILITY TESTING:  12/08/2021:Early to mid range Rt GH joint mobility testing WFL at this time, muscle guarding noted overall.    PALPATION:  12/08/2021: Tenderness to touch surrounding Rt shoulder joint in all sides               TODAY'S TREATMENT:  01/05/2022:             Therex:                                  UBE AAROM 4 mins fwd/back each way lvl 1.0   Standing isometric flexion, abduction, ER, IR 5 sec on/off x 12 each way c arm at side painfree   Supine AAROM c 1 lb bar into flexion x 15   Supine AROM x 10 bilateral   Supine 90 deg flexion circles 20 cw, 20 ccw  Manual                         PROM Rt shoulder flexion, scaption, abduction with g2-g3 inferior jt mobs performed in early to mid range.  Mobilization c movement for ER c sustained Rt shoulder posterior glide   Vasopneumatic   Rt shoulder 34 deg low compression 10 mins  01/01/2022:             Therex:                                  Supine AAROM protraction c 1 lb bar 3 sec hold 2 x 10    Supine AAROM 1 lb bar flexion 2 x 15 c 2-3 sec hold in end range   Standing wand AAROM 1 lb abduction Rt shoulder 2 x 10    UBE AAROM 4 mins fwd/back each way lvl 1.0   Standing scapular  retraction 5 sec hold x 10                  Manual                         PROM Rt shoulder flexion, scaption, abduction with g2-g3 inferior jt mobs performed in early to mid range.  Contract relax techniques for ER and subscapularis relaxation for ER mobility gains.    Vasopneumatic   Rt shoulder 34 deg low compression 10 mins  12/30/2021:             Therex:                                     Supine wand ER to tolerance Rt shoulder in 45 deg abduction 15 se x 5    Supine AAROM 1 lb bar flexion 2 x 15 c 2-3 sec hold in end range   Supine scapular retraction hold into table 5 sec x 10   UBE AAROM 3 mins fwd/back each way lvl 1.0                 Manual                         PROM Rt shoulder flexion, scaption, abduction with g2 inferior jt mobs performed in early to mid range.  Contract relax techniques for ER and subscapularis relaxation for ER mobility gains.    Vasopneumatic   Rt shoulder 34 deg low compression 10 mins  PATIENT EDUCATION: 01/05/2022: Education details: HEP progression to include painfree isometrics Person educated: Patient Education method: Explanation, Demonstration, Tactile cues, Verbal cues, and Handouts Education comprehension: verbalized understanding, returned demonstration, verbal cues required, and tactile cues required     HOME EXERCISE PROGRAM: Access Code: [email or web address] URL: [email or web address] Date: 01/05/2022 Prepared by: Scot Jun  Exercises - Seated Scapular Retraction  - 3-5 x daily - 7 x weekly - 1 sets - 10 reps - 3-5 hold - Supine Shoulder External Rotation in 45 Degrees Abduction AAROM with Dowel (Mirrored)  - 2-3 x daily - 7 x weekly - 1-2 sets - 10 reps - 5 hold - Supine Shoulder Flexion Extension AAROM with Dowel  - 2-3 x daily - 7 x weekly - 2-3 sets - 10 reps - 2-3 hold -  Seated Shoulder Flexion AAROM with Pulley Behind (Mirrored)  - 2-3 x daily - 7 x weekly - 1 sets - 3 mins hold - Seated Shoulder Scaption AAROM with  Pulley at Side (Mirrored)  - 2-3 x daily - 7 x weekly - 1 sets - 3 mins hold - Standing Shoulder Abduction AAROM with Dowel (Mirrored)  - 1-2 x daily - 7 x weekly - 1-2 sets - 10 reps - Standing Isometric Shoulder Internal Rotation at Doorway  - 1-2 x daily - 7 x weekly - 1 sets - 10 reps - 5-10 hold - Standing Isometric Shoulder External Rotation with Doorway (Mirrored)  - 1-2 x daily - 7 x weekly - 1 sets - 10 reps - 5-10 hold - Standing Isometric Shoulder Flexion with Doorway - Arm Bent  - 1-2 x daily - 7 x weekly - 1 sets - 10 reps - 5-10 hold - Standing Isometric Shoulder Abduction with Doorway - Arm Bent (Mirrored)  - 1-2 x daily - 7 x weekly - 1 sets - 10 reps - 5-10 hold  ASSESSMENT:   CLINICAL IMPRESSION: Per initial MD note/referral, today was transitioning to include isometrics/AROM as tolerated.  Fair performance overall in isometrics c consistent cues given to avoid pain response in activity.  AROM difficult at this time in gravity reduced positioning but able to perform almost full range.  Continued skilled PT services indicated at this time.      OBJECTIVE IMPAIRMENTS decreased activity tolerance, decreased coordination, decreased endurance, decreased mobility, decreased ROM, decreased strength, hypomobility, increased edema, increased fascial restrictions, impaired perceived functional ability, increased muscle spasms, impaired flexibility, impaired UE functional use, improper body mechanics, postural dysfunction, and pain.    ACTIVITY LIMITATIONS cleaning, community activity, driving, meal prep, occupation, laundry, and yard work.    PERSONAL FACTORS  HTN, migraines, history of TIA, arthritis Rt shoulder  are also affecting patient's functional outcome.      REHAB POTENTIAL: Good   CLINICAL DECISION MAKING: Stable/uncomplicated   EVALUATION COMPLEXITY: Low     GOALS: Goals reviewed with patient? Yes Eval : 12/08/2021   Short term PT Goals (target date for Short term  goals are 3 weeks 12/29/2021) Patient will demonstrate independent use of home exercise program to maintain progress from in clinic treatments. Goal status: MET 12/22/2021   Long term PT goals (target dates for all long term goals are 10 weeks  02/16/2022 )   1. Patient will demonstrate/report pain at worst less than or equal to 2/10 to facilitate minimal limitation in daily activity secondary to pain symptoms. Goal status: on going - assessed 12/24/2021   2. Patient will demonstrate independent use of home exercise program to facilitate ability to maintain/progress functional gains from skilled physical therapy services. Goal status:  on going - assessed 12/24/2021   3. Patient will demonstrate FOTO outcome > or = 64 % to indicate reduced disability due to condition. Goal status:  on going - assessed 12/24/2021   4.  Patient will demonstrate Rt [place] joint mobility WFL to facilitate usual self care, dressing, reaching overhead at PLOF s limitation due to symptoms.  Goal status:  on going - assessed 12/24/2021   5.  Patient will demonstrate Rt UE MMT > or = 4/5 throughout to facilitate usual lifting, carrying in functional activity to PLOF s limitation.   Goal status:  on going - assessed 12/24/2021   6.  Patient will demonstrate/report patient specific functional scale reporting on average 8/10 or greater for return  to PLOF.   Goal status:  on going - assessed 12/24/2021   7.  Patient will demonstrate/report return to work, sleep s restriction due to symptoms.  a.  Goal Status:  on going - assessed 12/24/2021     PLAN: PT FREQUENCY: 2x/week   PT DURATION: 10 weeks   PLANNED INTERVENTIONS:  Therapeutic exercises, Therapeutic activity, Neuro Muscular re-education, Balance training, Gait training, Patient/Family education, Joint mobilization, Stair training, DME instructions, Dry Needling, Electrical stimulation, Cryotherapy, Moist heat, Taping, Ultrasound, Ionotophoresis 53m/ml Dexamethasone, and  Manual therapy.  All included unless contraindicated   PLAN FOR NEXT SESSION: Able to transition into active movement based off symptoms (AAROM to AROM in gravity reduced positioning).  Painfree isometrics review.  FOTO reassessment prior to next week's MD visit.   MScot Jun PT, DPT, OCS, ATC 01/05/22  9:27 AM

## 2022-01-07 ENCOUNTER — Ambulatory Visit: Payer: BC Managed Care – PPO | Admitting: Rehabilitative and Restorative Service Providers"

## 2022-01-07 ENCOUNTER — Encounter: Payer: Self-pay | Admitting: Rehabilitative and Restorative Service Providers"

## 2022-01-07 DIAGNOSIS — R293 Abnormal posture: Secondary | ICD-10-CM

## 2022-01-07 DIAGNOSIS — M25511 Pain in right shoulder: Secondary | ICD-10-CM

## 2022-01-07 DIAGNOSIS — M6281 Muscle weakness (generalized): Secondary | ICD-10-CM

## 2022-01-07 DIAGNOSIS — R600 Localized edema: Secondary | ICD-10-CM

## 2022-01-07 DIAGNOSIS — G8929 Other chronic pain: Secondary | ICD-10-CM

## 2022-01-07 NOTE — Therapy (Signed)
OUTPATIENT PHYSICAL THERAPY TREATMENT NOTE   Patient Name: Tammy Aguirre MRN: [id number] DOB:Sep 23, 1960, 61 y.o., female Today's Date: 01/07/2022  END OF SESSION:   PT End of Session - 01/07/22 0848     Visit Number 8    Number of Visits 20    Date for PT Re-Evaluation 02/16/22    Authorization Type [employer] 30% coninsurance    PT Start Time 0840    PT Stop Time 0930    PT Time Calculation (min) 50 min    Activity Tolerance Patient tolerated treatment well    Behavior During Therapy WFL for tasks assessed/performed                   Past Medical History:  Diagnosis Date   Anemia    History of   Arthritis    Right Shoulder   History of TIA (transient ischemic attack)    Hypertension    Migraines    History of   PONV (postoperative nausea and vomiting)    Past Surgical History:  Procedure Laterality Date   ABDOMINAL HYSTERECTOMY     CESAREAN SECTION     COLONOSCOPY     NECK SURGERY     plate in neck   SHOULDER ARTHROSCOPY WITH OPEN ROTATOR CUFF REPAIR AND DISTAL CLAVICLE ACROMINECTOMY Right 11/24/2021   Procedure: RIGHT SHOULDER ARTHROSCOPIC SUBACROMIAL DECOMPRESSION WITH OPEN ROTATOR CUFF REPAIR AND DISTAL CLAVICLE ACROMINECTOMY;  Surgeon: [name], MD;  Location: [facility];  Service: Orthopedics;  Laterality: Right;   UPPER GI ENDOSCOPY     Patient Active Problem List   Diagnosis Date Noted   Complete tear of right rotator cuff 10/29/2021   Pain in right shoulder 09/24/2021   Anterior knee pain, right 10/09/2020   Overweight with body mass index (BMI) 25.0-29.9 10/20/2017   Endometriosis 10/20/2017   Hypertensive disorder 10/20/2017   Cyst of ovary 08/02/1996   Abnormal cervical Papanicolaou smear 08/03/1983    PCP: Seward Carol, MD   REFERRING PROVIDER: [name], MD   REFERRING DIAG: S46.011D (ICD-10-CM) - Traumatic complete tear of right rotator cuff, subsequent encounter  ONSET DATE: Surgery 11/24/2021  THERAPY DIAG:   Chronic right shoulder pain  Muscle weakness (generalized)  Localized edema  Abnormal posture  PERTINENT HISTORY: 11/24/2021 surgery: SAD, open RTC repair and distal clavicle acrominectomy.  HTN, migraines, history of TIA, arthritis Rt shoulder  PRECAUTIONS: Shoulder - AAROM from 12/22/2021 to 01/05/2022  SUBJECTIVE: Pt indicated general soreness in last day or two.  End range tightness still noted  PAIN:  NPRS scale: 2/10 Pain location: Rt shoulder Pain description: sore Aggravating factors: general soreness Relieving factors: OTC medicine   OBJECTIVE:    PATIENT SURVEYS:   12/08/2021: FOTO eval: 43  predicted:   64   Patient Specific Functional Scale (0 unable to 10 no difficulty) 1) Hair/self care:             01/07/2022: 5/10 Eval 12/08/2021 : 0/10 2) Food prep 01/07/2022:      7/10         Eval 12/08/2021 :  3/10 3) Computer work             01/07/2022:  6/10 Eval 12/08/2021 : 6/10   COGNITION: 12/08/2021: Overall cognitive status: Within functional limits for tasks assessed  SENSATION: 12/08/2021: WFL   POSTURE: 12/08/2021:  Wearing sling   UPPER EXTREMITY ROM:    ROM Right PROM in supine 12/08/2021 Left 12/08/2021 Right PROM in supine 12/16/2021 Rt PROM  Supine 12/22/2021 Rt PROM Supine 12/30/2021  Shoulder flexion 90, pain limited AROM in supine: 170 124, pain limited 130 deg c pain at end range 140 deg c pain end range  Shoulder extension         Shoulder abduction 85, pain limited    90 deg c pain 105 deg c pain end range  Shoulder adduction         Shoulder internal rotation Hand to belly c arm at side       Shoulder external rotation 0 deg c arm at side, pain limited 70 in 45 deg abduction AROM 20 deg c arm in 20 deg abduction, pain limited 18 deg arm in 20 deg abd c pain  40 deg in 45 deg abduction c end range pain/tightness  Elbow flexion         Elbow extension         Wrist flexion         Wrist extension         Wrist  ulnar deviation         Wrist radial deviation         Wrist pronation         Wrist supination         (Blank rows = not tested)   UPPER EXTREMITY MMT:   MMT Right 12/08/2021 Not tested due to surgery Left 12/08/2021 Right 01/05/2022  (Minimal resistance testing today)  Shoulder flexion     2/5  Shoulder extension       Shoulder abduction     1+  Shoulder adduction       Shoulder internal rotation     3+/5  Shoulder external rotation     1+  Middle trapezius       Lower trapezius       Elbow flexion       Elbow extension       Wrist flexion       Wrist extension       Wrist ulnar deviation       Wrist radial deviation       Wrist pronation       Wrist supination       Grip strength (lbs)       (Blank rows = not tested)   JOINT MOBILITY TESTING:  12/08/2021:Early to mid range Rt GH joint mobility testing WFL at this time, muscle guarding noted overall.    PALPATION:  12/08/2021: Tenderness to touch surrounding Rt shoulder joint in all sides               TODAY'S TREATMENT:  01/07/2022:             Therex:                                  Pulleys flexion, abduction 3 mins each way    Sidelying AROM abduction    Supine AAROM c 1 lb bar into flexion x 15   Supine AROM x 15 bilateral   Supine 90 deg flexion circles 2x 20 cw, 2x 20 ccw 1 lb weight   Standing tband rows green 2 x 15, gh ext 2 x 15  Vasopneumatic   Rt shoulder 34 deg low compression 10 mins  01/05/2022:             Therex:                                  UBE AAROM 4 mins fwd/back each way lvl 1.0   Standing isometric flexion, abduction, ER, IR 5 sec on/off x 12 each way c arm at side painfree   Supine AAROM c 1 lb bar into flexion x 15   Supine AROM x 10 bilateral   Supine 90 deg flexion circles 20 cw, 20 ccw                 Manual                         PROM Rt shoulder flexion, scaption, abduction with g2-g3 inferior jt mobs performed in early to mid range.  Mobilization c movement for ER c  sustained Rt shoulder posterior glide   Vasopneumatic   Rt shoulder 34 deg low compression 10 mins  01/01/2022:             Therex:                                  Supine AAROM protraction c 1 lb bar 3 sec hold 2 x 10    Supine AAROM 1 lb bar flexion 2 x 15 c 2-3 sec hold in end range   Standing wand AAROM 1 lb abduction Rt shoulder 2 x 10    UBE AAROM 4 mins fwd/back each way lvl 1.0   Standing scapular retraction 5 sec hold x 10                  Manual                         PROM Rt shoulder flexion, scaption, abduction with g2-g3 inferior jt mobs performed in early to mid range.  Contract relax techniques for ER and subscapularis relaxation for ER mobility gains.    Vasopneumatic   Rt shoulder 34 deg low compression 10 mins  PATIENT EDUCATION: 01/05/2022: Education details: HEP progression to include painfree isometrics Person educated: Patient Education method: Explanation, Demonstration, Tactile cues, Verbal cues, and Handouts Education comprehension: verbalized understanding, returned demonstration, verbal cues required, and tactile cues required     HOME EXERCISE PROGRAM: Access Code: [email or web address] URL: [email or web address] Date: 01/05/2022 Prepared by: Scot Jun  Exercises - Seated Scapular Retraction  - 3-5 x daily - 7 x weekly - 1 sets - 10 reps - 3-5 hold - Supine Shoulder External Rotation in 45 Degrees Abduction AAROM with Dowel (Mirrored)  - 2-3 x daily - 7 x weekly - 1-2 sets - 10 reps - 5 hold - Supine Shoulder Flexion Extension AAROM with Dowel  - 2-3 x daily - 7 x weekly - 2-3 sets - 10 reps - 2-3 hold - Seated Shoulder Flexion AAROM with Pulley Behind (Mirrored)  - 2-3 x daily - 7 x weekly - 1 sets - 3 mins hold - Seated Shoulder Scaption AAROM with Pulley at Side (Mirrored)  - 2-3 x daily - 7 x weekly - 1 sets - 3 mins hold - Standing Shoulder Abduction AAROM with Dowel (Mirrored)  -  1-2 x daily - 7 x weekly - 1-2 sets - 10 reps - Standing  Isometric Shoulder Internal Rotation at Doorway  - 1-2 x daily - 7 x weekly - 1 sets - 10 reps - 5-10 hold - Standing Isometric Shoulder External Rotation with Doorway (Mirrored)  - 1-2 x daily - 7 x weekly - 1 sets - 10 reps - 5-10 hold - Standing Isometric Shoulder Flexion with Doorway - Arm Bent  - 1-2 x daily - 7 x weekly - 1 sets - 10 reps - 5-10 hold - Standing Isometric Shoulder Abduction with Doorway - Arm Bent (Mirrored)  - 1-2 x daily - 7 x weekly - 1 sets - 10 reps - 5-10 hold  ASSESSMENT:   CLINICAL IMPRESSION: Pt demonstrated mild improvements in AROM in supine for flexion.  Difficulty noted in abduction attempts today (held from HEP at this time).  Continued skilled PT services c progressive AROM/strengthening as appropriate indicated at this time.      OBJECTIVE IMPAIRMENTS decreased activity tolerance, decreased coordination, decreased endurance, decreased mobility, decreased ROM, decreased strength, hypomobility, increased edema, increased fascial restrictions, impaired perceived functional ability, increased muscle spasms, impaired flexibility, impaired UE functional use, improper body mechanics, postural dysfunction, and pain.    ACTIVITY LIMITATIONS cleaning, community activity, driving, meal prep, occupation, laundry, and yard work.    PERSONAL FACTORS  HTN, migraines, history of TIA, arthritis Rt shoulder  are also affecting patient's functional outcome.      REHAB POTENTIAL: Good   CLINICAL DECISION MAKING: Stable/uncomplicated   EVALUATION COMPLEXITY: Low     GOALS: Goals reviewed with patient? Yes Eval : 12/08/2021   Short term PT Goals (target date for Short term goals are 3 weeks 12/29/2021) Patient will demonstrate independent use of home exercise program to maintain progress from in clinic treatments. Goal status: MET 12/22/2021   Long term PT goals (target dates for all long term goals are 10 weeks  02/16/2022 )   1. Patient will demonstrate/report pain at  worst less than or equal to 2/10 to facilitate minimal limitation in daily activity secondary to pain symptoms. Goal status: on going - assessed 12/24/2021   2. Patient will demonstrate independent use of home exercise program to facilitate ability to maintain/progress functional gains from skilled physical therapy services. Goal status:  on going - assessed 12/24/2021   3. Patient will demonstrate FOTO outcome > or = 64 % to indicate reduced disability due to condition. Goal status:  on going - assessed 12/24/2021   4.  Patient will demonstrate Rt [place] joint mobility WFL to facilitate usual self care, dressing, reaching overhead at PLOF s limitation due to symptoms.  Goal status:  on going - assessed 12/24/2021   5.  Patient will demonstrate Rt UE MMT > or = 4/5 throughout to facilitate usual lifting, carrying in functional activity to PLOF s limitation.   Goal status:  on going - assessed 12/24/2021   6.  Patient will demonstrate/report patient specific functional scale reporting on average 8/10 or greater for return to PLOF.   Goal status:  on going - assessed 12/24/2021   7.  Patient will demonstrate/report return to work, sleep s restriction due to symptoms.  a.  Goal Status:  on going - assessed 12/24/2021     PLAN: PT FREQUENCY: 2x/week   PT DURATION: 10 weeks   PLANNED INTERVENTIONS:  Therapeutic exercises, Therapeutic activity, Neuro Muscular re-education, Balance training, Gait training, Patient/Family education, Joint mobilization, Stair training, DME instructions,  Dry Needling, Electrical stimulation, Cryotherapy, Moist heat, Taping, Ultrasound, Ionotophoresis 11m/ml Dexamethasone, and Manual therapy.  All included unless contraindicated   PLAN FOR NEXT SESSION: Able to transition into active movement based off symptoms (AAROM to AROM in gravity reduced positioning).  ROM measurements, FOTO reassessment  MScot Jun PT, DPT, OCS, ATC 01/07/22  9:21 AM

## 2022-01-12 ENCOUNTER — Encounter: Payer: Self-pay | Admitting: Orthopaedic Surgery

## 2022-01-12 ENCOUNTER — Ambulatory Visit (INDEPENDENT_AMBULATORY_CARE_PROVIDER_SITE_OTHER): Payer: BC Managed Care – PPO | Admitting: Orthopaedic Surgery

## 2022-01-12 ENCOUNTER — Ambulatory Visit: Payer: BC Managed Care – PPO | Admitting: Rehabilitative and Restorative Service Providers"

## 2022-01-12 ENCOUNTER — Encounter: Payer: Self-pay | Admitting: Rehabilitative and Restorative Service Providers"

## 2022-01-12 ENCOUNTER — Encounter: Payer: BC Managed Care – PPO | Admitting: Rehabilitative and Restorative Service Providers"

## 2022-01-12 DIAGNOSIS — R600 Localized edema: Secondary | ICD-10-CM | POA: Diagnosis not present

## 2022-01-12 DIAGNOSIS — S46011D Strain of muscle(s) and tendon(s) of the rotator cuff of right shoulder, subsequent encounter: Secondary | ICD-10-CM

## 2022-01-12 DIAGNOSIS — M25511 Pain in right shoulder: Secondary | ICD-10-CM

## 2022-01-12 DIAGNOSIS — R293 Abnormal posture: Secondary | ICD-10-CM | POA: Diagnosis not present

## 2022-01-12 DIAGNOSIS — G8929 Other chronic pain: Secondary | ICD-10-CM

## 2022-01-12 DIAGNOSIS — M6281 Muscle weakness (generalized): Secondary | ICD-10-CM

## 2022-01-12 NOTE — Progress Notes (Signed)
Office Visit Note   Patient: Tammy Aguirre           Date of Birth: Nov 16, 1960           MRN: [id number] Visit Date: 01/12/2022              Requested by: Renford Dills, MD [address] [place],  [place] [address] PCP: Renford Dills, MD   Assessment & Plan: Visit Diagnoses:  1. Traumatic complete tear of right rotator cuff, subsequent encounter     Plan: 6 weeks status post rotator cuff tear repair right shoulder with a mini approach.  Also had NSAID and DCR.  Has been going to physical therapy but still little bit tight.  I can abduct to about 90 degrees and flex about 100 degrees.  Has an early adhesive capsulitis.  Discontinue the sling and continue with physical therapy.  Instructed in home exercises for range of motion and reevaluate in 2-week.  Might be a candidate for closed manipulation if no improvement over 3 to 80-month  Follow-Up Instructions: Return in about 2 weeks (around 01/26/2022).   Orders:  No orders of the defined types were placed in this encounter.  No orders of the defined types were placed in this encounter.     Procedures: No procedures performed   Clinical Data: No additional findings.   Subjective: Chief Complaint  Patient presents with   Right Shoulder - Routine Post Op   Patient presents today s/p right shoulder scope SAD, RCR, distal clavicle acrominectomy preformed on 11/24/2021. Patient states that she has been attending physical therapy twice a week which she feels like has been helping with her recovery. At this time patient feels slight aching pains however was informed by therapist that pain is mostly muscle related, slight stinging pains along incision however this is not constant. Patient does report that sleeping has gotten better, however discomfort with sling. OTC advil is being used for pain.   Review of Systems   Objective: Vital Signs: There were no vitals taken for this visit.  Physical Exam  Ortho Exam  right shoulder incisions healing without problem.  I can abduct about 90 degrees passively and flex about 100 degrees at which point shoulder was tight.  No impingement.  Wounds are healing without problem.  Neurologically intact  Specialty Comments:  No specialty comments available.  Imaging: No results found.   PMFS History: Patient Active Problem List   Diagnosis Date Noted   Complete tear of right rotator cuff 10/29/2021   Pain in right shoulder 09/24/2021   Anterior knee pain, right 10/09/2020   Overweight with body mass index (BMI) 25.0-29.9 10/20/2017   Endometriosis 10/20/2017   Hypertensive disorder 10/20/2017   Cyst of ovary 08/02/1996   Abnormal cervical Papanicolaou smear 08/03/1983   Past Medical History:  Diagnosis Date   Anemia    History of   Arthritis    Right Shoulder   History of TIA (transient ischemic attack)    Hypertension    Migraines    History of   PONV (postoperative nausea and vomiting)     History reviewed. No pertinent family history.  Past Surgical History:  Procedure Laterality Date   ABDOMINAL HYSTERECTOMY     CESAREAN SECTION     COLONOSCOPY     NECK SURGERY     plate in neck   SHOULDER ARTHROSCOPY WITH OPEN ROTATOR CUFF REPAIR AND DISTAL CLAVICLE ACROMINECTOMY Right 11/24/2021   Procedure: RIGHT SHOULDER ARTHROSCOPIC  SUBACROMIAL DECOMPRESSION WITH OPEN ROTATOR CUFF REPAIR AND DISTAL CLAVICLE ACROMINECTOMY;  Surgeon: Valeria Batman, MD;  Location: [facility];  Service: Orthopedics;  Laterality: Right;   UPPER GI ENDOSCOPY     Social History   Occupational History   Not on file  Tobacco Use   Smoking status: Never   Smokeless tobacco: Never  Vaping Use   Vaping Use: Never used  Substance and Sexual Activity   Alcohol use: No   Drug use: No   Sexual activity: Yes    Birth control/protection: Surgical

## 2022-01-12 NOTE — Therapy (Signed)
OUTPATIENT PHYSICAL THERAPY TREATMENT NOTE /PROGRESS NOTE   Patient Name: Tammy Aguirre MRN: [id number] DOB:21-Aug-1960, 61 y.o., female Today's Date: 01/12/2022  Progress Note Reporting Period 12/08/2021 to 01/12/2022  See note below for Objective Data and Assessment of Progress/Goals.      END OF SESSION:   PT End of Session - 01/12/22 1358     Visit Number 9    Number of Visits 20    Date for PT Re-Evaluation 02/16/22    Authorization Type [employer] 30% coinsurance    PT Start Time 1347    PT Stop Time 1427    PT Time Calculation (min) 40 min    Activity Tolerance Patient limited by pain    Behavior During Therapy WFL for tasks assessed/performed                Past Medical History:  Diagnosis Date   Anemia    History of   Arthritis    Right Shoulder   History of TIA (transient ischemic attack)    Hypertension    Migraines    History of   PONV (postoperative nausea and vomiting)    Past Surgical History:  Procedure Laterality Date   ABDOMINAL HYSTERECTOMY     CESAREAN SECTION     COLONOSCOPY     NECK SURGERY     plate in neck   SHOULDER ARTHROSCOPY WITH OPEN ROTATOR CUFF REPAIR AND DISTAL CLAVICLE ACROMINECTOMY Right 11/24/2021   Procedure: RIGHT SHOULDER ARTHROSCOPIC SUBACROMIAL DECOMPRESSION WITH OPEN ROTATOR CUFF REPAIR AND DISTAL CLAVICLE ACROMINECTOMY;  Surgeon: [name], MD;  Location: [facility];  Service: Orthopedics;  Laterality: Right;   UPPER GI ENDOSCOPY     Patient Active Problem List   Diagnosis Date Noted   Complete tear of right rotator cuff 10/29/2021   Pain in right shoulder 09/24/2021   Anterior knee pain, right 10/09/2020   Overweight with body mass index (BMI) 25.0-29.9 10/20/2017   Endometriosis 10/20/2017   Hypertensive disorder 10/20/2017   Cyst of ovary 08/02/1996   Abnormal cervical Papanicolaou smear 08/03/1983    PCP: Seward Carol, MD   REFERRING PROVIDER: [name], MD   REFERRING DIAG: S46.011D  (ICD-10-CM) - Traumatic complete tear of right rotator cuff, subsequent encounter  ONSET DATE: Surgery 11/24/2021  THERAPY DIAG:  Chronic right shoulder pain  Muscle weakness (generalized)  Localized edema  Abnormal posture  PERTINENT HISTORY: 11/24/2021 surgery: SAD, open RTC repair and distal clavicle acrominectomy.  HTN, migraines, history of TIA, arthritis Rt shoulder  PRECAUTIONS: Shoulder - AAROM from 12/22/2021 to 01/05/2022  SUBJECTIVE:  Pt indicated stiffness increased today.  Pt indicated occassionally painful.  Some feeling of spasms reported.   Rating overall improvement to 60 % normal.   PAIN:  NPRS scale: 1-2/10 Pain location: Rt shoulder Pain description: sore Aggravating factors: general soreness Relieving factors: OTC medicine   OBJECTIVE:    PATIENT SURVEYS:  01/12/2022: FOTO update 46  12/08/2021: FOTO eval: 43  predicted:   64   Patient Specific Functional Scale (0 unable to 10 no difficulty) 1) Hair/self care:             01/07/2022: 5/10 Eval 12/08/2021 : 0/10 2) Food prep 01/07/2022:      7/10         Eval 12/08/2021 :  3/10 3) Computer work             01/07/2022:  6/10 Eval 12/08/2021 : 6/10   COGNITION: 12/08/2021: Overall cognitive status: Within  functional limits for tasks assessed                                     SENSATION: 12/08/2021: WFL   POSTURE: 12/08/2021:  Wearing sling   UPPER EXTREMITY ROM:    ROM Right PROM in supine 12/08/2021 Left 12/08/2021 Right PROM in supine 12/16/2021 Rt PROM  Supine 12/22/2021 Rt PROM Supine 12/30/2021 Rt AROM in supine 01/12/2022  Shoulder flexion 90, pain limited AROM in supine: 170 124, pain limited 130 deg c pain at end range 140 deg c pain end range 125 deg end range pain  Shoulder extension          Shoulder abduction 85, pain limited    90 deg c pain 105 deg c pain end range 90 AROM, 104 PROM end range pain  Shoulder adduction          Shoulder internal rotation Hand to belly c arm at side         Shoulder external rotation 0 deg c arm at side, pain limited 70 in 45 deg abduction AROM 20 deg c arm in 20 deg abduction, pain limited 18 deg arm in 20 deg abd c pain  40 deg in 45 deg abduction c end range pain/tightness AROM 30, PROM 40 deg in 45 deg abduction c end range pain/tightness  Elbow flexion          Elbow extension          Wrist flexion          Wrist extension          Wrist ulnar deviation          Wrist radial deviation          Wrist pronation          Wrist supination          (Blank rows = not tested)   UPPER EXTREMITY MMT:   MMT Right 12/08/2021 Not tested due to surgery Left 12/08/2021 Right 01/05/2022  (Minimal resistance testing today)  Shoulder flexion     2/5  Shoulder extension       Shoulder abduction     1+  Shoulder adduction       Shoulder internal rotation     3+/5  Shoulder external rotation     1+  Middle trapezius       Lower trapezius       Elbow flexion       Elbow extension       Wrist flexion       Wrist extension       Wrist ulnar deviation       Wrist radial deviation       Wrist pronation       Wrist supination       Grip strength (lbs)       (Blank rows = not tested)   JOINT MOBILITY TESTING:  12/08/2021:Early to mid range Rt GH joint mobility testing WFL at this time, muscle guarding noted overall.    PALPATION:  12/08/2021: Tenderness to touch surrounding Rt shoulder joint in all sides               TODAY'S TREATMENT:   01/12/2022:             Therex:  Pulleys flexion, abduction 3 mins each way    Sidelying AROM abduction    Supine AAROM c 2 lb bar into flexion 2 x 10   Supine AROM x10, x 15 bilateral   Supine 90 deg flexion circles 2x 20 cw, 2x 20 ccw 1 lb weight     Manual:  PROM all directions.  Mobilization c movement(active and passie) for ER c sustained posterior glide.  Inferior g3 mobilizations in flexion/scaption.      01/07/2022:             Therex:                                   Pulleys flexion, abduction 3 mins each way    Sidelying AROM abduction    Supine AAROM c 1 lb bar into flexion x 15   Supine AROM x 15 bilateral   Supine 90 deg flexion circles 2x 20 cw, 2x 20 ccw 1 lb weight   Standing tband rows green 2 x 15, gh ext 2 x 15                Vasopneumatic   Rt shoulder 34 deg low compression 10 mins  01/05/2022:             Therex:                                  UBE AAROM 4 mins fwd/back each way lvl 1.0   Standing isometric flexion, abduction, ER, IR 5 sec on/off x 12 each way c arm at side painfree   Supine AAROM c 1 lb bar into flexion x 15   Supine AROM x 10 bilateral   Supine 90 deg flexion circles 20 cw, 20 ccw                 Manual                         PROM Rt shoulder flexion, scaption, abduction with g2-g3 inferior jt mobs performed in early to mid range.  Mobilization c movement for ER c sustained Rt shoulder posterior glide   Vasopneumatic   Rt shoulder 34 deg low compression 10 mins  PATIENT EDUCATION: 01/05/2022: Education details: HEP progression to include painfree isometrics Person educated: Patient Education method: Explanation, Demonstration, Tactile cues, Verbal cues, and Handouts Education comprehension: verbalized understanding, returned demonstration, verbal cues required, and tactile cues required     HOME EXERCISE PROGRAM: Access Code: [email or web address] URL: [email or web address] Date: 01/05/2022 Prepared by: Scot Jun  Exercises - Seated Scapular Retraction  - 3-5 x daily - 7 x weekly - 1 sets - 10 reps - 3-5 hold - Supine Shoulder External Rotation in 45 Degrees Abduction AAROM with [place]el (Mirrored)  - 2-3 x daily - 7 x weekly - 1-2 sets - 10 reps - 5 hold - Supine Shoulder Flexion Extension AAROM with Dowel  - 2-3 x daily - 7 x weekly - 2-3 sets - 10 reps - 2-3 hold - Seated Shoulder Flexion AAROM with Pulley Behind (Mirrored)  - 2-3 x daily - 7 x weekly - 1 sets - 3 mins hold - Seated Shoulder  Scaption AAROM with Pulley at Side (Mirrored)  - 2-3 x daily - 7 x weekly - 1 sets - 3 mins hold -  Standing Shoulder Abduction AAROM with Dowel (Mirrored)  - 1-2 x daily - 7 x weekly - 1-2 sets - 10 reps - Standing Isometric Shoulder Internal Rotation at Doorway  - 1-2 x daily - 7 x weekly - 1 sets - 10 reps - 5-10 hold - Standing Isometric Shoulder External Rotation with Doorway (Mirrored)  - 1-2 x daily - 7 x weekly - 1 sets - 10 reps - 5-10 hold - Standing Isometric Shoulder Flexion with Doorway - Arm Bent  - 1-2 x daily - 7 x weekly - 1 sets - 10 reps - 5-10 hold - Standing Isometric Shoulder Abduction with Doorway - Arm Bent (Mirrored)  - 1-2 x daily - 7 x weekly - 1 sets - 10 reps - 5-10 hold  ASSESSMENT:   CLINICAL IMPRESSION: Pt has attended 9 visits overall during course of treatment, reporting mild pain complaints on most days at this time, mainly soreness/tightness.  See objective data for updated information regarding presentation.  Pt has demonstrated gains in mobility but continued presentation of Rt shoulder pain c mobility, strength deficits primary at this time and impacting function.  Continued skilled PT services indicated.      OBJECTIVE IMPAIRMENTS decreased activity tolerance, decreased coordination, decreased endurance, decreased mobility, decreased ROM, decreased strength, hypomobility, increased edema, increased fascial restrictions, impaired perceived functional ability, increased muscle spasms, impaired flexibility, impaired UE functional use, improper body mechanics, postural dysfunction, and pain.    ACTIVITY LIMITATIONS cleaning, community activity, driving, meal prep, occupation, laundry, and yard work.    PERSONAL FACTORS  HTN, migraines, history of TIA, arthritis Rt shoulder  are also affecting patient's functional outcome.      REHAB POTENTIAL: Good   CLINICAL DECISION MAKING: Stable/uncomplicated   EVALUATION COMPLEXITY: Low     GOALS: Goals reviewed  with patient? Yes Eval : 12/08/2021   Short term PT Goals (target date for Short term goals are 3 weeks 12/29/2021) Patient will demonstrate independent use of home exercise program to maintain progress from in clinic treatments. Goal status: MET 12/22/2021   Long term PT goals (target dates for all long term goals are 10 weeks  02/16/2022 )   1. Patient will demonstrate/report pain at worst less than or equal to 2/10 to facilitate minimal limitation in daily activity secondary to pain symptoms. Goal status: on going - assessed 01/12/2022   2. Patient will demonstrate independent use of home exercise program to facilitate ability to maintain/progress functional gains from skilled physical therapy services. Goal status:  on going - assessed 01/12/2022   3. Patient will demonstrate FOTO outcome > or = 64 % to indicate reduced disability due to condition. Goal status:  on going - assessed 01/12/2022   4.  Patient will demonstrate Rt [place] joint mobility WFL to facilitate usual self care, dressing, reaching overhead at PLOF s limitation due to symptoms.  Goal status: on going - assessed 01/12/2022   5.  Patient will demonstrate Rt UE MMT > or = 4/5 throughout to facilitate usual lifting, carrying in functional activity to PLOF s limitation.   Goal status:  on going - assessed 01/12/2022   6.  Patient will demonstrate/report patient specific functional scale reporting on average 8/10 or greater for return to PLOF.   Goal status:on going - assessed 01/12/2022   7.  Patient will demonstrate/report return to work, sleep s restriction due to symptoms.  a.  Goal Status:  on going - assessed 01/12/2022     PLAN: PT FREQUENCY: 2x/week  PT DURATION: 10 weeks   PLANNED INTERVENTIONS:  Therapeutic exercises, Therapeutic activity, Neuro Muscular re-education, Balance training, Gait training, Patient/Family education, Joint mobilization, Stair training, DME instructions, Dry Needling, Electrical stimulation,  Cryotherapy, Moist heat, Taping, Ultrasound, Ionotophoresis 90m/ml Dexamethasone, and Manual therapy.  All included unless contraindicated   PLAN FOR NEXT SESSION: Recheck results from MD visit.  Progressive AAROM/AROM improvements, isometrics  MScot Jun PT, DPT, OCS, ATC 01/12/22  2:25 PM

## 2022-01-14 ENCOUNTER — Ambulatory Visit (INDEPENDENT_AMBULATORY_CARE_PROVIDER_SITE_OTHER): Payer: BC Managed Care – PPO | Admitting: Rehabilitative and Restorative Service Providers"

## 2022-01-14 ENCOUNTER — Encounter: Payer: Self-pay | Admitting: Rehabilitative and Restorative Service Providers"

## 2022-01-14 DIAGNOSIS — G8929 Other chronic pain: Secondary | ICD-10-CM

## 2022-01-14 DIAGNOSIS — R600 Localized edema: Secondary | ICD-10-CM

## 2022-01-14 DIAGNOSIS — M25511 Pain in right shoulder: Secondary | ICD-10-CM | POA: Diagnosis not present

## 2022-01-14 DIAGNOSIS — R293 Abnormal posture: Secondary | ICD-10-CM | POA: Diagnosis not present

## 2022-01-14 DIAGNOSIS — M6281 Muscle weakness (generalized): Secondary | ICD-10-CM

## 2022-01-14 NOTE — Therapy (Addendum)
OUTPATIENT PHYSICAL THERAPY TREATMENT NOTE   Patient Name: MATAYA KILDUFF MRN: [id number] DOB:1960/08/15, 61 y.o., female Today's Date: 01/14/2022  END OF SESSION:   PT End of Session - 01/14/22 0840     Visit Number 10    Number of Visits 20    Date for PT Re-Evaluation 02/16/22    Authorization Type BCBS 30% coinsurance    PT Start Time 0843    PT Stop Time 0934    PT Time Calculation (min) 51 min    Activity Tolerance Patient tolerated treatment well    Behavior During Therapy WFL for tasks assessed/performed                 Past Medical History:  Diagnosis Date   Anemia    History of   Arthritis    Right Shoulder   History of TIA (transient ischemic attack)    Hypertension    Migraines    History of   PONV (postoperative nausea and vomiting)    Past Surgical History:  Procedure Laterality Date   ABDOMINAL HYSTERECTOMY     CESAREAN SECTION     COLONOSCOPY     NECK SURGERY     plate in neck   SHOULDER ARTHROSCOPY WITH OPEN ROTATOR CUFF REPAIR AND DISTAL CLAVICLE ACROMINECTOMY Right 11/24/2021   Procedure: RIGHT SHOULDER ARTHROSCOPIC SUBACROMIAL DECOMPRESSION WITH OPEN ROTATOR CUFF REPAIR AND DISTAL CLAVICLE ACROMINECTOMY;  Surgeon: [name], MD;  Location: [facility];  Service: Orthopedics;  Laterality: Right;   UPPER GI ENDOSCOPY     Patient Active Problem List   Diagnosis Date Noted   Complete tear of right rotator cuff 10/29/2021   Pain in right shoulder 09/24/2021   Anterior knee pain, right 10/09/2020   Overweight with body mass index (BMI) 25.0-29.9 10/20/2017   Endometriosis 10/20/2017   Hypertensive disorder 10/20/2017   Cyst of ovary 08/02/1996   Abnormal cervical Papanicolaou smear 08/03/1983    PCP: Seward Carol, MD   REFERRING PROVIDER: [name], MD   REFERRING DIAG: S46.011D (ICD-10-CM) - Traumatic complete tear of right rotator cuff, subsequent encounter  ONSET DATE: Surgery 11/24/2021  THERAPY DIAG:  Chronic  right shoulder pain  Muscle weakness (generalized)  Localized edema  Abnormal posture  PERTINENT HISTORY: 11/24/2021 surgery: SAD, open RTC repair and distal clavicle acrominectomy.  HTN, migraines, history of TIA, arthritis Rt shoulder  PRECAUTIONS: None at this time (progressed out of protocol limits)  SUBJECTIVE:  Pt indicated 4/10 upon arrival today.  Reported as achy sore.  Out of sling at this time.   PAIN:  NPRS scale: 4/10 Pain location: Rt shoulder Pain description: sore Aggravating factors: general soreness Relieving factors: OTC medicine   OBJECTIVE:    PATIENT SURVEYS:  01/12/2022: FOTO update 46  12/08/2021: FOTO eval: 43  predicted:   64   Patient Specific Functional Scale (0 unable to 10 no difficulty) 1) Hair/self care:             01/07/2022: 5/10 Eval 12/08/2021 : 0/10 2) Food prep 01/07/2022:      7/10         Eval 12/08/2021 :  3/10 3) Computer work             01/07/2022:  6/10 Eval 12/08/2021 : 6/10   COGNITION: 12/08/2021: Overall cognitive status: Within functional limits for tasks assessed  SENSATION: 12/08/2021: WFL   POSTURE: 12/08/2021:  Wearing sling   UPPER EXTREMITY ROM:    ROM Right PROM in supine 12/08/2021 Left 12/08/2021 Right PROM in supine 12/16/2021 Rt PROM  Supine 12/22/2021 Rt PROM Supine 12/30/2021 Rt AROM in supine 01/12/2022  Shoulder flexion 90, pain limited AROM in supine: 170 124, pain limited 130 deg c pain at end range 140 deg c pain end range 125 deg end range pain  Shoulder extension          Shoulder abduction 85, pain limited    90 deg c pain 105 deg c pain end range 90 AROM, 104 PROM end range pain  Shoulder adduction          Shoulder internal rotation Hand to belly c arm at side        Shoulder external rotation 0 deg c arm at side, pain limited 70 in 45 deg abduction AROM 20 deg c arm in 20 deg abduction, pain limited 18 deg arm in 20 deg abd c pain  40 deg in 45 deg abduction c end range  pain/tightness AROM 30, PROM 40 deg in 45 deg abduction c end range pain/tightness  Elbow flexion          Elbow extension          Wrist flexion          Wrist extension          Wrist ulnar deviation          Wrist radial deviation          Wrist pronation          Wrist supination          (Blank rows = not tested)   UPPER EXTREMITY MMT:   MMT Right 12/08/2021 Not tested due to surgery Left 12/08/2021 Right 01/05/2022  (Minimal resistance testing today)  Shoulder flexion     2/5  Shoulder extension       Shoulder abduction     1+  Shoulder adduction       Shoulder internal rotation     3+/5  Shoulder external rotation     1+  Middle trapezius       Lower trapezius       Elbow flexion       Elbow extension       Wrist flexion       Wrist extension       Wrist ulnar deviation       Wrist radial deviation       Wrist pronation       Wrist supination       Grip strength (lbs)       (Blank rows = not tested)   JOINT MOBILITY TESTING:  12/08/2021:Early to mid range Rt GH joint mobility testing WFL at this time, muscle guarding noted overall.    PALPATION:  12/08/2021: Tenderness to touch surrounding Rt shoulder joint in all sides               TODAY'S TREATMENT:  01/14/2022:             Therex:                                  Pulleys flexion, abduction 2 mins each way    Sidelying Rt shoulder ER 2 x 10 c towel at side   Sidelying AROM abduction 2  x 10    Supine AAROM c 1 lb bar into flexion x15    Supine AROM 2x 15   Supine 90 deg flexion circles 2x 20 cw, 2x 20 ccw 2 lb weight     Manual:  PROM all directions. Compression c Rt lat c activation, Rt infraspinatus c activation for mobility gains.  Contract/relax techniques for ER mobility gains (subscapularis , elevation gains (lats)  Vasopneumatic  Rt shoulder 34 deg low compression 10 mins   01/12/2022:             Therex:                                  Pulleys flexion, abduction 3 mins each way    Sidelying  AROM abduction    Supine AAROM c 2 lb bar into flexion 2 x 10   Supine AROM x10, x 15 bilateral   Supine 90 deg flexion circles 2x 20 cw, 2x 20 ccw 1 lb weight     Manual:  PROM all directions.  Mobilization c movement(active and passie) for ER c sustained posterior glide.  Inferior g3 mobilizations in flexion/scaption.      01/07/2022:             Therex:                                  Pulleys flexion, abduction 3 mins each way    Sidelying AROM abduction    Supine AAROM c 1 lb bar into flexion x 15   Supine AROM x 15 bilateral   Supine 90 deg flexion circles 2x 20 cw, 2x 20 ccw 1 lb weight   Standing tband rows green 2 x 15, gh ext 2 x 15                Vasopneumatic   Rt shoulder 34 deg low compression 10 mins   PATIENT EDUCATION: 01/14/2022: Education details: HEP progression updates Person educated: Patient Education method: Consulting civil engineer, Demonstration, Corporate treasurer cues, Verbal cues, and Handouts Education comprehension: verbalized understanding, returned demonstration, verbal cues required, and tactile cues required     HOME EXERCISE PROGRAM: Access Code: [email or web address] URL: [email or web address] Date: 01/14/2022 Prepared by: Scot Jun  Exercises - Seated Scapular Retraction  - 3-5 x daily - 7 x weekly - 1 sets - 10 reps - 3-5 hold - Supine Shoulder External Rotation in 45 Degrees Abduction AAROM with Dowel (Mirrored)  - 2-3 x daily - 7 x weekly - 1-2 sets - 10 reps - 5 hold - Supine Shoulder Flexion Extension AAROM with Dowel  - 2-3 x daily - 7 x weekly - 2-3 sets - 10 reps - 2-3 hold - Seated Shoulder Flexion AAROM with Pulley Behind (Mirrored)  - 2-3 x daily - 7 x weekly - 1 sets - 3 mins hold - Seated Shoulder Scaption AAROM with Pulley at Side (Mirrored)  - 2-3 x daily - 7 x weekly - 1 sets - 3 mins hold - Sidelying Shoulder External Rotation (Mirrored)  - 1-2 x daily - 7 x weekly - 2-3 sets - 10-15 reps - Sidelying Shoulder Abduction Palm Forward  - 1-2  x daily - 7 x weekly - 2-3 sets - 10-15 reps - Supine Shoulder Flexion Extension Full Range AROM  - 1-2 x daily - 7 x weekly - 2-3 sets -  10-15 reps  ASSESSMENT:   CLINICAL IMPRESSION: Presentation of active trigger points c pain concordant and association with muscle weakness/tightness noted in multiple muscles of Rt shoulder region, most noted in infraspinatus and lat.  Presence of trigger points impacts overall mobility and symptoms in conjunction with some end range hypomobility of capsular movement.  Positive improvement in elevation mobility noted immediately after use of compression techniques for trigger point release.  Continued skilled PT services indicated at this time.      OBJECTIVE IMPAIRMENTS decreased activity tolerance, decreased coordination, decreased endurance, decreased mobility, decreased ROM, decreased strength, hypomobility, increased edema, increased fascial restrictions, impaired perceived functional ability, increased muscle spasms, impaired flexibility, impaired UE functional use, improper body mechanics, postural dysfunction, and pain.    ACTIVITY LIMITATIONS cleaning, community activity, driving, meal prep, occupation, laundry, and yard work.    PERSONAL FACTORS  HTN, migraines, history of TIA, arthritis Rt shoulder  are also affecting patient's functional outcome.      REHAB POTENTIAL: Good   CLINICAL DECISION MAKING: Stable/uncomplicated   EVALUATION COMPLEXITY: Low     GOALS: Goals reviewed with patient? Yes Eval : 12/08/2021   Short term PT Goals (target date for Short term goals are 3 weeks 12/29/2021) Patient will demonstrate independent use of home exercise program to maintain progress from in clinic treatments. Goal status: MET 12/22/2021   Long term PT goals (target dates for all long term goals are 10 weeks  02/16/2022 )   1. Patient will demonstrate/report pain at worst less than or equal to 2/10 to facilitate minimal limitation in daily activity  secondary to pain symptoms. Goal status: on going - assessed 01/12/2022   2. Patient will demonstrate independent use of home exercise program to facilitate ability to maintain/progress functional gains from skilled physical therapy services. Goal status:  on going - assessed 01/12/2022   3. Patient will demonstrate FOTO outcome > or = 64 % to indicate reduced disability due to condition. Goal status:  on going - assessed 01/12/2022   4.  Patient will demonstrate Rt Soldier joint mobility WFL to facilitate usual self care, dressing, reaching overhead at PLOF s limitation due to symptoms.  Goal status: on going - assessed 01/12/2022   5.  Patient will demonstrate Rt UE MMT > or = 4/5 throughout to facilitate usual lifting, carrying in functional activity to PLOF s limitation.   Goal status:  on going - assessed 01/12/2022   6.  Patient will demonstrate/report patient specific functional scale reporting on average 8/10 or greater for return to PLOF.   Goal status:on going - assessed 01/12/2022   7.  Patient will demonstrate/report return to work, sleep s restriction due to symptoms.  a.  Goal Status:  on going - assessed 01/12/2022     PLAN: PT FREQUENCY: 2x/week   PT DURATION: 10 weeks   PLANNED INTERVENTIONS:  Therapeutic exercises, Therapeutic activity, Neuro Muscular re-education, Balance training, Gait training, Patient/Family education, Joint mobilization, Stair training, DME instructions, Dry Needling, Electrical stimulation, Cryotherapy, Moist heat, Taping, Ultrasound, Ionotophoresis 28m/ml Dexamethasone, and Manual therapy.  All included unless contraindicated   PLAN FOR NEXT SESSION: Trigger point release techniques/contract relax for mobility improvements from musculature.  Continued active mobility improvements.   MScot Jun PT, DPT, OCS, ATC 01/14/22  9:22 AM

## 2022-01-19 ENCOUNTER — Encounter: Payer: Self-pay | Admitting: Rehabilitative and Restorative Service Providers"

## 2022-01-19 ENCOUNTER — Ambulatory Visit (INDEPENDENT_AMBULATORY_CARE_PROVIDER_SITE_OTHER): Payer: BC Managed Care – PPO | Admitting: Rehabilitative and Restorative Service Providers"

## 2022-01-19 DIAGNOSIS — R600 Localized edema: Secondary | ICD-10-CM | POA: Diagnosis not present

## 2022-01-19 DIAGNOSIS — M6281 Muscle weakness (generalized): Secondary | ICD-10-CM | POA: Diagnosis not present

## 2022-01-19 DIAGNOSIS — G8929 Other chronic pain: Secondary | ICD-10-CM

## 2022-01-19 DIAGNOSIS — M25511 Pain in right shoulder: Secondary | ICD-10-CM | POA: Diagnosis not present

## 2022-01-19 DIAGNOSIS — R293 Abnormal posture: Secondary | ICD-10-CM | POA: Diagnosis not present

## 2022-01-19 NOTE — Therapy (Signed)
OUTPATIENT PHYSICAL THERAPY TREATMENT NOTE   Patient Name: Tammy Aguirre MRN: [id number] DOB:January 19, 1961, 61 y.o., female Today's Date: 01/19/2022  END OF SESSION:   PT End of Session - 01/19/22 0849     Visit Number 11    Number of Visits 20    Date for PT Re-Evaluation 02/16/22    Authorization Type [employer] 30% coinsurance    PT Start Time 0845    PT Stop Time 0925    PT Time Calculation (min) 40 min    Activity Tolerance Patient tolerated treatment well    Behavior During Therapy WFL for tasks assessed/performed                  Past Medical History:  Diagnosis Date   Anemia    History of   Arthritis    Right Shoulder   History of TIA (transient ischemic attack)    Hypertension    Migraines    History of   PONV (postoperative nausea and vomiting)    Past Surgical History:  Procedure Laterality Date   ABDOMINAL HYSTERECTOMY     CESAREAN SECTION     COLONOSCOPY     NECK SURGERY     plate in neck   SHOULDER ARTHROSCOPY WITH OPEN ROTATOR CUFF REPAIR AND DISTAL CLAVICLE ACROMINECTOMY Right 11/24/2021   Procedure: RIGHT SHOULDER ARTHROSCOPIC SUBACROMIAL DECOMPRESSION WITH OPEN ROTATOR CUFF REPAIR AND DISTAL CLAVICLE ACROMINECTOMY;  Surgeon: [name], MD;  Location: [facility];  Service: Orthopedics;  Laterality: Right;   UPPER GI ENDOSCOPY     Patient Active Problem List   Diagnosis Date Noted   Complete tear of right rotator cuff 10/29/2021   Pain in right shoulder 09/24/2021   Anterior knee pain, right 10/09/2020   Overweight with body mass index (BMI) 25.0-29.9 10/20/2017   Endometriosis 10/20/2017   Hypertensive disorder 10/20/2017   Cyst of ovary 08/02/1996   Abnormal cervical Papanicolaou smear 08/03/1983    PCP: Seward Carol, MD   REFERRING PROVIDER: [name], MD   REFERRING DIAG: S46.011D (ICD-10-CM) - Traumatic complete tear of right rotator cuff, subsequent encounter  ONSET DATE: Surgery 11/24/2021  THERAPY DIAG:   Chronic right shoulder pain  Muscle weakness (generalized)  Localized edema  Abnormal posture  PERTINENT HISTORY: 11/24/2021 surgery: SAD, open RTC repair and distal clavicle acrominectomy.  HTN, migraines, history of TIA, arthritis Rt shoulder  PRECAUTIONS: None at this time (progressed out of protocol limits)  SUBJECTIVE:  Pt indicated feeling pop in shower and was unsure if it was good or not.  Had ache at night.  Mentioned some improvement in movement of arm since last visit.   PAIN:  NPRS scale: 2/10 Pain location: Rt shoulder Pain description: sore, stinging Aggravating factors: general soreness Relieving factors: OTC medicine   OBJECTIVE:    PATIENT SURVEYS:  01/12/2022: FOTO update 46  12/08/2021: FOTO eval: 43  predicted:   64   Patient Specific Functional Scale (0 unable to 10 no difficulty) 1) Hair/self care:             01/07/2022: 5/10 Eval 12/08/2021 : 0/10 2) Food prep 01/07/2022:      7/10         Eval 12/08/2021 :  3/10 3) Computer work             01/07/2022:  6/10 Eval 12/08/2021 : 6/10   COGNITION: 12/08/2021: Overall cognitive status: Within functional limits for tasks assessed  SENSATION: 12/08/2021: WFL   POSTURE: 12/08/2021:  Wearing sling   UPPER EXTREMITY ROM:    ROM Right PROM in supine 12/08/2021 Left 12/08/2021 Right PROM in supine 12/16/2021 Rt PROM  Supine 12/22/2021 Rt PROM Supine 12/30/2021 Rt AROM in supine 01/12/2022  Shoulder flexion 90, pain limited AROM in supine: 170 124, pain limited 130 deg c pain at end range 140 deg c pain end range 125 deg end range pain  Shoulder extension          Shoulder abduction 85, pain limited    90 deg c pain 105 deg c pain end range 90 AROM, 104 PROM end range pain  Shoulder adduction          Shoulder internal rotation Hand to belly c arm at side        Shoulder external rotation 0 deg c arm at side, pain limited 70 in 45 deg abduction AROM 20 deg c arm in 20 deg  abduction, pain limited 18 deg arm in 20 deg abd c pain  40 deg in 45 deg abduction c end range pain/tightness AROM 30, PROM 40 deg in 45 deg abduction c end range pain/tightness  Elbow flexion          Elbow extension          Wrist flexion          Wrist extension          Wrist ulnar deviation          Wrist radial deviation          Wrist pronation          Wrist supination          (Blank rows = not tested)   UPPER EXTREMITY MMT:   MMT Right 12/08/2021 Not tested due to surgery Right 01/05/2022  (Minimal resistance testing today) Right 01/19/2022  Shoulder flexion   2/5   Shoulder extension      Shoulder abduction   1+   Shoulder adduction      Shoulder internal rotation   3+/5   Shoulder external rotation   1+   Middle trapezius      Lower trapezius      Elbow flexion      Elbow extension      Wrist flexion      Wrist extension      Wrist ulnar deviation      Wrist radial deviation      Wrist pronation      Wrist supination      Grip strength (lbs)      (Blank rows = not tested)   JOINT MOBILITY TESTING:  12/08/2021:Early to mid range Rt GH joint mobility testing WFL at this time, muscle guarding noted overall.    PALPATION:  12/08/2021: Tenderness to touch surrounding Rt shoulder joint in all sides               TODAY'S TREATMENT:  01/19/2022:             Therex:                                  Pulleys flexion, abduction 2 mins each way    Sidelying Rt shoulder ER 2 x 10 c towel at side 1 lb    Sidelying AROM abduction 2 x 10 1 lb   Supine horizontal abduction red band 2 x 10  Supine AROM in 30 deg bed elevation to fatigue x 20, x 20   Supine 90 deg flexion circles 2x 30 cw, 2x 30 ccw 2 lb weight   Tband rows, gh ext green x 20 each     Manual:  Compression c Rt lat c activation c stretch into flexion/scaption after.  Contract/relax techniques for ER mobility gains (subscapularis , elevation gains (lats), mobilization c movement c posterior Rt shoulder glide  c passive ER mobility.    01/14/2022:             Therex:                                  Pulleys flexion, abduction 2 mins each way    Sidelying Rt shoulder ER 2 x 10 c towel at side   Sidelying AROM abduction 2 x 10    Supine AAROM c 1 lb bar into flexion x15    Supine AROM 2x 15   Supine 90 deg flexion circles 2x 20 cw, 2x 20 ccw 2 lb weight     Manual:  PROM all directions. Compression c Rt lat c activation, Rt infraspinatus c activation for mobility gains.  Contract/relax techniques for ER mobility gains (subscapularis , elevation gains (lats)  Vasopneumatic  Rt shoulder 34 deg low compression 10 mins   01/12/2022:             Therex:                                  Pulleys flexion, abduction 3 mins each way    Sidelying AROM abduction    Supine AAROM c 2 lb bar into flexion 2 x 10   Supine AROM x10, x 15 bilateral   Supine 90 deg flexion circles 2x 20 cw, 2x 20 ccw 1 lb weight     Manual:  PROM all directions.  Mobilization c movement(active and passie) for ER c sustained posterior glide.  Inferior g3 mobilizations in flexion/scaption.      PATIENT EDUCATION: 01/14/2022: Education details: HEP progression updates Person educated: Patient Education method: Explanation, Demonstration, Tactile cues, Verbal cues, and Handouts Education comprehension: verbalized understanding, returned demonstration, verbal cues required, and tactile cues required     HOME EXERCISE PROGRAM: Access Code: [email or web address] URL: [email or web address] Date: 01/14/2022 Prepared by: Scot Jun  Exercises - Seated Scapular Retraction  - 3-5 x daily - 7 x weekly - 1 sets - 10 reps - 3-5 hold - Supine Shoulder External Rotation in 45 Degrees Abduction AAROM with Dowel (Mirrored)  - 2-3 x daily - 7 x weekly - 1-2 sets - 10 reps - 5 hold - Supine Shoulder Flexion Extension AAROM with Dowel  - 2-3 x daily - 7 x weekly - 2-3 sets - 10 reps - 2-3 hold - Seated Shoulder Flexion AAROM with  Pulley Behind (Mirrored)  - 2-3 x daily - 7 x weekly - 1 sets - 3 mins hold - Seated Shoulder Scaption AAROM with Pulley at Side (Mirrored)  - 2-3 x daily - 7 x weekly - 1 sets - 3 mins hold - Sidelying Shoulder External Rotation (Mirrored)  - 1-2 x daily - 7 x weekly - 2-3 sets - 10-15 reps - Sidelying Shoulder Abduction Palm Forward  - 1-2 x daily - 7 x weekly - 2-3 sets - 10-15 reps -  Supine Shoulder Flexion Extension Full Range AROM  - 1-2 x daily - 7 x weekly - 2-3 sets - 10-15 reps  ASSESSMENT:   CLINICAL IMPRESSION: Guarding still present c ER mobility but improving.  Elevation passive mobility improved greatly c use of lat release techniques.  Continued progression in AROM/strengthening to improve functional lifting/reaching.  Continued skilled PT services indicated at this time.      OBJECTIVE IMPAIRMENTS decreased activity tolerance, decreased coordination, decreased endurance, decreased mobility, decreased ROM, decreased strength, hypomobility, increased edema, increased fascial restrictions, impaired perceived functional ability, increased muscle spasms, impaired flexibility, impaired UE functional use, improper body mechanics, postural dysfunction, and pain.    ACTIVITY LIMITATIONS cleaning, community activity, driving, meal prep, occupation, laundry, and yard work.    PERSONAL FACTORS  HTN, migraines, history of TIA, arthritis Rt shoulder  are also affecting patient's functional outcome.      REHAB POTENTIAL: Good   CLINICAL DECISION MAKING: Stable/uncomplicated   EVALUATION COMPLEXITY: Low     GOALS: Goals reviewed with patient? Yes Eval : 12/08/2021   Short term PT Goals (target date for Short term goals are 3 weeks 12/29/2021) Patient will demonstrate independent use of home exercise program to maintain progress from in clinic treatments. Goal status: MET 12/22/2021   Long term PT goals (target dates for all long term goals are 10 weeks  02/16/2022 )   1. Patient will  demonstrate/report pain at worst less than or equal to 2/10 to facilitate minimal limitation in daily activity secondary to pain symptoms. Goal status: on going - assessed 01/12/2022   2. Patient will demonstrate independent use of home exercise program to facilitate ability to maintain/progress functional gains from skilled physical therapy services. Goal status:  on going - assessed 01/12/2022   3. Patient will demonstrate FOTO outcome > or = 64 % to indicate reduced disability due to condition. Goal status:  on going - assessed 01/12/2022   4.  Patient will demonstrate Rt [place] joint mobility WFL to facilitate usual self care, dressing, reaching overhead at PLOF s limitation due to symptoms.  Goal status: on going - assessed 01/12/2022   5.  Patient will demonstrate Rt UE MMT > or = 4/5 throughout to facilitate usual lifting, carrying in functional activity to PLOF s limitation.   Goal status:  on going - assessed 01/12/2022   6.  Patient will demonstrate/report patient specific functional scale reporting on average 8/10 or greater for return to PLOF.   Goal status:on going - assessed 01/12/2022   7.  Patient will demonstrate/report return to work, sleep s restriction due to symptoms.  a.  Goal Status:  on going - assessed 01/12/2022     PLAN: PT FREQUENCY: 2x/week   PT DURATION: 10 weeks   PLANNED INTERVENTIONS:  Therapeutic exercises, Therapeutic activity, Neuro Muscular re-education, Balance training, Gait training, Patient/Family education, Joint mobilization, Stair training, DME instructions, Dry Needling, Electrical stimulation, Cryotherapy, Moist heat, Taping, Ultrasound, Ionotophoresis 29m/ml Dexamethasone, and Manual therapy.  All included unless contraindicated   PLAN FOR NEXT SESSION: Continued transitioning of AROM and early strengthening as tolerated.  Add some band exercises (rows, gh ext, ER to HEP ?)  MScot Jun PT, DPT, OCS, ATC 01/19/22  9:21 AM

## 2022-01-21 ENCOUNTER — Ambulatory Visit (INDEPENDENT_AMBULATORY_CARE_PROVIDER_SITE_OTHER): Payer: BC Managed Care – PPO | Admitting: Rehabilitative and Restorative Service Providers"

## 2022-01-21 ENCOUNTER — Encounter: Payer: Self-pay | Admitting: Rehabilitative and Restorative Service Providers"

## 2022-01-21 DIAGNOSIS — M25511 Pain in right shoulder: Secondary | ICD-10-CM

## 2022-01-21 DIAGNOSIS — Z13220 Encounter for screening for lipoid disorders: Secondary | ICD-10-CM | POA: Diagnosis not present

## 2022-01-21 DIAGNOSIS — Z23 Encounter for immunization: Secondary | ICD-10-CM | POA: Diagnosis not present

## 2022-01-21 DIAGNOSIS — I10 Essential (primary) hypertension: Secondary | ICD-10-CM | POA: Diagnosis not present

## 2022-01-21 DIAGNOSIS — Z0000 Encounter for general adult medical examination without abnormal findings: Secondary | ICD-10-CM | POA: Diagnosis not present

## 2022-01-21 DIAGNOSIS — R600 Localized edema: Secondary | ICD-10-CM | POA: Diagnosis not present

## 2022-01-21 DIAGNOSIS — M6281 Muscle weakness (generalized): Secondary | ICD-10-CM | POA: Diagnosis not present

## 2022-01-21 DIAGNOSIS — G8929 Other chronic pain: Secondary | ICD-10-CM

## 2022-01-21 DIAGNOSIS — R293 Abnormal posture: Secondary | ICD-10-CM | POA: Diagnosis not present

## 2022-01-21 NOTE — Therapy (Signed)
OUTPATIENT PHYSICAL THERAPY TREATMENT NOTE   Patient Name: Tammy Aguirre MRN: [id number] DOB:1960/08/17, 61 y.o., female Today's Date: 01/21/2022  END OF SESSION:   PT End of Session - 01/21/22 0845     Visit Number 12    Number of Visits 20    Date for PT Re-Evaluation 02/16/22    Authorization Type [employer] 30% coinsurance    PT Start Time 0841    PT Stop Time 0921    PT Time Calculation (min) 40 min    Activity Tolerance Patient tolerated treatment well    Behavior During Therapy WFL for tasks assessed/performed                   Past Medical History:  Diagnosis Date   Anemia    History of   Arthritis    Right Shoulder   History of TIA (transient ischemic attack)    Hypertension    Migraines    History of   PONV (postoperative nausea and vomiting)    Past Surgical History:  Procedure Laterality Date   ABDOMINAL HYSTERECTOMY     CESAREAN SECTION     COLONOSCOPY     NECK SURGERY     plate in neck   SHOULDER ARTHROSCOPY WITH OPEN ROTATOR CUFF REPAIR AND DISTAL CLAVICLE ACROMINECTOMY Right 11/24/2021   Procedure: RIGHT SHOULDER ARTHROSCOPIC SUBACROMIAL DECOMPRESSION WITH OPEN ROTATOR CUFF REPAIR AND DISTAL CLAVICLE ACROMINECTOMY;  Surgeon: [name], MD;  Location: [facility];  Service: Orthopedics;  Laterality: Right;   UPPER GI ENDOSCOPY     Patient Active Problem List   Diagnosis Date Noted   Complete tear of right rotator cuff 10/29/2021   Pain in right shoulder 09/24/2021   Anterior knee pain, right 10/09/2020   Overweight with body mass index (BMI) 25.0-29.9 10/20/2017   Endometriosis 10/20/2017   Hypertensive disorder 10/20/2017   Cyst of ovary 08/02/1996   Abnormal cervical Papanicolaou smear 08/03/1983    PCP: Seward Carol, MD   REFERRING PROVIDER: [name], MD   REFERRING DIAG: S46.011D (ICD-10-CM) - Traumatic complete tear of right rotator cuff, subsequent encounter  ONSET DATE: Surgery 11/24/2021  THERAPY DIAG:   Chronic right shoulder pain  Muscle weakness (generalized)  Localized edema  Abnormal posture  PERTINENT HISTORY: 11/24/2021 surgery: SAD, open RTC repair and distal clavicle acrominectomy.  HTN, migraines, history of TIA, arthritis Rt shoulder  PRECAUTIONS: None at this time (progressed out of protocol limits)  SUBJECTIVE:  Pt reported feeling pretty good today, no specific pain noted.  Felt ok after last visit and started using light weight at home.   PAIN:  NPRS scale: 0/10 Pain location: Rt shoulder Pain description:  Aggravating factors:  Relieving factors:    OBJECTIVE:    PATIENT SURVEYS:  01/12/2022: FOTO update 46  12/08/2021: FOTO eval: 43  predicted:   64   Patient Specific Functional Scale (0 unable to 10 no difficulty) 1) Hair/self care:             01/07/2022: 5/10 Eval 12/08/2021 : 0/10 2) Food prep 01/07/2022:      7/10         Eval 12/08/2021 :  3/10 3) Computer work             01/07/2022:  6/10 Eval 12/08/2021 : 6/10   COGNITION: 12/08/2021: Overall cognitive status: Within functional limits for tasks assessed  SENSATION: 12/08/2021: WFL   POSTURE: 12/08/2021:  Wearing sling   UPPER EXTREMITY ROM:    ROM Right PROM in supine 12/08/2021 Left 12/08/2021 Right PROM in supine 12/16/2021 Rt PROM  Supine 12/22/2021 Rt PROM Supine 12/30/2021 Rt AROM in supine 01/12/2022 Rt  01/21/2022  Shoulder flexion 90, pain limited AROM in supine: 170 124, pain limited 130 deg c pain at end range 140 deg c pain end range 125 deg end range pain   Shoulder extension           Shoulder abduction 85, pain limited    90 deg c pain 105 deg c pain end range 90 AROM, 104 PROM end range pain   Shoulder adduction           Shoulder internal rotation Hand to belly c arm at side         Shoulder external rotation 0 deg c arm at side, pain limited 70 in 45 deg abduction AROM 20 deg c arm in 20 deg abduction, pain limited 18 deg arm in 20 deg abd c pain   40 deg in 45 deg abduction c end range pain/tightness AROM 30, PROM 40 deg in 45 deg abduction c end range pain/tightness PROM 60 deg in 45 deg abduction  Elbow flexion           Elbow extension           Wrist flexion           Wrist extension           Wrist ulnar deviation           Wrist radial deviation           Wrist pronation           Wrist supination           (Blank rows = not tested)   UPPER EXTREMITY MMT:   MMT Right 12/08/2021 Not tested due to surgery Right 01/05/2022  (Minimal resistance testing today) Right 01/21/2022  Shoulder flexion   2/5 2+/5   Shoulder extension      Shoulder abduction   1+   Shoulder adduction      Shoulder internal rotation   3+/5   Shoulder external rotation   1+   Middle trapezius      Lower trapezius      Elbow flexion      Elbow extension      Wrist flexion      Wrist extension      Wrist ulnar deviation      Wrist radial deviation      Wrist pronation      Wrist supination      Grip strength (lbs)      (Blank rows = not tested)   JOINT MOBILITY TESTING:  12/08/2021:Early to mid range Rt GH joint mobility testing WFL at this time, muscle guarding noted overall.    PALPATION:  12/08/2021: Tenderness to touch surrounding Rt shoulder joint in all sides               TODAY'S TREATMENT:  01/21/2022:             Therex:                                  UBE fwd/back 4 mins each way lvl 2.0 c 15 second interval at end of each minute.  UE ranger AAROM flexion 2 x 10, scaption x 10    Standing wand 1 lb flexion to shrug onset (80 degrees range) and abduction AAROM c focus on eccentric lowering control from Rt shoulder x 10 each way    Sidelying Rt shoulder ER 2 x 15 c towel at side 2 lb    Sidelying AROM abduction 2 x 15 1 lb      Tband rows, gh ext green 2 x 15 each     Manual:  Supine Rt arm passive ER stretch in 45 deg abduction longer duration several times.  Mobilization c movement c posterior Rt GH glide c passive movement.    01/19/2022:             Therex:                                  Pulleys flexion, abduction 2 mins each way    Sidelying Rt shoulder ER 2 x 10 c towel at side 1 lb    Sidelying AROM abduction 2 x 10 1 lb   Supine horizontal abduction red band 2 x 10     Supine AROM in 30 deg bed elevation to fatigue x 20, x 20   Supine 90 deg flexion circles 2x 30 cw, 2x 30 ccw 2 lb weight   Tband rows, gh ext green x 20 each     Manual:  Compression c Rt lat c activation c stretch into flexion/scaption after.  Contract/relax techniques for ER mobility gains (subscapularis , elevation gains (lats), mobilization c movement c posterior Rt shoulder glide c passive ER mobility.    01/14/2022:             Therex:                                  Pulleys flexion, abduction 2 mins each way    Sidelying Rt shoulder ER 2 x 10 c towel at side   Sidelying AROM abduction 2 x 10    Supine AAROM c 1 lb bar into flexion x15    Supine AROM 2x 15   Supine 90 deg flexion circles 2x 20 cw, 2x 20 ccw 2 lb weight     Manual:  PROM all directions. Compression c Rt lat c activation, Rt infraspinatus c activation for mobility gains.  Contract/relax techniques for ER mobility gains (subscapularis , elevation gains (lats)  Vasopneumatic  Rt shoulder 34 deg low compression 10 mins   PATIENT EDUCATION: 01/14/2022: Education details: HEP progression updates Person educated: Patient Education method: Explanation, Demonstration, Tactile cues, Verbal cues, and Handouts Education comprehension: verbalized understanding, returned demonstration, verbal cues required, and tactile cues required     HOME EXERCISE PROGRAM: Access Code: [email or web address] URL: [email or web address] Date: 01/21/2022 Prepared by: Scot Jun  Exercises - Seated Scapular Retraction  - 3-5 x daily - 7 x weekly - 1 sets - 10 reps - 3-5 hold - Supine Shoulder External Rotation in 45 Degrees Abduction AAROM with Dowel (Mirrored)  - 2-3 x  daily - 7 x weekly - 1-2 sets - 10 reps - 5 hold - Sidelying Shoulder External Rotation (Mirrored)  - 1-2 x daily - 7 x weekly - 2-3 sets - 10-15 reps - Sidelying Shoulder Abduction Palm Forward  - 1-2 x daily - 7 x weekly - 2-3 sets -  10-15 reps - Standing Shoulder Flexion AAROM with Dowel  - 1-2 x daily - 7 x weekly - 1-2 sets - 10 reps - Standing Shoulder Abduction AAROM with Dowel (Mirrored)  - 1-2 x daily - 7 x weekly - 1-2 sets - 10 reps - Standing Shoulder Row with Anchored Resistance  - 2 x daily - 7 x weekly - 1-2 sets - 10-15 reps - Shoulder Extension with Resistance  - 2 x daily - 7 x weekly - 1-2 sets - 10-15 reps  ASSESSMENT:   CLINICAL IMPRESSION: Progressed to include eccentric lowering against gravity for strengthening in functional lifting positioning but due to shrug, AAROM in concentric portion of movement required to avoid compensatory movements.  Best ROM for ER noted today.      OBJECTIVE IMPAIRMENTS decreased activity tolerance, decreased coordination, decreased endurance, decreased mobility, decreased ROM, decreased strength, hypomobility, increased edema, increased fascial restrictions, impaired perceived functional ability, increased muscle spasms, impaired flexibility, impaired UE functional use, improper body mechanics, postural dysfunction, and pain.    ACTIVITY LIMITATIONS cleaning, community activity, driving, meal prep, occupation, laundry, and yard work.    PERSONAL FACTORS  HTN, migraines, history of TIA, arthritis Rt shoulder  are also affecting patient's functional outcome.      REHAB POTENTIAL: Good   CLINICAL DECISION MAKING: Stable/uncomplicated   EVALUATION COMPLEXITY: Low     GOALS: Goals reviewed with patient? Yes Eval : 12/08/2021   Short term PT Goals (target date for Short term goals are 3 weeks 12/29/2021) Patient will demonstrate independent use of home exercise program to maintain progress from in clinic treatments. Goal status: MET  12/22/2021   Long term PT goals (target dates for all long term goals are 10 weeks  02/16/2022 )   1. Patient will demonstrate/report pain at worst less than or equal to 2/10 to facilitate minimal limitation in daily activity secondary to pain symptoms. Goal status: on going - assessed 01/12/2022   2. Patient will demonstrate independent use of home exercise program to facilitate ability to maintain/progress functional gains from skilled physical therapy services. Goal status:  on going - assessed 01/12/2022   3. Patient will demonstrate FOTO outcome > or = 64 % to indicate reduced disability due to condition. Goal status:  on going - assessed 01/12/2022   4.  Patient will demonstrate Rt [place] joint mobility WFL to facilitate usual self care, dressing, reaching overhead at PLOF s limitation due to symptoms.  Goal status: on going - assessed 01/12/2022   5.  Patient will demonstrate Rt UE MMT > or = 4/5 throughout to facilitate usual lifting, carrying in functional activity to PLOF s limitation.   Goal status:  on going - assessed 01/12/2022   6.  Patient will demonstrate/report patient specific functional scale reporting on average 8/10 or greater for return to PLOF.   Goal status:on going - assessed 01/12/2022   7.  Patient will demonstrate/report return to work, sleep s restriction due to symptoms.  a.  Goal Status:  on going - assessed 01/12/2022     PLAN: PT FREQUENCY: 2x/week   PT DURATION: 10 weeks   PLANNED INTERVENTIONS:  Therapeutic exercises, Therapeutic activity, Neuro Muscular re-education, Balance training, Gait training, Patient/Family education, Joint mobilization, Stair training, DME instructions, Dry Needling, Electrical stimulation, Cryotherapy, Moist heat, Taping, Ultrasound, Ionotophoresis 61m/ml Dexamethasone, and Manual therapy.  All included unless contraindicated   PLAN FOR NEXT SESSION: Avoid shrug in elevation against gravity.  Progressive strengthening c focus on  higher  reps versus heavy weights.   Scot Jun, PT, DPT, OCS, ATC 01/21/22  9:20 AM

## 2022-01-26 ENCOUNTER — Ambulatory Visit: Payer: BC Managed Care – PPO | Admitting: Physical Therapy

## 2022-01-26 ENCOUNTER — Encounter: Payer: BC Managed Care – PPO | Admitting: Rehabilitative and Restorative Service Providers"

## 2022-01-26 ENCOUNTER — Encounter: Payer: Self-pay | Admitting: Orthopaedic Surgery

## 2022-01-26 ENCOUNTER — Encounter: Payer: Self-pay | Admitting: Physical Therapy

## 2022-01-26 ENCOUNTER — Ambulatory Visit (INDEPENDENT_AMBULATORY_CARE_PROVIDER_SITE_OTHER): Payer: BC Managed Care – PPO | Admitting: Orthopaedic Surgery

## 2022-01-26 DIAGNOSIS — M25511 Pain in right shoulder: Secondary | ICD-10-CM | POA: Diagnosis not present

## 2022-01-26 DIAGNOSIS — G8929 Other chronic pain: Secondary | ICD-10-CM

## 2022-01-26 DIAGNOSIS — R600 Localized edema: Secondary | ICD-10-CM

## 2022-01-26 DIAGNOSIS — M6281 Muscle weakness (generalized): Secondary | ICD-10-CM

## 2022-01-26 DIAGNOSIS — R293 Abnormal posture: Secondary | ICD-10-CM

## 2022-01-26 NOTE — Therapy (Signed)
OUTPATIENT PHYSICAL THERAPY TREATMENT NOTE   Patient Name: Tammy Aguirre MRN: [id number] DOB:29-Apr-1961, 61 y.o., female Today's Date: 01/26/2022  END OF SESSION:   PT End of Session - 01/26/22 1320     Visit Number 13    Number of Visits 20    Date for PT Re-Evaluation 02/16/22    Authorization Type [employer] 30% coinsurance    PT Start Time 1304    PT Stop Time 1343    PT Time Calculation (min) 39 min    Activity Tolerance Patient tolerated treatment well    Behavior During Therapy WFL for tasks assessed/performed                    Past Medical History:  Diagnosis Date   Anemia    History of   Arthritis    Right Shoulder   History of TIA (transient ischemic attack)    Hypertension    Migraines    History of   PONV (postoperative nausea and vomiting)    Past Surgical History:  Procedure Laterality Date   ABDOMINAL HYSTERECTOMY     CESAREAN SECTION     COLONOSCOPY     NECK SURGERY     plate in neck   SHOULDER ARTHROSCOPY WITH OPEN ROTATOR CUFF REPAIR AND DISTAL CLAVICLE ACROMINECTOMY Right 11/24/2021   Procedure: RIGHT SHOULDER ARTHROSCOPIC SUBACROMIAL DECOMPRESSION WITH OPEN ROTATOR CUFF REPAIR AND DISTAL CLAVICLE ACROMINECTOMY;  Surgeon: Valeria Batman, MD;  Location: [facility];  Service: Orthopedics;  Laterality: Right;   UPPER GI ENDOSCOPY     Patient Active Problem List   Diagnosis Date Noted   Complete tear of right rotator cuff 10/29/2021   Pain in right shoulder 09/24/2021   Anterior knee pain, right 10/09/2020   Overweight with body mass index (BMI) 25.0-29.9 10/20/2017   Endometriosis 10/20/2017   Hypertensive disorder 10/20/2017   Cyst of ovary 08/02/1996   Abnormal cervical Papanicolaou smear 08/03/1983    PCP: Renford Dills, MD   REFERRING PROVIDER: Valeria Batman, MD   REFERRING DIAG: S46.011D (ICD-10-CM) - Traumatic complete tear of right rotator cuff, subsequent encounter  ONSET DATE: Surgery 11/24/2021  THERAPY DIAG:   Chronic right shoulder pain  Muscle weakness (generalized)  Localized edema  Abnormal posture  PERTINENT HISTORY: 11/24/2021 surgery: SAD, open RTC repair and distal clavicle acrominectomy.  HTN, migraines, history of TIA, arthritis Rt shoulder  PRECAUTIONS: None at this time (progressed out of protocol limits)  SUBJECTIVE:  Pt reporting more stiffness today.   PAIN:  NPRS scale: 2/10 Pain location: Rt shoulder Pain description:  Aggravating factors:  Relieving factors:    OBJECTIVE:    PATIENT SURVEYS:  01/12/2022: FOTO update 46  12/08/2021: FOTO eval: 43  predicted:   64   Patient Specific Functional Scale (0 unable to 10 no difficulty) 1) Hair/self care:             01/07/2022: 5/10 Eval 12/08/2021 : 0/10 2) Food prep 01/07/2022:      7/10         Eval 12/08/2021 :  3/10 3) Computer work             01/07/2022:  6/10 Eval 12/08/2021 : 6/10   COGNITION: 12/08/2021: Overall cognitive status: Within functional limits for tasks assessed  SENSATION: 12/08/2021: WFL   POSTURE: 12/08/2021:  Wearing sling   UPPER EXTREMITY ROM:    ROM Right PROM in supine 12/08/2021 Left 12/08/2021 Right PROM in supine 12/16/2021 Rt PROM  Supine 12/22/2021 Rt PROM Supine 12/30/2021 Rt AROM in supine 01/12/2022 Rt  01/21/2022 Rt 01/26/22  Shoulder flexion 90, pain limited AROM in supine: 170 124, pain limited 130 deg c pain at end range 140 deg c pain end range 125 deg end range pain  130  Shoulder extension            Shoulder abduction 85, pain limited    90 deg c pain 105 deg c pain end range 90 AROM, 104 PROM end range pain    Shoulder adduction            Shoulder internal rotation Hand to belly c arm at side          Shoulder external rotation 0 deg c arm at side, pain limited 70 in 45 deg abduction AROM 20 deg c arm in 20 deg abduction, pain limited 18 deg arm in 20 deg abd c pain  40 deg in 45 deg abduction c end range pain/tightness AROM 30, PROM 40 deg  in 45 deg abduction c end range pain/tightness PROM 60 deg in 45 deg abduction AROM 40 PROM 60 Shoulder abd 45 deg  Elbow flexion            Elbow extension            Wrist flexion            Wrist extension            Wrist ulnar deviation            Wrist radial deviation            Wrist pronation            Wrist supination            (Blank rows = not tested)   UPPER EXTREMITY MMT:   MMT Right 12/08/2021 Not tested due to surgery Right 01/05/2022  (Minimal resistance testing today) Right 01/21/2022  Shoulder flexion   2/5 2+/5   Shoulder extension      Shoulder abduction   1+   Shoulder adduction      Shoulder internal rotation   3+/5   Shoulder external rotation   1+   Middle trapezius      Lower trapezius      Elbow flexion      Elbow extension      Wrist flexion      Wrist extension      Wrist ulnar deviation      Wrist radial deviation      Wrist pronation      Wrist supination      Grip strength (lbs)      (Blank rows = not tested)   JOINT MOBILITY TESTING:  12/08/2021:Early to mid range Rt GH joint mobility testing WFL at this time, muscle guarding noted overall.    PALPATION:  12/08/2021: Tenderness to touch surrounding Rt shoulder joint in all sides               TODAY'S TREATMENT:  01/26/2022: Therex:  Pulleys: flexion and scaption x 2 minutes each         UBE fwd/back 4 mins each way lvl 2.0  Rolling red physioball up wall x 10 holding 5 seconds at end range Standing wand 2#  flexion to shoulder hike AAROM abduction with 2 # bar Sidelying Rt shoulder ER 2 x 15 c towel at side 2 lb  Sidelying AROM abduction 2 x 15 1 lb Standing Level 3 band Rows 2 x 15 each  Door stretch: x 2 holding 20 sec  Manual: PROM c mobilization c Rt posterior GH glide     01/21/2022:             Therex:                                  UBE fwd/back 4 mins each way lvl 2.0 c 15 second interval at end of each minute.    UE ranger AAROM flexion 2 x 10, scaption x 10     Standing wand 1 lb flexion to shrug onset (80 degrees range) and abduction AAROM c focus on eccentric lowering control from Rt shoulder x 10 each way    Sidelying Rt shoulder ER 2 x 15 c towel at side 2 lb    Sidelying AROM abduction 2 x 15 1 lb      Tband rows, gh ext green 2 x 15 each     Manual:  Supine Rt arm passive ER stretch in 45 deg abduction longer duration several times.  Mobilization c movement c posterior Rt GH glide c passive movement.   01/19/2022:             Therex:                                  Pulleys flexion, abduction 2 mins each way    Sidelying Rt shoulder ER 2 x 10 c towel at side 1 lb    Sidelying AROM abduction 2 x 10 1 lb   Supine horizontal abduction red band 2 x 10     Supine AROM in 30 deg bed elevation to fatigue x 20, x 20   Supine 90 deg flexion circles 2x 30 cw, 2x 30 ccw 2 lb weight   Tband rows, gh ext green x 20 each     Manual:  Compression c Rt lat c activation c stretch into flexion/scaption after.  Contract/relax techniques for ER mobility gains (subscapularis , elevation gains (lats), mobilization c movement c posterior Rt shoulder glide c passive ER mobility.       PATIENT EDUCATION: 01/14/2022: Education details:Sleep position using pillow for support Person educated: Patient Education method: Explanation, Demonstration, Tactile cues, Verbal cues, and Handouts Education comprehension: verbalized understanding, returned demonstration, verbal cues required, and tactile cues required     HOME EXERCISE PROGRAM: Access Code: [email or web address] URL: [email or web address] Date: 01/26/2022 Prepared by: Narda Amber  Exercises - Seated Scapular Retraction  - 3-5 x daily - 7 x weekly - 1 sets - 10 reps - 3-5 hold - Supine Shoulder External Rotation in 45 Degrees Abduction AAROM with Dowel (Mirrored)  - 2-3 x daily - 7 x weekly - 1-2 sets - 10 reps - 5 hold - Sidelying Shoulder External Rotation (Mirrored)  - 1-2 x daily - 7 x  weekly - 2-3 sets - 10-15 reps - Sidelying Shoulder Abduction Palm Forward  - 1-2 x daily - 7 x weekly - 2-3 sets - 10-15 reps - Standing Shoulder Flexion AAROM with Dowel  - 1-2 x daily - 7 x weekly - 1-2 sets - 10  reps - Standing Shoulder Abduction AAROM with Dowel (Mirrored)  - 1-2 x daily - 7 x weekly - 1-2 sets - 10 reps - Standing Shoulder Row with Anchored Resistance  - 2 x daily - 7 x weekly - 1-2 sets - 10-15 reps - Shoulder Extension with Resistance  - 2 x daily - 7 x weekly - 1-2 sets - 10-15 reps - Doorway Pec Stretch at 60 Elevation  - 2 x daily - 7 x weekly - 3 reps - 20-30 seconds hold - Doorway Pec Stretch at 90 Degrees Abduction  - 2 x daily - 7 x weekly - 3 reps - 20-30 seconds hold - Shoulder Flexion Wall Walk  - 2 x daily - 7 x weekly - 5-10 reps - 3 second hold  ASSESSMENT:   CLINICAL IMPRESSION: Pt tolerating exercises well today. Pt still needing instructions to prevent shoulder hiking during flexed and abduction based gravity induced activities. Continue with skilled PT to progress toward pt's LTG's.      OBJECTIVE IMPAIRMENTS decreased activity tolerance, decreased coordination, decreased endurance, decreased mobility, decreased ROM, decreased strength, hypomobility, increased edema, increased fascial restrictions, impaired perceived functional ability, increased muscle spasms, impaired flexibility, impaired UE functional use, improper body mechanics, postural dysfunction, and pain.    ACTIVITY LIMITATIONS cleaning, community activity, driving, meal prep, occupation, laundry, and yard work.    PERSONAL FACTORS  HTN, migraines, history of TIA, arthritis Rt shoulder  are also affecting patient's functional outcome.      REHAB POTENTIAL: Good   CLINICAL DECISION MAKING: Stable/uncomplicated   EVALUATION COMPLEXITY: Low     GOALS: Goals reviewed with patient? Yes Eval : 12/08/2021   Short term PT Goals (target date for Short term goals are 3 weeks  12/29/2021) Patient will demonstrate independent use of home exercise program to maintain progress from in clinic treatments. Goal status: MET 12/22/2021   Long term PT goals (target dates for all long term goals are 10 weeks  02/16/2022 )   1. Patient will demonstrate/report pain at worst less than or equal to 2/10 to facilitate minimal limitation in daily activity secondary to pain symptoms. Goal status: on going - assessed 01/12/2022   2. Patient will demonstrate independent use of home exercise program to facilitate ability to maintain/progress functional gains from skilled physical therapy services. Goal status:  on going - assessed 01/12/2022   3. Patient will demonstrate FOTO outcome > or = 64 % to indicate reduced disability due to condition. Goal status:  on going - assessed 01/12/2022   4.  Patient will demonstrate Rt GH joint mobility WFL to facilitate usual self care, dressing, reaching overhead at PLOF s limitation due to symptoms.  Goal status: on going - assessed 01/12/2022   5.  Patient will demonstrate Rt UE MMT > or = 4/5 throughout to facilitate usual lifting, carrying in functional activity to PLOF s limitation.   Goal status:  on going - assessed 01/12/2022   6.  Patient will demonstrate/report patient specific functional scale reporting on average 8/10 or greater for return to PLOF.   Goal status:on going - assessed 01/12/2022   7.  Patient will demonstrate/report return to work, sleep s restriction due to symptoms.  a.  Goal Status:  on going - assessed 01/12/2022     PLAN: PT FREQUENCY: 2x/week   PT DURATION: 10 weeks   PLANNED INTERVENTIONS:  Therapeutic exercises, Therapeutic activity, Neuro Muscular re-education, Balance training, Gait training, Patient/Family education, Joint mobilization, Stair training, DME instructions,  Dry Needling, Electrical stimulation, Cryotherapy, Moist heat, Taping, Ultrasound, Ionotophoresis 4mg /ml Dexamethasone, and Manual therapy.  All  included unless contraindicated   PLAN FOR NEXT SESSION: Avoid shrug in elevation against gravity.  Progressive strengthening c focus on higher reps versus heavy weights.   Narda Amber, PT, MPT 01/26/22 1:27 PM   01/26/22  1:27 PM

## 2022-01-28 ENCOUNTER — Encounter: Payer: BC Managed Care – PPO | Admitting: Rehabilitative and Restorative Service Providers"

## 2022-02-04 ENCOUNTER — Encounter: Payer: Self-pay | Admitting: Rehabilitative and Restorative Service Providers"

## 2022-02-04 ENCOUNTER — Ambulatory Visit (INDEPENDENT_AMBULATORY_CARE_PROVIDER_SITE_OTHER): Payer: BC Managed Care – PPO | Admitting: Rehabilitative and Restorative Service Providers"

## 2022-02-04 DIAGNOSIS — R600 Localized edema: Secondary | ICD-10-CM

## 2022-02-04 DIAGNOSIS — M25511 Pain in right shoulder: Secondary | ICD-10-CM | POA: Diagnosis not present

## 2022-02-04 DIAGNOSIS — G8929 Other chronic pain: Secondary | ICD-10-CM

## 2022-02-04 DIAGNOSIS — M6281 Muscle weakness (generalized): Secondary | ICD-10-CM | POA: Diagnosis not present

## 2022-02-04 DIAGNOSIS — R293 Abnormal posture: Secondary | ICD-10-CM

## 2022-02-04 NOTE — Therapy (Signed)
[facility] [address] [place], [place], [address] Phone: [phone]   Fax:  [phone]  Patient Details  Name: Tammy Aguirre MRN: [id number] Date of Birth: Jul 27, 1961 Referring Provider:  [name], MD  Encounter Date: 02/04/2022   OUTPATIENT PHYSICAL THERAPY TREATMENT NOTE   Patient Name: Tammy Aguirre MRN: [id number] DOB:08-26-1960, 61 y.o., female Today's Date: 02/04/2022  END OF SESSION:   PT End of Session - 02/04/22 1313     Visit Number 14    Number of Visits 20    Date for PT Re-Evaluation 02/16/22    Authorization Type [employer] 30% coinsurance    PT Start Time 0105    PT Stop Time 0148    PT Time Calculation (min) 43 min    Activity Tolerance Patient tolerated treatment well;No increased pain    Behavior During Therapy WFL for tasks assessed/performed                     Past Medical History:  Diagnosis Date   Anemia    History of   Arthritis    Right Shoulder   History of TIA (transient ischemic attack)    Hypertension    Migraines    History of   PONV (postoperative nausea and vomiting)    Past Surgical History:  Procedure Laterality Date   ABDOMINAL HYSTERECTOMY     CESAREAN SECTION     COLONOSCOPY     NECK SURGERY     plate in neck   SHOULDER ARTHROSCOPY WITH OPEN ROTATOR CUFF REPAIR AND DISTAL CLAVICLE ACROMINECTOMY Right 11/24/2021   Procedure: RIGHT SHOULDER ARTHROSCOPIC SUBACROMIAL DECOMPRESSION WITH OPEN ROTATOR CUFF REPAIR AND DISTAL CLAVICLE ACROMINECTOMY;  Surgeon: [name], MD;  Location: [facility];  Service: Orthopedics;  Laterality: Right;   UPPER GI ENDOSCOPY     Patient Active Problem List   Diagnosis Date Noted   Complete tear of right rotator cuff 10/29/2021   Pain in right shoulder 09/24/2021   Anterior knee pain, right 10/09/2020   Overweight with body mass index (BMI) 25.0-29.9 10/20/2017   Endometriosis 10/20/2017   Hypertensive disorder 10/20/2017   Cyst  of ovary 08/02/1996   Abnormal cervical Papanicolaou smear 08/03/1983    PCP: Seward Carol, MD   REFERRING PROVIDER: [name], MD   REFERRING DIAG: S46.011D (ICD-10-CM) - Traumatic complete tear of right rotator cuff, subsequent encounter  ONSET DATE: Surgery 11/24/2021  THERAPY DIAG:  Chronic right shoulder pain  Muscle weakness (generalized)  Localized edema  Abnormal posture  PERTINENT HISTORY: 11/24/2021 surgery: SAD, open RTC repair and distal clavicle acrominectomy.  HTN, migraines, history of TIA, arthritis Rt shoulder  PRECAUTIONS: None at this time (progressed out of protocol limits)  SUBJECTIVE:  I went back into the office today and I carry all the tension in my shoulders. Last night I moved a box with my left arm and left knee and I felt it right here on the right side after (pt pointing to [place]).   PAIN:  NPRS scale: 4.5/10 Pain location: Rt shoulder Pain description: toothache Aggravating factors: moving Relieving factors: heat   OBJECTIVE:    PATIENT SURVEYS:  01/12/2022: FOTO update 46  12/08/2021: FOTO eval: 43  predicted:   64   Patient Specific Functional Scale (0 unable to 10 no difficulty) 1) Hair/self care:             01/07/2022: 5/10 Eval 12/08/2021 : 0/10 2) Food prep 01/07/2022:  7/10         Eval 12/08/2021 :  3/10 3) Computer work             01/07/2022:  6/10 Eval 12/08/2021 : 6/10   COGNITION: 12/08/2021: Overall cognitive status: Within functional limits for tasks assessed                                     SENSATION: 12/08/2021: WFL   POSTURE: 12/08/2021:  Wearing sling   UPPER EXTREMITY ROM:    ROM Right PROM in supine 12/08/2021 Left 12/08/2021 Right PROM in supine 12/16/2021 Rt PROM  Supine 12/22/2021 Rt PROM Supine 12/30/2021 Rt AROM in supine 01/12/2022 Rt  01/21/2022 Rt 01/26/22  Shoulder flexion 90, pain limited AROM in supine: 170 124, pain limited 130 deg c pain at end range 140 deg c pain end range 125  deg end range pain  130  Shoulder extension            Shoulder abduction 85, pain limited    90 deg c pain 105 deg c pain end range 90 AROM, 104 PROM end range pain    Shoulder adduction            Shoulder internal rotation Hand to belly c arm at side          Shoulder external rotation 0 deg c arm at side, pain limited 70 in 45 deg abduction AROM 20 deg c arm in 20 deg abduction, pain limited 18 deg arm in 20 deg abd c pain  40 deg in 45 deg abduction c end range pain/tightness AROM 30, PROM 40 deg in 45 deg abduction c end range pain/tightness PROM 60 deg in 45 deg abduction AROM 40 PROM 60 Shoulder abd 45 deg  Elbow flexion            Elbow extension            Wrist flexion            Wrist extension            Wrist ulnar deviation            Wrist radial deviation            Wrist pronation            Wrist supination            (Blank rows = not tested)   UPPER EXTREMITY MMT:   MMT Right 12/08/2021 Not tested due to surgery Right 01/05/2022  (Minimal resistance testing today) Right 01/21/2022  Shoulder flexion   2/5 2+/5   Shoulder extension      Shoulder abduction   1+   Shoulder adduction      Shoulder internal rotation   3+/5   Shoulder external rotation   1+   Middle trapezius      Lower trapezius      Elbow flexion      Elbow extension      Wrist flexion      Wrist extension      Wrist ulnar deviation      Wrist radial deviation      Wrist pronation      Wrist supination      Grip strength (lbs)      (Blank rows = not tested)   JOINT MOBILITY TESTING:  12/08/2021:Early to mid range Rt GH joint mobility testing  WFL at this time, muscle guarding noted overall.    PALPATION:  12/08/2021: Tenderness to touch surrounding Rt shoulder joint in all sides               TODAY[facility] TREATMENT:   [facility]:                                                DATE: 02/04/22 Therapeutic Exercise: Pulleys flexion x 2 min Pulleys scaption x 2 min Wall ladder x 10  flexion Supine RTB horiz abdct x 15 bil with pt reports of "same tightness in R neck from last night." Seated R Levator, Upper Trap, scalene stretch preformed 2x30 sec  Supine RTB hip pull x 15, RTB bil ER x 15, GTB R row x 20, GTB R shoulder extension x 15 Sidelying R 2 lb ER x 15, R 2 lb lateral raise x 15 Repeat of cervical stretches above Seated scapular depression x 15 bil UBE level 3 x 4 min posterior only with concentration on posture PT monitored via tactile cues for shoulder hike during all therex  01/26/2022: Therex:  Pulleys: flexion and scaption x 2 minutes each         UBE fwd/back 4 mins each way lvl 2.0  Rolling red physioball up wall x 10 holding 5 seconds at end range Standing wand 2# flexion to shoulder hike AAROM abduction with 2 # bar Sidelying Rt shoulder ER 2 x 15 c towel at side 2 lb  Sidelying AROM abduction 2 x 15 1 lb Standing Level 3 band Rows 2 x 15 each  Door stretch: x 2 holding 20 sec  Manual: PROM c mobilization c Rt posterior GH glide     01/21/2022:             Therex:                                  UBE fwd/back 4 mins each way lvl 2.0 c 15 second interval at end of each minute.    UE ranger AAROM flexion 2 x 10, scaption x 10    Standing wand 1 lb flexion to shrug onset (80 degrees range) and abduction AAROM c focus on eccentric lowering control from Rt shoulder x 10 each way    Sidelying Rt shoulder ER 2 x 15 c towel at side 2 lb    Sidelying AROM abduction 2 x 15 1 lb      Tband rows, gh ext green 2 x 15 each     Manual:  Supine Rt arm passive ER stretch in 45 deg abduction longer duration several times.  Mobilization c movement c posterior Rt GH glide c passive movement.   01/19/2022:             Therex:                                  Pulleys flexion, abduction 2 mins each way    Sidelying Rt shoulder ER 2 x 10 c towel at side 1 lb    Sidelying AROM abduction 2 x 10 1 lb   Supine horizontal abduction red band 2 x 10     Supine AROM  in 30 deg bed elevation to fatigue x 20, x 20   Supine 90 deg flexion circles 2x 30 cw, 2x 30 ccw 2 lb weight   Tband rows, gh ext green x 20 each     Manual:  Compression c Rt lat c activation c stretch into flexion/scaption after.  Contract/relax techniques for ER mobility gains (subscapularis , elevation gains (lats), mobilization c movement c posterior Rt shoulder glide c passive ER mobility.       PATIENT EDUCATION: 01/14/2022: Education details:Sleep position using pillow for support Person educated: Patient Education method: Explanation, Demonstration, Tactile cues, Verbal cues, and Handouts Education comprehension: verbalized understanding, returned demonstration, verbal cues required, and tactile cues required     HOME EXERCISE PROGRAM: Access Code: [email or web address] URL: [email or web address] Date: 01/26/2022 Prepared by: Kearney Hard  Exercises - Seated Scapular Retraction  - 3-5 x daily - 7 x weekly - 1 sets - 10 reps - 3-5 hold - Supine Shoulder External Rotation in 45 Degrees Abduction AAROM with Dowel (Mirrored)  - 2-3 x daily - 7 x weekly - 1-2 sets - 10 reps - 5 hold - Sidelying Shoulder External Rotation (Mirrored)  - 1-2 x daily - 7 x weekly - 2-3 sets - 10-15 reps - Sidelying Shoulder Abduction Palm Forward  - 1-2 x daily - 7 x weekly - 2-3 sets - 10-15 reps - Standing Shoulder Flexion AAROM with Dowel  - 1-2 x daily - 7 x weekly - 1-2 sets - 10 reps - Standing Shoulder Abduction AAROM with Dowel (Mirrored)  - 1-2 x daily - 7 x weekly - 1-2 sets - 10 reps - Standing Shoulder Row with Anchored Resistance  - 2 x daily - 7 x weekly - 1-2 sets - 10-15 reps - Shoulder Extension with Resistance  - 2 x daily - 7 x weekly - 1-2 sets - 10-15 reps - Doorway Pec Stretch at 60 Elevation  - 2 x daily - 7 x weekly - 3 reps - 20-30 seconds hold - Doorway Pec Stretch at 90 Degrees Abduction  - 2 x daily - 7 x weekly - 3 reps - 20-30 seconds hold - Shoulder Flexion Wall  Walk  - 2 x daily - 7 x weekly - 5-10 reps - 3 second hold  ASSESSMENT:   CLINICAL IMPRESSION: Pt tolerating exercises well today. Cervical stretches R performed today during treatment due to R Upper Trap/Levator tightness hindering R UE activity participation. Pt was able to continue R UE exercises after. Continue with skilled PT to progress toward pt's LTG's.      OBJECTIVE IMPAIRMENTS decreased activity tolerance, decreased coordination, decreased endurance, decreased mobility, decreased ROM, decreased strength, hypomobility, increased edema, increased fascial restrictions, impaired perceived functional ability, increased muscle spasms, impaired flexibility, impaired UE functional use, improper body mechanics, postural dysfunction, and pain.    ACTIVITY LIMITATIONS cleaning, community activity, driving, meal prep, occupation, laundry, and yard work.    PERSONAL FACTORS  HTN, migraines, history of TIA, arthritis Rt shoulder  are also affecting patient's functional outcome.      REHAB POTENTIAL: Good   CLINICAL DECISION MAKING: Stable/uncomplicated   EVALUATION COMPLEXITY: Low     GOALS: Goals reviewed with patient? Yes Eval : 12/08/2021   Short term PT Goals (target date for Short term goals are 3 weeks 12/29/2021) Patient will demonstrate independent use of home exercise program to maintain progress from in clinic treatments. Goal status: MET 12/22/2021   Long term PT goals (target dates for all long  term goals are 10 weeks  02/16/2022 )   1. Patient will demonstrate/report pain at worst less than or equal to 2/10 to facilitate minimal limitation in daily activity secondary to pain symptoms. Goal status: on going - assessed 01/12/2022   2. Patient will demonstrate independent use of home exercise program to facilitate ability to maintain/progress functional gains from skilled physical therapy services. Goal status:  on going - assessed 01/12/2022   3. Patient will demonstrate FOTO  outcome > or = 64 % to indicate reduced disability due to condition. Goal status:  on going - assessed 01/12/2022   4.  Patient will demonstrate Rt [place] joint mobility WFL to facilitate usual self care, dressing, reaching overhead at PLOF s limitation due to symptoms.  Goal status: on going - assessed 01/12/2022   5.  Patient will demonstrate Rt UE MMT > or = 4/5 throughout to facilitate usual lifting, carrying in functional activity to PLOF s limitation.   Goal status:  on going - assessed 01/12/2022   6.  Patient will demonstrate/report patient specific functional scale reporting on average 8/10 or greater for return to PLOF.   Goal status:on going - assessed 01/12/2022   7.  Patient will demonstrate/report return to work, sleep s restriction due to symptoms.  a.  Goal Status:  on going - assessed 01/12/2022     PLAN: PT FREQUENCY: 2x/week   PT DURATION: 10 weeks   PLANNED INTERVENTIONS:  Therapeutic exercises, Therapeutic activity, Neuro Muscular re-education, Balance training, Gait training, Patient/Family education, Joint mobilization, Stair training, DME instructions, Dry Needling, Electrical stimulation, Cryotherapy, Moist heat, Taping, Ultrasound, Ionotophoresis 38m/ml Dexamethasone, and Manual therapy.  All included unless contraindicated   PLAN FOR NEXT SESSION: Avoid shrug in elevation against gravity.  Progressive strengthening c focus on higher reps versus heavy weights.      NAmerica Brown PT 02/04/2022, 1:47 PM  [facility] [address] [place] [address]Phone: [phone]  Fax:  [phone]

## 2022-02-05 ENCOUNTER — Ambulatory Visit (INDEPENDENT_AMBULATORY_CARE_PROVIDER_SITE_OTHER): Payer: BC Managed Care – PPO | Admitting: Rehabilitative and Restorative Service Providers"

## 2022-02-05 ENCOUNTER — Encounter: Payer: Self-pay | Admitting: Rehabilitative and Restorative Service Providers"

## 2022-02-05 DIAGNOSIS — M25511 Pain in right shoulder: Secondary | ICD-10-CM

## 2022-02-05 DIAGNOSIS — R293 Abnormal posture: Secondary | ICD-10-CM

## 2022-02-05 DIAGNOSIS — G8929 Other chronic pain: Secondary | ICD-10-CM

## 2022-02-05 DIAGNOSIS — M6281 Muscle weakness (generalized): Secondary | ICD-10-CM | POA: Diagnosis not present

## 2022-02-05 DIAGNOSIS — R600 Localized edema: Secondary | ICD-10-CM

## 2022-02-05 NOTE — Therapy (Signed)
[facility] Physical Therapy [address] [place], [place], [address] Phone: [phone]   Fax:  [phone]  Patient Details  Name: Tammy Aguirre MRN: [id number] Date of Birth: 20-Sep-1960 Referring Provider:  [name], MD  Encounter Date: 02/05/2022  [facility] Physical Therapy [address] [place], [place], [address] Phone: [phone]   Fax:  [phone]  Patient Details  Name: Tammy Aguirre MRN: [id number] Date of Birth: [birth year] Referring Provider:  [name], MD  Encounter Date: 02/05/2022   OUTPATIENT PHYSICAL THERAPY TREATMENT NOTE   Patient Name: Tammy Aguirre MRN: [id number] DOB:[birth year], 61 y.o., female Today's Date: 02/05/2022  END OF SESSION:   PT End of Session - 02/05/22 0801     Visit Number 15    Number of Visits 20    Date for PT Re-Evaluation 02/16/22    PT Start Time 0758    PT Stop Time 6720    PT Time Calculation (min) 46 min    Activity Tolerance Patient tolerated treatment well;No increased pain    Behavior During Therapy WFL for tasks assessed/performed                      Past Medical History:  Diagnosis Date   Anemia    History of   Arthritis    Right Shoulder   History of TIA (transient ischemic attack)    Hypertension    Migraines    History of   PONV (postoperative nausea and vomiting)    Past Surgical History:  Procedure Laterality Date   ABDOMINAL HYSTERECTOMY     CESAREAN SECTION     COLONOSCOPY     NECK SURGERY     plate in neck   SHOULDER ARTHROSCOPY WITH OPEN ROTATOR CUFF REPAIR AND DISTAL CLAVICLE ACROMINECTOMY Right 11/24/2021   Procedure: RIGHT SHOULDER ARTHROSCOPIC SUBACROMIAL DECOMPRESSION WITH OPEN ROTATOR CUFF REPAIR AND DISTAL CLAVICLE ACROMINECTOMY;  Surgeon: [name], MD;  Location: [facility];  Service: Orthopedics;  Laterality: Right;   UPPER GI ENDOSCOPY     Patient Active Problem List   Diagnosis Date Noted    Complete tear of right rotator cuff 10/29/2021   Pain in right shoulder 09/24/2021   Anterior knee pain, right 10/09/2020   Overweight with body mass index (BMI) 25.0-29.9 10/20/2017   Endometriosis 10/20/2017   Hypertensive disorder 10/20/2017   Cyst of ovary 08/02/1996   Abnormal cervical Papanicolaou smear 08/03/1983    PCP: Seward Carol, MD   REFERRING PROVIDER: [name], MD   REFERRING DIAG: S46.011D (ICD-10-CM) - Traumatic complete tear of right rotator cuff, subsequent encounter  ONSET DATE: Surgery 11/24/2021  THERAPY DIAG:  Chronic right shoulder pain  Muscle weakness (generalized)  Localized edema  Abnormal posture  PERTINENT HISTORY: 11/24/2021 surgery: SAD, open RTC repair and distal clavicle acrominectomy.  HTN, migraines, history of TIA, arthritis Rt shoulder  PRECAUTIONS: None at this time (progressed out of protocol limits)  SUBJECTIVE:  I am sore on top of the shoulder. My neck has been stiff but it is from a long time ago.  PAIN:  NPRS scale: 4.5/10 Pain location: Rt shoulder Pain description: toothache Aggravating factors: moving Relieving factors: heat   OBJECTIVE:    PATIENT SURVEYS:  01/12/2022: FOTO update 46  12/08/2021: FOTO eval: 43  predicted:   64   Patient Specific Functional Scale (0 unable to 10 no difficulty) 1) Hair/self care:  01/07/2022: 5/10 Eval 12/08/2021 : 0/10 2) Food prep 01/07/2022:      7/10         Eval 12/08/2021 :  3/10 3) Computer work             01/07/2022:  6/10 Eval 12/08/2021 : 6/10   COGNITION: 12/08/2021: Overall cognitive status: Within functional limits for tasks assessed                                     SENSATION: 12/08/2021: WFL   POSTURE: 12/08/2021:  Wearing sling   UPPER EXTREMITY ROM:    ROM Right PROM in supine 12/08/2021 Left 12/08/2021 Right PROM in supine 12/16/2021 Rt PROM  Supine 12/22/2021 Rt PROM Supine 12/30/2021 Rt AROM in supine 01/12/2022 Rt  01/21/2022 Rt 01/26/22   Shoulder flexion 90, pain limited AROM in supine: 170 124, pain limited 130 deg c pain at end range 140 deg c pain end range 125 deg end range pain  130  Shoulder extension            Shoulder abduction 85, pain limited    90 deg c pain 105 deg c pain end range 90 AROM, 104 PROM end range pain    Shoulder adduction            Shoulder internal rotation Hand to belly c arm at side          Shoulder external rotation 0 deg c arm at side, pain limited 70 in 45 deg abduction AROM 20 deg c arm in 20 deg abduction, pain limited 18 deg arm in 20 deg abd c pain  40 deg in 45 deg abduction c end range pain/tightness AROM 30, PROM 40 deg in 45 deg abduction c end range pain/tightness PROM 60 deg in 45 deg abduction AROM 40 PROM 60 Shoulder abd 45 deg  Elbow flexion            Elbow extension            Wrist flexion            Wrist extension            Wrist ulnar deviation            Wrist radial deviation            Wrist pronation            Wrist supination            (Blank rows = not tested)   UPPER EXTREMITY MMT:   MMT Right 12/08/2021 Not tested due to surgery Right 01/05/2022  (Minimal resistance testing today) Right 01/21/2022  Shoulder flexion   2/5 2+/5   Shoulder extension      Shoulder abduction   1+   Shoulder adduction      Shoulder internal rotation   3+/5   Shoulder external rotation   1+   Middle trapezius      Lower trapezius      Elbow flexion      Elbow extension      Wrist flexion      Wrist extension      Wrist ulnar deviation      Wrist radial deviation      Wrist pronation      Wrist supination      Grip strength (lbs)      (Blank rows = not tested)  JOINT MOBILITY TESTING:  12/08/2021:Early to mid range Rt GH joint mobility testing WFL at this time, muscle guarding noted overall.    PALPATION:  12/08/2021: Tenderness to touch surrounding Rt shoulder joint in all sides               TODAY'S TREATMENT:   [facility] Adult PT Treatment:                                                 DATE: 02/05/22 Therapeutic Exercise: UBE UEs only level 5 x 6 min posterior with PT discussion with posture Pulleys flexion, scaption x 1 min each Standing BTB row x 20, BTB shoulder ext x 20, RTB shoulder ext/abdct combo x 20, RTB ER x 20 Wall ladder flexion x 10 with 1-2 sec hold at the top 1 lb shelf reach (bottom and second) with PT max verbal and tactile cues for relaxing R Upper Trap Seated R scalene, Upper Trap, Levator stretches 2x30 sec each direction Supine R scap punch 2 lbs x 25 Supine rhythmic stabilization 2 lbs up/down, side to side x 20 each with scap punch with pt not needing any Upper Trap relaxation cueing Sidelying R 2 lb ER x 20 Sidelying R horiz abdct without weight x 12 Pt required maximal verbal/tactile cueing for resetting and not using compensatory muscles with movements with or weight weights   [facility] Adult PT Treatment:                                                DATE: 02/04/22 Therapeutic Exercise: Pulleys flexion x 2 min Pulleys scaption x 2 min Wall ladder x 10 flexion Supine RTB horiz abdct x 15 bil with pt reports of "same tightness in R neck from last night." Seated R Levator, Upper Trap, scalene stretch preformed 2x30 sec  Supine RTB hip pull x 15, RTB bil ER x 15, GTB R row x 20, GTB R shoulder extension x 15 Sidelying R 2 lb ER x 15, R 2 lb lateral raise x 15 Repeat of cervical stretches above Seated scapular depression x 15 bil UBE level 3 x 4 min posterior only with concentration on posture PT monitored via tactile cues for shoulder hike during all therex  01/26/2022: Therex:  Pulleys: flexion and scaption x 2 minutes each         UBE fwd/back 4 mins each way lvl 2.0  Rolling red physioball up wall x 10 holding 5 seconds at end range Standing wand 2# flexion to shoulder hike AAROM abduction with 2 # bar Sidelying Rt shoulder ER 2 x 15 c towel at side 2 lb  Sidelying AROM abduction 2 x 15 1 lb Standing Level 3 band Rows 2  x 15 each  Door stretch: x 2 holding 20 sec  Manual: PROM c mobilization c Rt posterior GH glide     01/21/2022:             Therex:                                  UBE fwd/back 4 mins each way lvl 2.0 c 15 second interval at  end of each minute.    UE ranger AAROM flexion 2 x 10, scaption x 10    Standing wand 1 lb flexion to shrug onset (80 degrees range) and abduction AAROM c focus on eccentric lowering control from Rt shoulder x 10 each way    Sidelying Rt shoulder ER 2 x 15 c towel at side 2 lb    Sidelying AROM abduction 2 x 15 1 lb      Tband rows, gh ext green 2 x 15 each     Manual:  Supine Rt arm passive ER stretch in 45 deg abduction longer duration several times.  Mobilization c movement c posterior Rt GH glide c passive movement.   01/19/2022:             Therex:                                  Pulleys flexion, abduction 2 mins each way    Sidelying Rt shoulder ER 2 x 10 c towel at side 1 lb    Sidelying AROM abduction 2 x 10 1 lb   Supine horizontal abduction red band 2 x 10     Supine AROM in 30 deg bed elevation to fatigue x 20, x 20   Supine 90 deg flexion circles 2x 30 cw, 2x 30 ccw 2 lb weight   Tband rows, gh ext green x 20 each     Manual:  Compression c Rt lat c activation c stretch into flexion/scaption after.  Contract/relax techniques for ER mobility gains (subscapularis , elevation gains (lats), mobilization c movement c posterior Rt shoulder glide c passive ER mobility.       PATIENT EDUCATION: 01/14/2022: Education details:Sleep position using pillow for support Person educated: Patient Education method: Explanation, Demonstration, Tactile cues, Verbal cues, and Handouts Education comprehension: verbalized understanding, returned demonstration, verbal cues required, and tactile cues required     HOME EXERCISE PROGRAM: Access Code: [email or web address] URL: [email or web address] Date: 01/26/2022 Prepared by: Kearney Hard  Exercises -  Seated Scapular Retraction  - 3-5 x daily - 7 x weekly - 1 sets - 10 reps - 3-5 hold - Supine Shoulder External Rotation in 45 Degrees Abduction AAROM with Dowel (Mirrored)  - 2-3 x daily - 7 x weekly - 1-2 sets - 10 reps - 5 hold - Sidelying Shoulder External Rotation (Mirrored)  - 1-2 x daily - 7 x weekly - 2-3 sets - 10-15 reps - Sidelying Shoulder Abduction Palm Forward  - 1-2 x daily - 7 x weekly - 2-3 sets - 10-15 reps - Standing Shoulder Flexion AAROM with Dowel  - 1-2 x daily - 7 x weekly - 1-2 sets - 10 reps - Standing Shoulder Abduction AAROM with Dowel (Mirrored)  - 1-2 x daily - 7 x weekly - 1-2 sets - 10 reps - Standing Shoulder Row with Anchored Resistance  - 2 x daily - 7 x weekly - 1-2 sets - 10-15 reps - Shoulder Extension with Resistance  - 2 x daily - 7 x weekly - 1-2 sets - 10-15 reps - Doorway Pec Stretch at 60 Elevation  - 2 x daily - 7 x weekly - 3 reps - 20-30 seconds hold - Doorway Pec Stretch at 90 Degrees Abduction  - 2 x daily - 7 x weekly - 3 reps - 20-30 seconds hold - Shoulder Flexion Wall Walk  - 2 x daily -  7 x weekly - 5-10 reps - 3 second hold  ASSESSMENT:   CLINICAL IMPRESSION: Pt tolerating exercises well today. Pt continues to have overly active R cervical muscles with functional R UE mobility with PT concentration on relaxing muscles with motion.  Continue with skilled PT to progress toward pt's LTG's. No pain with tx.     OBJECTIVE IMPAIRMENTS decreased activity tolerance, decreased coordination, decreased endurance, decreased mobility, decreased ROM, decreased strength, hypomobility, increased edema, increased fascial restrictions, impaired perceived functional ability, increased muscle spasms, impaired flexibility, impaired UE functional use, improper body mechanics, postural dysfunction, and pain.    ACTIVITY LIMITATIONS cleaning, community activity, driving, meal prep, occupation, laundry, and yard work.    PERSONAL FACTORS  HTN, migraines, history of  TIA, arthritis Rt shoulder  are also affecting patient's functional outcome.      REHAB POTENTIAL: Good   CLINICAL DECISION MAKING: Stable/uncomplicated   EVALUATION COMPLEXITY: Low     GOALS: Goals reviewed with patient? Yes Eval : 12/08/2021   Short term PT Goals (target date for Short term goals are 3 weeks 12/29/2021) Patient will demonstrate independent use of home exercise program to maintain progress from in clinic treatments. Goal status: MET 12/22/2021   Long term PT goals (target dates for all long term goals are 10 weeks  02/16/2022 )   1. Patient will demonstrate/report pain at worst less than or equal to 2/10 to facilitate minimal limitation in daily activity secondary to pain symptoms. Goal status: on going - assessed 01/12/2022   2. Patient will demonstrate independent use of home exercise program to facilitate ability to maintain/progress functional gains from skilled physical therapy services. Goal status:  on going - assessed 01/12/2022   3. Patient will demonstrate FOTO outcome > or = 64 % to indicate reduced disability due to condition. Goal status:  on going - assessed 01/12/2022   4.  Patient will demonstrate Rt [place] joint mobility WFL to facilitate usual self care, dressing, reaching overhead at PLOF s limitation due to symptoms.  Goal status: on going - assessed 01/12/2022   5.  Patient will demonstrate Rt UE MMT > or = 4/5 throughout to facilitate usual lifting, carrying in functional activity to PLOF s limitation.   Goal status:  on going - assessed 01/12/2022   6.  Patient will demonstrate/report patient specific functional scale reporting on average 8/10 or greater for return to PLOF.   Goal status:on going - assessed 01/12/2022   7.  Patient will demonstrate/report return to work, sleep s restriction due to symptoms.  a.  Goal Status:  on going - assessed 01/12/2022     PLAN: PT FREQUENCY: 2x/week   PT DURATION: 10 weeks   PLANNED INTERVENTIONS:   Therapeutic exercises, Therapeutic activity, Neuro Muscular re-education, Balance training, Gait training, Patient/Family education, Joint mobilization, Stair training, DME instructions, Dry Needling, Electrical stimulation, Cryotherapy, Moist heat, Taping, Ultrasound, Ionotophoresis 58m/ml Dexamethasone, and Manual therapy.  All included unless contraindicated   PLAN FOR NEXT SESSION: Avoid shrug in elevation against gravity.  Progressive strengthening c focus on higher reps versus heavy weights.      NAmerica Brown PT 02/05/2022, 8:46 AM  [facility] [address] [address]Phone: [phone]  Fax:  [phone]

## 2022-02-08 ENCOUNTER — Encounter: Payer: BC Managed Care – PPO | Admitting: Rehabilitative and Restorative Service Providers"

## 2022-02-08 DIAGNOSIS — H43811 Vitreous degeneration, right eye: Secondary | ICD-10-CM | POA: Diagnosis not present

## 2022-02-11 ENCOUNTER — Ambulatory Visit (INDEPENDENT_AMBULATORY_CARE_PROVIDER_SITE_OTHER): Payer: BC Managed Care – PPO | Admitting: Rehabilitative and Restorative Service Providers"

## 2022-02-11 ENCOUNTER — Encounter: Payer: Self-pay | Admitting: Rehabilitative and Restorative Service Providers"

## 2022-02-11 DIAGNOSIS — R293 Abnormal posture: Secondary | ICD-10-CM

## 2022-02-11 DIAGNOSIS — R600 Localized edema: Secondary | ICD-10-CM | POA: Diagnosis not present

## 2022-02-11 DIAGNOSIS — G8929 Other chronic pain: Secondary | ICD-10-CM

## 2022-02-11 DIAGNOSIS — M6281 Muscle weakness (generalized): Secondary | ICD-10-CM

## 2022-02-11 DIAGNOSIS — M25511 Pain in right shoulder: Secondary | ICD-10-CM

## 2022-02-11 NOTE — Therapy (Addendum)
OUTPATIENT PHYSICAL THERAPY TREATMENT NOTE/ PROGRESS NOTE /DISCHARGE   Patient Name: Tammy Aguirre MRN: [id number] DOB:1961/04/22, 61 y.o., female Today's Date: 02/11/2022  Progress Note Reporting Period 01/12/2022 to 02/11/2022  See note below for Objective Data and Assessment of Progress/Goals.      END OF SESSION:   PT End of Session - 02/11/22 1409     Visit Number 16    Number of Visits 20    Date for PT Re-Evaluation 02/16/22    Authorization Type [employer] 30% coinsurance    PT Start Time 1412    PT Stop Time 1452    PT Time Calculation (min) 40 min    Activity Tolerance Patient tolerated treatment well    Behavior During Therapy WFL for tasks assessed/performed                       Past Medical History:  Diagnosis Date   Anemia    History of   Arthritis    Right Shoulder   History of TIA (transient ischemic attack)    Hypertension    Migraines    History of   PONV (postoperative nausea and vomiting)    Past Surgical History:  Procedure Laterality Date   ABDOMINAL HYSTERECTOMY     CESAREAN SECTION     COLONOSCOPY     NECK SURGERY     plate in neck   SHOULDER ARTHROSCOPY WITH OPEN ROTATOR CUFF REPAIR AND DISTAL CLAVICLE ACROMINECTOMY Right 11/24/2021   Procedure: RIGHT SHOULDER ARTHROSCOPIC SUBACROMIAL DECOMPRESSION WITH OPEN ROTATOR CUFF REPAIR AND DISTAL CLAVICLE ACROMINECTOMY;  Surgeon: [name], MD;  Location: [facility];  Service: Orthopedics;  Laterality: Right;   UPPER GI ENDOSCOPY     Patient Active Problem List   Diagnosis Date Noted   Complete tear of right rotator cuff 10/29/2021   Pain in right shoulder 09/24/2021   Anterior knee pain, right 10/09/2020   Overweight with body mass index (BMI) 25.0-29.9 10/20/2017   Endometriosis 10/20/2017   Hypertensive disorder 10/20/2017   Cyst of ovary 08/02/1996   Abnormal cervical Papanicolaou smear 08/03/1983    PCP: Seward Carol, MD   REFERRING PROVIDER: [name], MD   REFERRING DIAG: S46.011D (ICD-10-CM) - Traumatic complete tear of right rotator cuff, subsequent encounter  ONSET DATE: Surgery 11/24/2021  THERAPY DIAG:  Chronic right shoulder pain  Muscle weakness (generalized)  Localized edema  Abnormal posture  PERTINENT HISTORY: 11/24/2021 surgery: SAD, open RTC repair and distal clavicle acrominectomy.  HTN, migraines, history of TIA, arthritis Rt shoulder  PRECAUTIONS: None at this time (progressed out of protocol limits)  SUBJECTIVE:  Pt indicated complaints in Rt shoulder 3/10 at night at worst in last week.  Pt mentioned complaints of Rt upper trap region pain and tightness, reported as aggravating.   Pt indicated overall improvement to normal around 65%.   PAIN:  NPRS scale: 3/10 Pain location: Rt shoulder Pain description: toothache Aggravating factors: moving Relieving factors: heat   OBJECTIVE:    PATIENT SURVEYS:  02/11/2022:  FOTO update 60  01/12/2022: FOTO update 46  12/08/2021: FOTO eval: 43  predicted:   64   Patient Specific Functional Scale (0 unable to 10 no difficulty) 1) Hair/self care:          02/11/2022:     7/10 01/07/2022: 5/10 Eval 12/08/2021 : 0/10 2) Food prep 02/11/2022: 10/10 01/07/2022:      7/10  Eval 12/08/2021 :  3/10 3) Computer work            02/11/2022:  10/10 01/07/2022:  6/10 Eval 12/08/2021 : 6/10   COGNITION: 12/08/2021: Overall cognitive status: Within functional limits for tasks assessed                                     SENSATION: 12/08/2021: WFL   POSTURE: 12/08/2021:  Wearing sling   UPPER EXTREMITY ROM:    ROM Right PROM in supine 12/08/2021 Left 12/08/2021 Right PROM in supine 12/16/2021 Rt PROM  Supine 12/22/2021 Rt PROM Supine 12/30/2021 Rt AROM in supine 01/12/2022 Rt  01/21/2022 Rt 01/26/22 Right 02/11/2022 AROM in supine  Shoulder flexion 90, pain limited AROM in supine: 170 124, pain limited 130 deg c pain at end range 140 deg c pain end range 125 deg end range  pain  130 140  Shoulder extension             Shoulder abduction 85, pain limited    90 deg c pain 105 deg c pain end range 90 AROM, 104 PROM end range pain   145  Shoulder adduction             Shoulder internal rotation Hand to belly c arm at side           Shoulder external rotation 0 deg c arm at side, pain limited 70 in 45 deg abduction AROM 20 deg c arm in 20 deg abduction, pain limited 18 deg arm in 20 deg abd c pain  40 deg in 45 deg abduction c end range pain/tightness AROM 30, PROM 40 deg in 45 deg abduction c end range pain/tightness PROM 60 deg in 45 deg abduction AROM 40 PROM 60 Shoulder abd 45 deg 60 deg in 45 deg shoulder abduction  Elbow flexion             Elbow extension             Wrist flexion             Wrist extension             Wrist ulnar deviation             Wrist radial deviation             Wrist pronation             Wrist supination             (Blank rows = not tested)   UPPER EXTREMITY MMT:   MMT Right 12/08/2021 Not tested due to surgery Right 01/05/2022  (Minimal resistance testing today) Right 01/21/2022 Right 02/11/2022  Shoulder flexion   2/5 2+/5 4/5  Shoulder extension       Shoulder abduction   1+  3+/5  Shoulder adduction       Shoulder internal rotation   3+/5  5/5  Shoulder external rotation   1+  4/5  Middle trapezius       Lower trapezius       Elbow flexion       Elbow extension       Wrist flexion       Wrist extension       Wrist ulnar deviation       Wrist radial deviation       Wrist pronation       Wrist   supination       Grip strength (lbs)       (Blank rows = not tested)   JOINT MOBILITY TESTING:  12/08/2021:Early to mid range Rt GH joint mobility testing WFL at this time, muscle guarding noted overall.    PALPATION:  12/08/2021: Tenderness to touch surrounding Rt shoulder joint in all sides               TODAY'S TREATMENT:   02/11/2022:             Therex:                                  Pulley flexion, scaption 2  mins each way   Review of HEP and progression c cues for techniques and performance   Sidelying Rt shoulder ER x 15 2 lb c towel at side   Sidelying Rt shoulder abduction x 15 2 lb   Standing blue band rows, gh ext bilateral x 20 each   Standing wall slides flexion to tolerance x 10    Rt upper trap self stretch 15 sec x 3   Manual:   Rt active compression to Rt upper trap.       02/05/22 Therapeutic Exercise: UBE UEs only level 5 x 6 min posterior with PT discussion with posture Pulleys flexion, scaption x 1 min each Standing BTB row x 20, BTB shoulder ext x 20, RTB shoulder ext/abdct combo x 20, RTB ER x 20 Wall ladder flexion x 10 with 1-2 sec hold at the top 1 lb shelf reach (bottom and second) with PT max verbal and tactile cues for relaxing R Upper Trap Seated R scalene, Upper Trap, Levator stretches 2x30 sec each direction Supine R scap punch 2 lbs x 25 Supine rhythmic stabilization 2 lbs up/down, side to side x 20 each with scap punch with pt not needing any Upper Trap relaxation cueing Sidelying R 2 lb ER x 20 Sidelying R horiz abdct without weight x 12 Pt required maximal verbal/tactile cueing for resetting and not using compensatory muscles with movements with or weight weights   02/04/22 Therapeutic Exercise: Pulleys flexion x 2 min Pulleys scaption x 2 min Wall ladder x 10 flexion Supine RTB horiz abdct x 15 bil with pt reports of "same tightness in R neck from last night." Seated R Levator, Upper Trap, scalene stretch preformed 2x30 sec  Supine RTB hip pull x 15, RTB bil ER x 15, GTB R row x 20, GTB R shoulder extension x 15 Sidelying R 2 lb ER x 15, R 2 lb lateral raise x 15 Repeat of cervical stretches above Seated scapular depression x 15 bil UBE level 3 x 4 min posterior only with concentration on posture PT monitored via tactile cues for shoulder hike during all therex  01/26/2022: Therex:  Pulleys: flexion and scaption x 2 minutes each         UBE fwd/back  4 mins each way lvl 2.0  Rolling red physioball up wall x 10 holding 5 seconds at end range Standing wand 2# flexion to shoulder hike AAROM abduction with 2 # bar Sidelying Rt shoulder ER 2 x 15 c towel at side 2 lb  Sidelying AROM abduction 2 x 15 1 lb Standing Level 3 band Rows 2 x 15 each  Door stretch: x 2 holding 20 sec  Manual: PROM c mobilization c Rt posterior GH glide  PATIENT EDUCATION: 02/11/2022: Education details:Sleep position using pillow for support Person educated: Patient Education method: Explanation, Demonstration, Tactile cues, Verbal cues, and Handouts Education comprehension: verbalized understanding, returned demonstration, verbal cues required, and tactile cues required     HOME EXERCISE PROGRAM: Access Code: [email or web address] URL: [email or web address] Date: 02/11/2022 Prepared by: Michael Wright  Exercises - Seated Scapular Retraction  - 3-5 x daily - 7 x weekly - 1 sets - 5 reps - 3-5 hold - Sidelying Shoulder External Rotation (Mirrored)  - 1-2 x daily - 7 x weekly - 2-3 sets - 10-15 reps - Sidelying Shoulder Abduction Palm Forward  - 1-2 x daily - 7 x weekly - 2-3 sets - 10-15 reps - Standing Shoulder Row with Anchored Resistance  - 1-2 x daily - 7 x weekly - 1-2 sets - 10-15 reps - Shoulder Extension with Resistance  - 1-2 x daily - 7 x weekly - 1-2 sets - 10-15 reps - Doorway Pec Stretch at 90 Degrees Abduction  - 2-3 x daily - 7 x weekly - 3 reps - 20-30 seconds hold - Standing shoulder flexion wall slides  - 1-2 x daily - 7 x weekly - 1-2 sets - 10 reps - Shoulder External Rotation with Anchored Resistance (Mirrored)  - 1-2 x daily - 7 x weekly - 2-3 sets - 10-15 reps - Seated Gentle Upper Trapezius Stretch  - 3-5 x daily - 7 x weekly - 1 sets - 2-3 reps - 15 hold  ASSESSMENT:   CLINICAL IMPRESSION: Pt has attended 16 visits overall during course of treatment, reporting pain at worst 3/10 in last week and overall improvement at 65%.   See objective data for updated information regarding current presentation.  Improvements noted at this time in strength and mobility compared to previously measured numbers.  Pt continued to present c necessity for continued gains in Rt shoulder mobility and strength to continue to improve functional mobility and tolerance for daily activity.   Pt elected to trial HEP over next 2 weeks prior to MD visit then reassess plan.      OBJECTIVE IMPAIRMENTS decreased activity tolerance, decreased coordination, decreased endurance, decreased mobility, decreased ROM, decreased strength, hypomobility, increased edema, increased fascial restrictions, impaired perceived functional ability, increased muscle spasms, impaired flexibility, impaired UE functional use, improper body mechanics, postural dysfunction, and pain.    ACTIVITY LIMITATIONS cleaning, community activity, driving, meal prep, occupation, laundry, and yard work.    PERSONAL FACTORS  HTN, migraines, history of TIA, arthritis Rt shoulder  are also affecting patient's functional outcome.      REHAB POTENTIAL: Good   CLINICAL DECISION MAKING: Stable/uncomplicated   EVALUATION COMPLEXITY: Low     GOALS: Goals reviewed with patient? Yes Eval : 12/08/2021   Short term PT Goals (target date for Short term goals are 3 weeks 12/29/2021) Patient will demonstrate independent use of home exercise program to maintain progress from in clinic treatments. Goal status: MET 12/22/2021   Long term PT goals (target dates for all long term goals are 10 weeks  02/16/2022 )   1. Patient will demonstrate/report pain at worst less than or equal to 2/10 to facilitate minimal limitation in daily activity secondary to pain symptoms. Goal status: on going - assessed 02/11/2022   2. Patient will demonstrate independent use of home exercise program to facilitate ability to maintain/progress functional gains from skilled physical therapy services. Goal status:  on going -  assessed 02/11/2022   3. Patient will demonstrate FOTO outcome > or =   64 % to indicate reduced disability due to condition. Goal status:  on going - assessed 02/11/2022   4.  Patient will demonstrate Rt Barclay joint mobility WFL to facilitate usual self care, dressing, reaching overhead at PLOF s limitation due to symptoms.  Goal status: on going - assessed 02/11/2022   5.  Patient will demonstrate Rt UE MMT > or = 4/5 throughout to facilitate usual lifting, carrying in functional activity to PLOF s limitation.   Goal status:  on going - assessed 02/11/2022   6.  Patient will demonstrate/report patient specific functional scale reporting on average 8/10 or greater for return to PLOF.   Goal status: MET 02/11/2022   7.  Patient will demonstrate/report return to work, sleep s restriction due to symptoms.  a.  Goal Status:  MET - assessed 02/11/2022     PLAN: PT FREQUENCY: 2x/week   PT DURATION: 10 weeks   PLANNED INTERVENTIONS:  Therapeutic exercises, Therapeutic activity, Neuro Muscular re-education, Balance training, Gait training, Patient/Family education, Joint mobilization, Stair training, DME instructions, Dry Needling, Electrical stimulation, Cryotherapy, Moist heat, Taping, Ultrasound, Ionotophoresis 40m/ml Dexamethasone, and Manual therapy.  All included unless contraindicated   PLAN FOR NEXT SESSION: Recert if returning.    MScot Jun PT, DPT, OCS, ATC 02/11/22  2:56 PM   PHYSICAL THERAPY DISCHARGE SUMMARY  Visits from Start of Care: 16  Current functional level related to goals / functional outcomes: See note   Remaining deficits: See note   Education / Equipment: HEP   Patient agrees to discharge. Patient goals were  mostly met . Patient is being discharged due to being pleased with the current functional level.  MScot Jun PT, DPT, OCS, ATC 03/15/22  8:26 AM

## 2022-02-25 ENCOUNTER — Ambulatory Visit (INDEPENDENT_AMBULATORY_CARE_PROVIDER_SITE_OTHER): Payer: BC Managed Care – PPO | Admitting: Orthopaedic Surgery

## 2022-02-25 ENCOUNTER — Encounter: Payer: Self-pay | Admitting: Orthopaedic Surgery

## 2022-02-25 DIAGNOSIS — S46011D Strain of muscle(s) and tendon(s) of the rotator cuff of right shoulder, subsequent encounter: Secondary | ICD-10-CM

## 2022-02-25 NOTE — Progress Notes (Signed)
Office Visit Note   Patient: Tammy Aguirre           Date of Birth: [birth year]           MRN: [id number] Visit Date: 02/25/2022              Requested by: Renford Dills, MD [address] [place],  [place] [address] PCP: Renford Dills, MD   Assessment & Plan: Visit Diagnoses:  1. Traumatic complete tear of right rotator cuff, subsequent encounter     Plan: Ms. Mohiuddin is just over 3 months status post rotator cuff tear repair with a mini incision right shoulder and has done well in terms of pain relief except she has developed an adhesive capsulitis.  She is finished a course of physical therapy and does have a home exercise program but still lacks probably 20 to 25 degrees of overhead motion and internal rotation.  Long discussion regarding options including cortisone injection, return to physical therapy or even close manipulation.  She would like to continue trying to work it out on her own.  Like to see her back in 3 to 4 weeks.  Otherwise she is done very well and does not have the pain she had preoperatively  Follow-Up Instructions: Return in about 1 month (around 03/28/2022).   Orders:  No orders of the defined types were placed in this encounter.  No orders of the defined types were placed in this encounter.     Procedures: No procedures performed   Clinical Data: No additional findings.   Subjective: Chief Complaint  Patient presents with   Right Shoulder - Routine Post Op   Patient presents today s/p right shoulder scope preformed on 11/24/2021. Patient states that she feels like ROM is gradually getting better. She does mention that she has been having continued muscle spasms in her right shoulder. She has been taking a muscle relaxer to help manage this. Patient has completed her physical therapy sessions around 1-2 weeks ago.  Review of Systems   Objective: Vital Signs: There were no vitals taken for this visit.  Physical  Exam Constitutional:      Appearance: She is well-developed.  Eyes:     Pupils: Pupils are equal, round, and reactive to light.  Pulmonary:     Effort: Pulmonary effort is normal.  Skin:    General: Skin is warm and dry.  Neurological:     Mental Status: She is alert and oriented to person, place, and time.  Psychiatric:        Behavior: Behavior normal.     Ortho Exam awake alert and oriented x3.  Comfortable sitting able to actively place left arm within about 25 to 30 degrees of full overhead motion and does have some limitation of internal rotation consistent with the adhesive capsulitis.  Negative impingement to can testing.  No localized areas of tenderness.  Skin intact.  Good strength  Specialty Comments:  No specialty comments available.  Imaging: No results found.   PMFS History: Patient Active Problem List   Diagnosis Date Noted   Complete tear of right rotator cuff 10/29/2021   Pain in right shoulder 09/24/2021   Anterior knee pain, right 10/09/2020   Overweight with body mass index (BMI) 25.0-29.9 10/20/2017   Endometriosis 10/20/2017   Hypertensive disorder 10/20/2017   Cyst of ovary 08/02/1996   Abnormal cervical Papanicolaou smear 08/03/1983   Past Medical History:  Diagnosis Date   Anemia    History  of   Arthritis    Right Shoulder   History of TIA (transient ischemic attack)    Hypertension    Migraines    History of   PONV (postoperative nausea and vomiting)     History reviewed. No pertinent family history.  Past Surgical History:  Procedure Laterality Date   ABDOMINAL HYSTERECTOMY     CESAREAN SECTION     COLONOSCOPY     NECK SURGERY     plate in neck   SHOULDER ARTHROSCOPY WITH OPEN ROTATOR CUFF REPAIR AND DISTAL CLAVICLE ACROMINECTOMY Right 11/24/2021   Procedure: RIGHT SHOULDER ARTHROSCOPIC SUBACROMIAL DECOMPRESSION WITH OPEN ROTATOR CUFF REPAIR AND DISTAL CLAVICLE ACROMINECTOMY;  Surgeon: Valeria Batman, MD;  Location: [facility];   Service: Orthopedics;  Laterality: Right;   UPPER GI ENDOSCOPY     Social History   Occupational History   Not on file  Tobacco Use   Smoking status: Never   Smokeless tobacco: Never  Vaping Use   Vaping Use: Never used  Substance and Sexual Activity   Alcohol use: No   Drug use: No   Sexual activity: Yes    Birth control/protection: Surgical

## 2022-04-01 ENCOUNTER — Encounter: Payer: Self-pay | Admitting: Physician Assistant

## 2022-04-01 ENCOUNTER — Ambulatory Visit (INDEPENDENT_AMBULATORY_CARE_PROVIDER_SITE_OTHER): Payer: BC Managed Care – PPO | Admitting: Physician Assistant

## 2022-04-01 DIAGNOSIS — M25511 Pain in right shoulder: Secondary | ICD-10-CM

## 2022-04-01 DIAGNOSIS — M75121 Complete rotator cuff tear or rupture of right shoulder, not specified as traumatic: Secondary | ICD-10-CM

## 2022-04-01 NOTE — Progress Notes (Signed)
Office Visit Note   Patient: Tammy Aguirre           Date of Birth: 12/03/1960           MRN: [id number] Visit Date: 04/01/2022              Requested by: Renford Dills, MD [address] [place],  [place] [address] PCP: Renford Dills, MD  Chief Complaint  Patient presents with   Right Shoulder - Follow-up      HPI: Patient is a pleasant 61 year old woman who is here for follow-up.  She is 4 months status post right rotator cuff repair.  She did develop some adhesive capsulitis.  She has been doing exercises on her own and feels she is improving.  She still feels some tightness.  She still has some achiness in the shoulder.  Assessment & Plan: Visit Diagnoses:  1. Acute pain of right shoulder   2. Complete tear of right rotator cuff, unspecified whether traumatic     Plan: Her movement has significantly improved.  With forward flexion she would within 5 degrees of her left arm.  She also has abduction to 90 degrees.  She can easily internally rotate behind her back.  She will follow-up for 1 final time in 1 month.  She asked about returning to exercise activities.  I did tell her not to do any sudden acceleration deceleration activities.  She asked about push-ups I would defer this for now since she is just getting good motion back.  Follow-Up Instructions: Return in about 1 month (around 05/01/2022).   Ortho Exam  Patient is alert, oriented, no adenopathy, well-dressed, normal affect, normal respiratory effort. Examination of her right shoulder well-healed surgical incision.  Sensation is intact.  She has forward elevation to 130 degrees.  She has internal rotation behind her back to her bra line.  She has abduction to 90 degrees.  Strength is improving  Imaging: No results found. No images are attached to the encounter.  Labs: No results found for: "HGBA1C", "ESRSEDRATE", "CRP", "LABURIC", "REPTSTATUS", "GRAMSTAIN", "CULT", "LABORGA"   No results found  for: "ALBUMIN", "PREALBUMIN", "CBC"  No results found for: "MG" No results found for: "VD25OH"  No results found for: "PREALBUMIN"    Latest Ref Rng & Units 11/20/2021    3:55 PM 09/13/2011    4:40 PM 08/08/2009    9:00 AM  CBC EXTENDED  WBC 4.0 - 10.5 K/uL 5.7  5.8  4.0   RBC 3.87 - 5.11 MIL/uL 4.03  3.95  4.13   Hemoglobin 12.0 - 15.0 g/dL 44.0  34.7  42.5   HCT 36.0 - 46.0 % 36.1  33.8  36.6   Platelets 150 - 400 K/uL 201  190  175   NEUT# 1.7 - 7.7 K/uL  3.7    Lymph# 0.7 - 4.0 K/uL  1.5       There is no height or weight on file to calculate BMI.  Orders:  No orders of the defined types were placed in this encounter.  No orders of the defined types were placed in this encounter.    Procedures: No procedures performed  Clinical Data: No additional findings.  ROS:  All other systems negative, except as noted in the HPI. Review of Systems  Objective: Vital Signs: There were no vitals taken for this visit.  Specialty Comments:  No specialty comments available.  PMFS History: Patient Active Problem List   Diagnosis Date Noted  Complete tear of right rotator cuff 10/29/2021   Pain in right shoulder 09/24/2021   Anterior knee pain, right 10/09/2020   Overweight with body mass index (BMI) 25.0-29.9 10/20/2017   Endometriosis 10/20/2017   Hypertensive disorder 10/20/2017   Cyst of ovary 08/02/1996   Abnormal cervical Papanicolaou smear 08/03/1983   Past Medical History:  Diagnosis Date   Anemia    History of   Arthritis    Right Shoulder   History of TIA (transient ischemic attack)    Hypertension    Migraines    History of   PONV (postoperative nausea and vomiting)     History reviewed. No pertinent family history.  Past Surgical History:  Procedure Laterality Date   ABDOMINAL HYSTERECTOMY     CESAREAN SECTION     COLONOSCOPY     NECK SURGERY     plate in neck   SHOULDER ARTHROSCOPY WITH OPEN ROTATOR CUFF REPAIR AND DISTAL CLAVICLE  ACROMINECTOMY Right 11/24/2021   Procedure: RIGHT SHOULDER ARTHROSCOPIC SUBACROMIAL DECOMPRESSION WITH OPEN ROTATOR CUFF REPAIR AND DISTAL CLAVICLE ACROMINECTOMY;  Surgeon: Valeria Batman, MD;  Location: [facility];  Service: Orthopedics;  Laterality: Right;   UPPER GI ENDOSCOPY     Social History   Occupational History   Not on file  Tobacco Use   Smoking status: Never   Smokeless tobacco: Never  Vaping Use   Vaping Use: Never used  Substance and Sexual Activity   Alcohol use: No   Drug use: No   Sexual activity: Yes    Birth control/protection: Surgical

## 2022-04-28 ENCOUNTER — Encounter: Payer: Self-pay | Admitting: Orthopaedic Surgery

## 2022-04-28 ENCOUNTER — Ambulatory Visit (INDEPENDENT_AMBULATORY_CARE_PROVIDER_SITE_OTHER): Payer: BC Managed Care – PPO | Admitting: Orthopaedic Surgery

## 2022-04-28 DIAGNOSIS — M75121 Complete rotator cuff tear or rupture of right shoulder, not specified as traumatic: Secondary | ICD-10-CM | POA: Diagnosis not present

## 2022-04-28 NOTE — Progress Notes (Signed)
Office Visit Note   Patient: Tammy Aguirre           Date of Birth: 1961/05/05           MRN: [id number] Visit Date: 04/28/2022              Requested by: Seward Carol, MD [address] [place],  [place] [address] PCP: Seward Carol, MD   Assessment & Plan: Visit Diagnoses:  1. Complete tear of right rotator cuff, unspecified whether traumatic     Plan: [name] is a pleasant 61 year old woman who is now 5 months status post right shoulder arthroscopy rotator cuff repair.  She did have some difficulties with adhesive capsulitis postoperatively.  She has been working hard on her own and feels she is improving.  She feels better than prior to surgery.  She still gets woken up occasionally with pain in her shoulder.  Her exam today was significantly better than previously she is just a little tight and is lacking about 5 degrees of forward elevation.  She can internally rotate behind her back to her bra line.  She has just a little tightness with external rotation her strength is improving.  We have encouraged her to continue to work on motion and strengthening.  She knows it will be almost a year before she achieves her maximum improvement.  She asked about weight training and we told her she could gradually get into weight training but should increase the weight slowly especially with overhead activities  Follow-Up Instructions: As needed  Orders:  No orders of the defined types were placed in this encounter.  No orders of the defined types were placed in this encounter.     Procedures: No procedures performed   Clinical Data: No additional findings.   Subjective: Chief Complaint  Patient presents with   Right Shoulder - Pain, Follow-up    S/p rotator cuff tear repair  Patient presents today for a one month follow up on her right shoulder. She is now 5 months out from a rotator cuff tear repair. She said that she is doing well overall. She does still  experience some soreness.     Review of Systems   Objective: Vital Signs: There were no vitals taken for this visit.  Physical Exam Vitals reviewed.  Constitutional:      Appearance: Normal appearance.  Pulmonary:     Effort: Pulmonary effort is normal.  Skin:    General: Skin is warm and dry.  Neurological:     Mental Status: She is alert.     Ortho Exam Right shoulder she has well-healed surgical incisions no redness no swelling.  She is minimally tender to palpation she has active forward elevation to within 5 degrees of her unaffected side.  She has internal rotation to her almost her bra line.  She has just a little bit of tightness with external rotation she has good strength with resisted abduction and external rotation.  Sensation is intact good grip strength specialty Comments:  No specialty comments available.  Imaging: No results found.   PMFS History: Patient Active Problem List   Diagnosis Date Noted   Complete tear of right rotator cuff 10/29/2021   Pain in right shoulder 09/24/2021   Anterior knee pain, right 10/09/2020   Overweight with body mass index (BMI) 25.0-29.9 10/20/2017   Endometriosis 10/20/2017   Hypertensive disorder 10/20/2017   Cyst of ovary 08/02/1996   Abnormal cervical Papanicolaou smear 08/03/1983  Past Medical History:  Diagnosis Date   Anemia    History of   Arthritis    Right Shoulder   History of TIA (transient ischemic attack)    Hypertension    Migraines    History of   PONV (postoperative nausea and vomiting)     History reviewed. No pertinent family history.  Past Surgical History:  Procedure Laterality Date   ABDOMINAL HYSTERECTOMY     CESAREAN SECTION     COLONOSCOPY     NECK SURGERY     plate in neck   SHOULDER ARTHROSCOPY WITH OPEN ROTATOR CUFF REPAIR AND DISTAL CLAVICLE ACROMINECTOMY Right 11/24/2021   Procedure: RIGHT SHOULDER ARTHROSCOPIC SUBACROMIAL DECOMPRESSION WITH OPEN ROTATOR CUFF REPAIR AND DISTAL  CLAVICLE ACROMINECTOMY;  Surgeon: Valeria Batman, MD;  Location: [facility];  Service: Orthopedics;  Laterality: Right;   UPPER GI ENDOSCOPY     Social History   Occupational History   Not on file  Tobacco Use   Smoking status: Never   Smokeless tobacco: Never  Vaping Use   Vaping Use: Never used  Substance and Sexual Activity   Alcohol use: No   Drug use: No   Sexual activity: Yes    Birth control/protection: Surgical

## 2022-04-29 ENCOUNTER — Ambulatory Visit: Payer: BC Managed Care – PPO | Admitting: Orthopaedic Surgery

## 2022-05-20 IMAGING — MR MR SHOULDER*R* W/O CM
4 of 5 series · 19 of 40 positions shown · non-contrast
Comparison: Right shoulder radiographs 09/04/2021

CLINICAL DATA: Right shoulder pain. Rotator cuff disorder
suspected. Pain and weakness. Limited range of motion.

EXAM:
MRI OF THE RIGHT SHOULDER WITHOUT CONTRAST
TECHNIQUE: Multiplanar, multisequence MR imaging of the shoulder was performed.
No intravenous contrast was administered.

[Series 6: T2 fat-sat · axial · right · 3.0mm · 0.47mm/px · z∈[-45,+40]mm · 6 of 27 slices shown (1 of 3)]
[im 1/27]
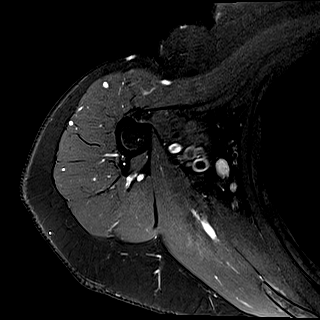
[im 4/27]
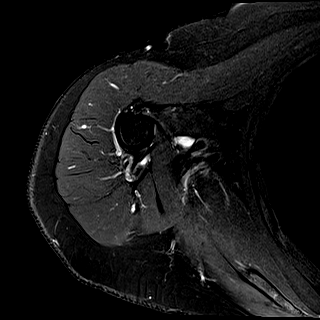
[im 7/27]
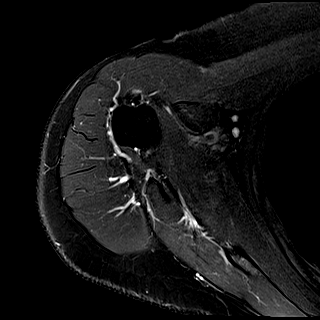
[im 10/27]
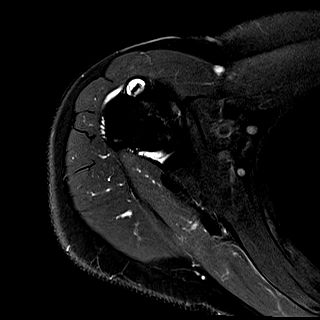
[im 14/27]
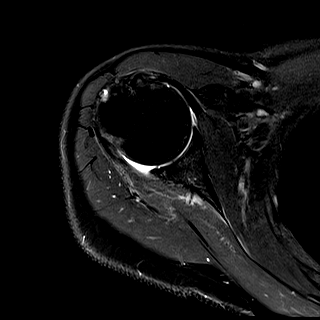
[im 23/27]
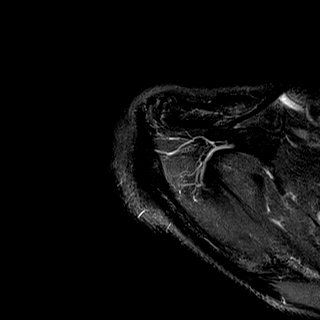

[Series 7: T2 fat-sat · oblique · right · 4.0mm · 0.22mm/px · 3 of 23 slices shown (2 of 3)]
[im 4/23]
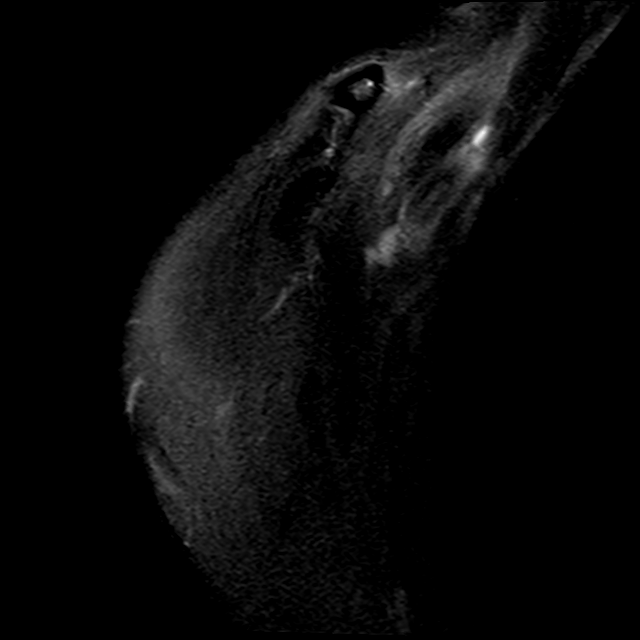
[im 13/23]
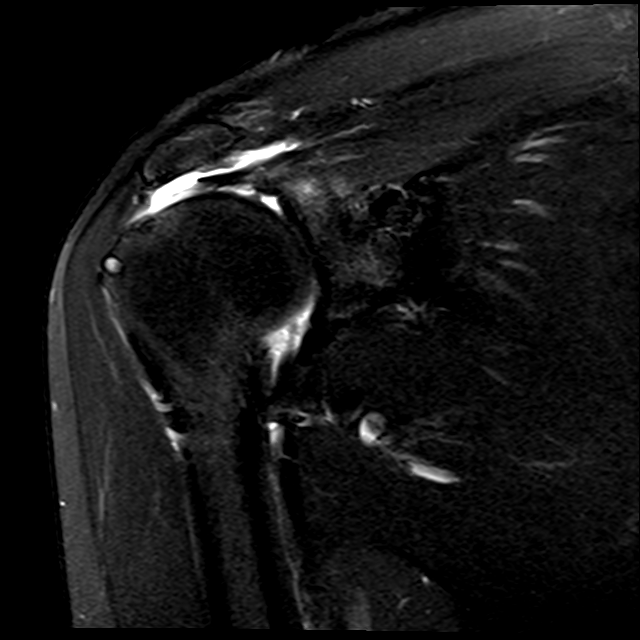
[im 19/23]
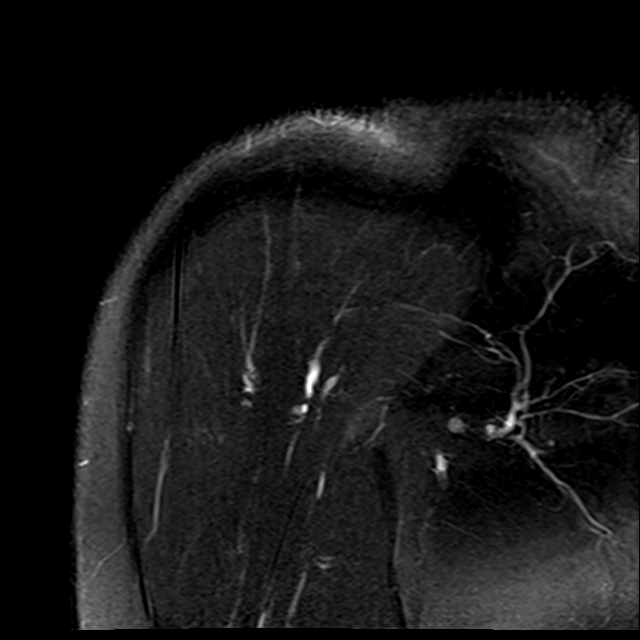

[Series 8: PD · oblique · right · 4.0mm · 0.22mm/px · 7 of 21 slices shown]
[im 1/21]
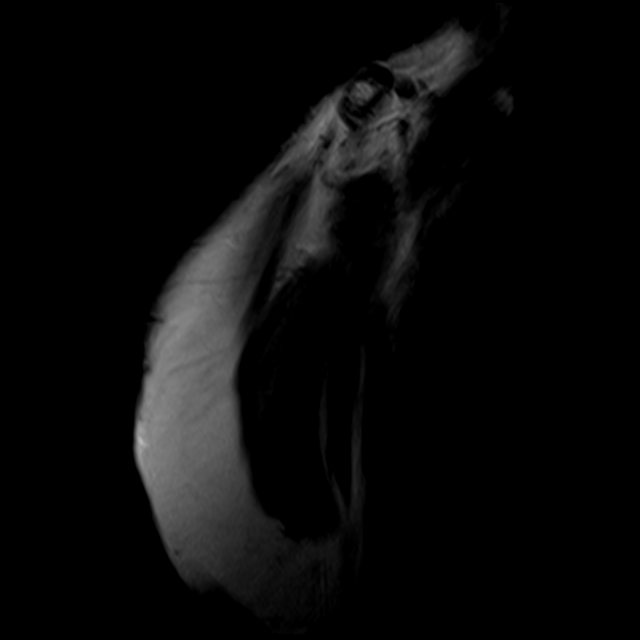
[im 4/21]
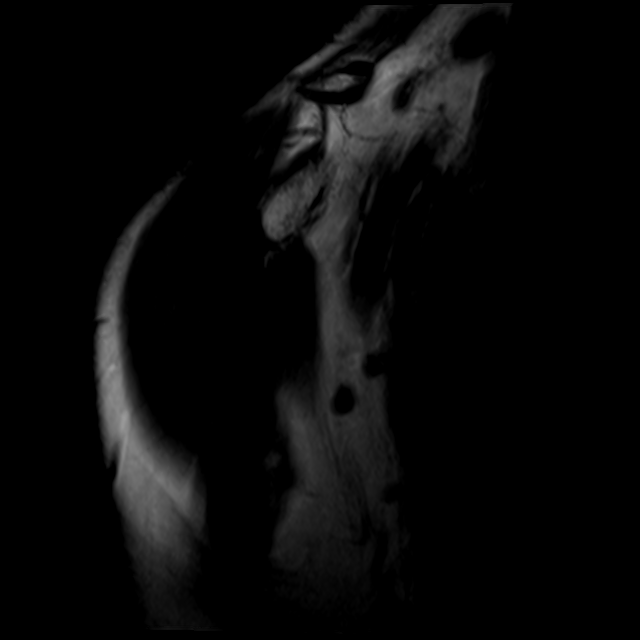
[im 7/21]
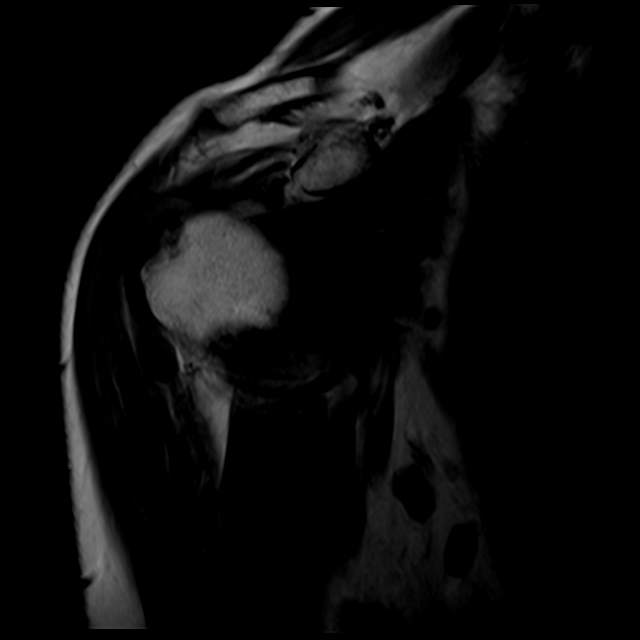
[im 11/21]
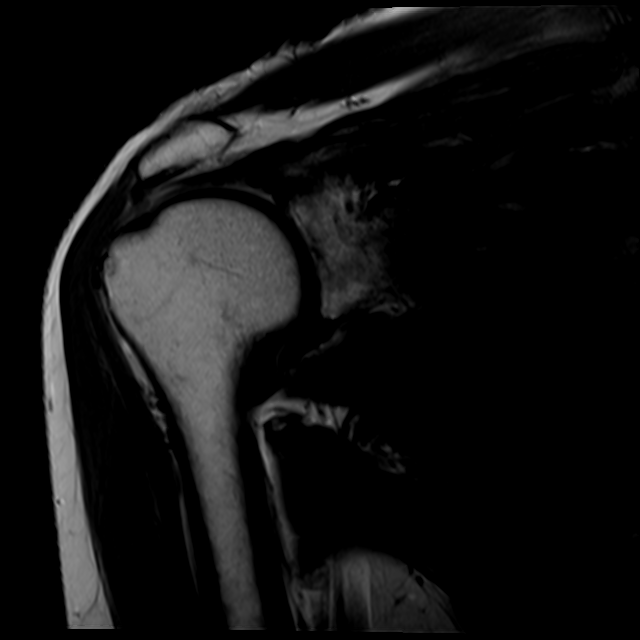
[im 14/21]
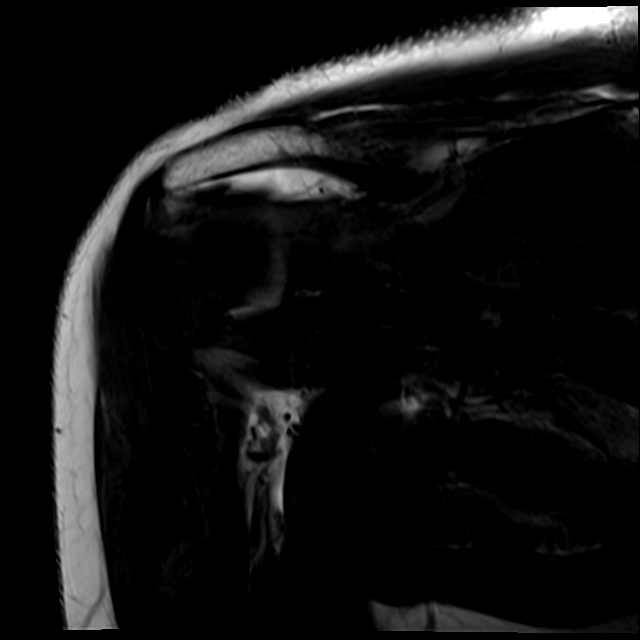
[im 17/21]
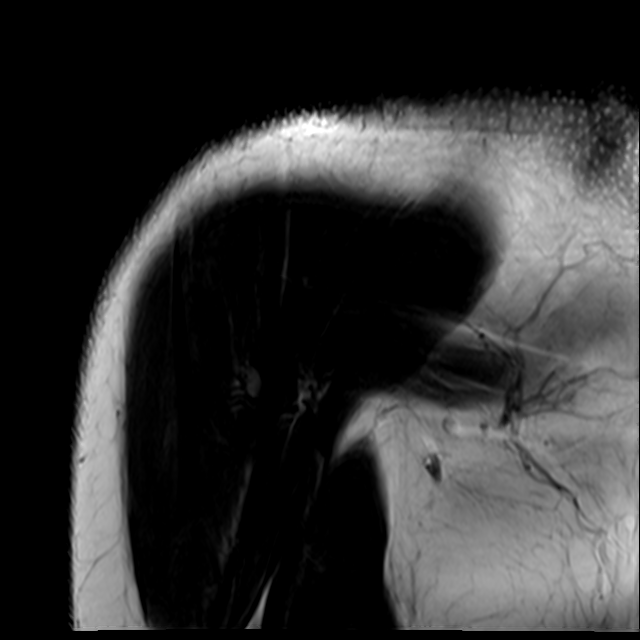
[im 21/21]
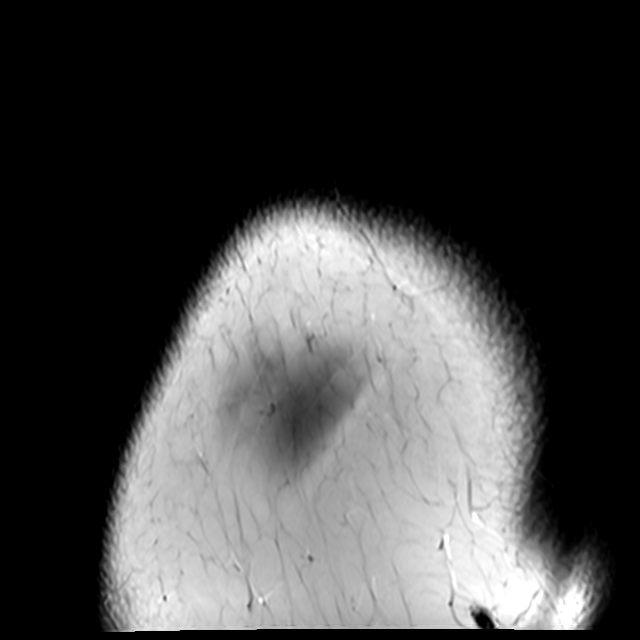

[Series 9: T2 fat-sat · oblique · right · 4.0mm · 0.44mm/px · 3 of 24 slices shown (3 of 3)]
[im 4/24]
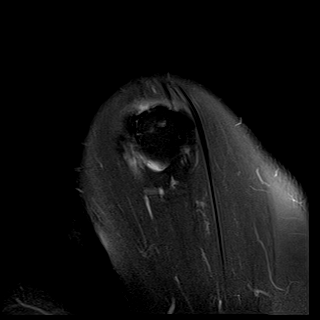
[im 14/24]
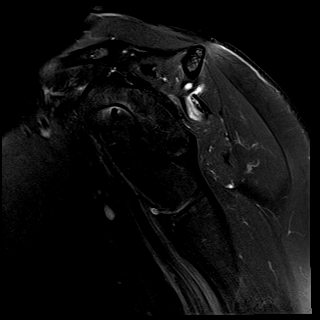
[im 20/24]
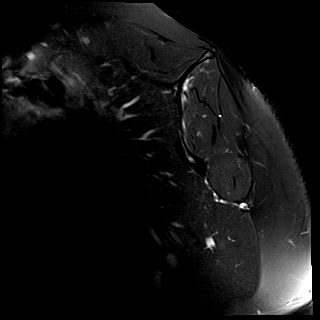

[19 of 40 positions shown; findings below may reference images not displayed]

FINDINGS: Rotator cuff: There is a fluid bright full-thickness tear of the
posterior supraspinatus tendon footprint measuring up to 10 mm in AP
dimension (sagittal image 6) and with up to 18 mm tendon retraction
(coronal image 13). There is moderate to high-grade thinning of the
partially intact mid and anterior supraspinatus tendon fibers.
Moderate intermediate and fluid bright signal within the deep aspect
of the infraspinatus musculotendinous junction (sagittal images 8
through 15) extending into the undersurface of the mid and anterior
infraspinatus tendon fibers (sagittal image 7), likely small
partial-thickness interstitial tears without infraspinatus tendon
retraction. The subscapularis and teres minor are intact.

Muscles:  Mild anterior supraspinatus muscle atrophy.

Biceps long head: The intra-articular long head of the biceps tendon
is intact.

Acromioclavicular Joint: There are mild degenerative changes of the
acromioclavicular joint including joint space narrowing, subchondral
marrow edema, and peripheral osteophytosis. Type II acromion.
Moderate distal anterior, inferior, lateral acromial spurring
(sagittal series 10, image 7).

Mild glenohumeral joint effusion extending into the
subacromial/subdeltoid bursa.

Glenohumeral joint: Full-thickness cartilage loss within the a mid
glenoid (coronal image 12). Moderate thinning of the humeral head
cartilage.

Labrum: Grossly intact, but evaluation is limited by lack of
intraarticular fluid.

Bones:  No acute fracture.

Other: None.
IMPRESSION: :
IMPRESSION: 1. Full-thickness tear of the posterior supraspinatus tendon
footprint measuring up to 10 mm in AP dimension. Moderate to
high-grade thinning of the articular side of the more anterior
supraspinatus tendon fibers, likely a chronic partial-thickness
articular sided tear.
2. Small partial-thickness interstitial tears of the
musculotendinous junction and tendon footprint of the mid and
anterior infraspinatus.
3. Mild anterior supraspinatus muscle atrophy.
4. Mild degenerative changes of the acromioclavicular joint.
Moderate distal anterior, inferior, lateral acromial spurring.

## 2022-06-23 DIAGNOSIS — Z1231 Encounter for screening mammogram for malignant neoplasm of breast: Secondary | ICD-10-CM | POA: Diagnosis not present

## 2022-06-23 DIAGNOSIS — Z6824 Body mass index (BMI) 24.0-24.9, adult: Secondary | ICD-10-CM | POA: Diagnosis not present

## 2022-06-23 DIAGNOSIS — R1900 Intra-abdominal and pelvic swelling, mass and lump, unspecified site: Secondary | ICD-10-CM | POA: Diagnosis not present

## 2022-06-23 DIAGNOSIS — Z01419 Encounter for gynecological examination (general) (routine) without abnormal findings: Secondary | ICD-10-CM | POA: Diagnosis not present

## 2022-06-23 DIAGNOSIS — Z90711 Acquired absence of uterus with remaining cervical stump: Secondary | ICD-10-CM | POA: Diagnosis not present

## 2022-11-29 DIAGNOSIS — L219 Seborrheic dermatitis, unspecified: Secondary | ICD-10-CM | POA: Diagnosis not present

## 2022-11-29 DIAGNOSIS — L658 Other specified nonscarring hair loss: Secondary | ICD-10-CM | POA: Diagnosis not present

## 2022-12-01 ENCOUNTER — Other Ambulatory Visit (INDEPENDENT_AMBULATORY_CARE_PROVIDER_SITE_OTHER): Payer: BC Managed Care – PPO

## 2022-12-01 ENCOUNTER — Ambulatory Visit: Payer: BC Managed Care – PPO | Admitting: Orthopaedic Surgery

## 2022-12-01 DIAGNOSIS — M25562 Pain in left knee: Secondary | ICD-10-CM

## 2022-12-01 NOTE — Progress Notes (Signed)
The patient is a 62 year old female that I am seeing for the first time.  She has been an established patient of the office though.  She comes in with 3 weeks of knee pain and instability type symptoms.  She felt a pop in her knee when she was getting into bed.  She said the knee feels tight at times and she has more discomfort with going up and down stairs.  She is actually starting working out at [employer] this coming Saturday.  She is never had surgery on her knees and had any type of injection or injuries to her left knee.  On exam her left knee shows no effusion.  Both knees hyperextend.  Her left knee is cool.  He has full range of motion and is ligamentously stable.  There is slight irritation of the medial joint line and slight irritation when I rotate the medial tibia on the femur.  2 views left knee show no acute findings.  There is no effusion.  The joint spaces well-maintained.  Since she is going to start out with a workout routine coming this week and can do this 2-3 times a week, I gave her a note to give them to have her work on quad strengthening exercises.  She can certainly try an over-the-counter knee sleeve.  I would like to see her back in 2 weeks to see how she is doing from a mechanical standpoint of her need to then consider knee treatment options based on her symptoms and how she is doing.  She agrees with this treatment plan.  No x-rays are needed in 2 weeks.

## 2022-12-06 ENCOUNTER — Ambulatory Visit: Payer: BC Managed Care – PPO | Admitting: Orthopaedic Surgery

## 2023-01-27 ENCOUNTER — Encounter: Payer: BC Managed Care – PPO | Admitting: Rehabilitative and Restorative Service Providers"

## 2023-02-09 DIAGNOSIS — M79673 Pain in unspecified foot: Secondary | ICD-10-CM | POA: Diagnosis not present

## 2023-02-09 DIAGNOSIS — M7989 Other specified soft tissue disorders: Secondary | ICD-10-CM | POA: Diagnosis not present

## 2023-02-18 DIAGNOSIS — R600 Localized edema: Secondary | ICD-10-CM | POA: Diagnosis not present

## 2023-02-18 DIAGNOSIS — M7989 Other specified soft tissue disorders: Secondary | ICD-10-CM | POA: Diagnosis not present

## 2023-07-05 DIAGNOSIS — Z01419 Encounter for gynecological examination (general) (routine) without abnormal findings: Secondary | ICD-10-CM | POA: Diagnosis not present

## 2023-07-05 DIAGNOSIS — Z1339 Encounter for screening examination for other mental health and behavioral disorders: Secondary | ICD-10-CM | POA: Diagnosis not present

## 2023-07-06 DIAGNOSIS — Z1231 Encounter for screening mammogram for malignant neoplasm of breast: Secondary | ICD-10-CM | POA: Diagnosis not present

## 2023-09-19 DIAGNOSIS — G4484 Primary exertional headache: Secondary | ICD-10-CM | POA: Diagnosis not present

## 2023-09-19 DIAGNOSIS — Z0000 Encounter for general adult medical examination without abnormal findings: Secondary | ICD-10-CM | POA: Diagnosis not present

## 2023-09-19 DIAGNOSIS — I10 Essential (primary) hypertension: Secondary | ICD-10-CM | POA: Diagnosis not present

## 2023-09-20 ENCOUNTER — Other Ambulatory Visit (HOSPITAL_COMMUNITY): Payer: Self-pay | Admitting: Internal Medicine

## 2023-09-20 ENCOUNTER — Ambulatory Visit (HOSPITAL_COMMUNITY)
Admission: RE | Admit: 2023-09-20 | Discharge: 2023-09-20 | Disposition: A | Discharge status: Home / Self Care | Payer: BC Managed Care – PPO | Source: Ambulatory Visit | Attending: Internal Medicine | Admitting: Internal Medicine

## 2023-09-20 DIAGNOSIS — G4484 Primary exertional headache: Secondary | ICD-10-CM | POA: Diagnosis not present

## 2023-09-20 DIAGNOSIS — I671 Cerebral aneurysm, nonruptured: Secondary | ICD-10-CM | POA: Diagnosis not present

## 2023-09-21 ENCOUNTER — Ambulatory Visit (HOSPITAL_COMMUNITY)
Admission: RE | Admit: 2023-09-21 | Discharge: 2023-09-21 | Disposition: A | Discharge status: Home / Self Care | Payer: BC Managed Care – PPO | Source: Ambulatory Visit | Attending: Internal Medicine | Admitting: Internal Medicine

## 2023-09-21 ENCOUNTER — Other Ambulatory Visit (HOSPITAL_COMMUNITY): Payer: Self-pay | Admitting: Internal Medicine

## 2023-09-21 DIAGNOSIS — I671 Cerebral aneurysm, nonruptured: Secondary | ICD-10-CM | POA: Diagnosis not present

## 2023-09-21 DIAGNOSIS — R519 Headache, unspecified: Secondary | ICD-10-CM | POA: Diagnosis not present

## 2023-09-21 MED ORDER — SODIUM CHLORIDE (PF) 0.9 % IJ SOLN
INTRAMUSCULAR | Status: AC
  Filled 2023-09-21: qty 50

## 2023-09-21 MED ORDER — IOHEXOL 350 MG/ML SOLN
75.0000 mL | Freq: Once | INTRAVENOUS | Status: AC | PRN
  Administered 2023-09-21: 75 mL via INTRAVENOUS

## 2023-09-26 ENCOUNTER — Telehealth (HOSPITAL_COMMUNITY): Payer: Self-pay

## 2023-09-26 ENCOUNTER — Other Ambulatory Visit (HOSPITAL_COMMUNITY): Payer: Self-pay | Admitting: Interventional Radiology

## 2023-09-26 DIAGNOSIS — I671 Cerebral aneurysm, nonruptured: Secondary | ICD-10-CM

## 2023-09-26 NOTE — Telephone Encounter (Signed)
-----   Message from Karle Barr sent at 09/23/2023 12:55 PM EST ----- New pt referral from Dr. Renford Dills to D for 3 aneurysms. Dev wants to see early next week.

## 2023-09-28 ENCOUNTER — Ambulatory Visit (HOSPITAL_COMMUNITY)
Admission: RE | Admit: 2023-09-28 | Discharge: 2023-09-28 | Disposition: A | Discharge status: Home / Self Care | Payer: BC Managed Care – PPO | Source: Ambulatory Visit | Attending: Interventional Radiology | Admitting: Interventional Radiology

## 2023-09-28 DIAGNOSIS — R519 Headache, unspecified: Secondary | ICD-10-CM | POA: Diagnosis not present

## 2023-09-28 DIAGNOSIS — I671 Cerebral aneurysm, nonruptured: Secondary | ICD-10-CM

## 2023-09-30 ENCOUNTER — Telehealth (HOSPITAL_COMMUNITY): Payer: Self-pay | Admitting: Radiology

## 2023-09-30 HISTORY — PX: IR RADIOLOGIST EVAL & MGMT: IMG5224

## 2023-09-30 NOTE — Telephone Encounter (Signed)
 Patient presented to Radiology Registration requesting update on plavix prescription.  Spoke to Dr. Julieanne Cotton who recommends the patient start plavix 7 days before her procedure with a loading dose of 300 mg plavix the first day followed by 75 mg daily for 6 days and 81 mg of ASA starting 7 days prior. Surgery not scheduled at this time. Message sent to schedulers for clarification on procedure date. Called patient and made her aware of the situation. Patient verbalized understanding and will wait for further instruction.

## 2023-10-04 ENCOUNTER — Other Ambulatory Visit (HOSPITAL_COMMUNITY): Payer: Self-pay | Admitting: Interventional Radiology

## 2023-10-04 ENCOUNTER — Telehealth (HOSPITAL_COMMUNITY): Payer: Self-pay | Admitting: Student

## 2023-10-04 DIAGNOSIS — I671 Cerebral aneurysm, nonruptured: Secondary | ICD-10-CM

## 2023-10-04 MED ORDER — CLOPIDOGREL BISULFATE 75 MG PO TABS: 75.0000 mg | ORAL_TABLET | Freq: Every day | ORAL | 2 refills | Status: DC

## 2023-10-04 NOTE — Telephone Encounter (Signed)
 Patient scheduled for NIR procedure 10/17/23. Plavix e-prescribed to patient's pharmacy.   Alwyn Ren, AGACNP-BC 10/04/2023, 12:13 PM

## 2023-10-13 ENCOUNTER — Other Ambulatory Visit: Payer: Self-pay | Admitting: Student

## 2023-10-13 ENCOUNTER — Other Ambulatory Visit (HOSPITAL_COMMUNITY): Payer: Self-pay | Admitting: Radiology

## 2023-10-13 ENCOUNTER — Other Ambulatory Visit (HOSPITAL_COMMUNITY)
Admission: RE | Admit: 2023-10-13 | Discharge: 2023-10-13 | Disposition: A | Discharge status: Home / Self Care | Attending: Interventional Radiology | Admitting: Interventional Radiology

## 2023-10-13 DIAGNOSIS — I671 Cerebral aneurysm, nonruptured: Secondary | ICD-10-CM

## 2023-10-13 DIAGNOSIS — Z01818 Encounter for other preprocedural examination: Secondary | ICD-10-CM

## 2023-10-13 LAB — PLATELET INHIBITION P2Y12: Platelet Function  P2Y12: 119 [PRU] — ABNORMAL LOW (ref 182–335)

## 2023-10-14 ENCOUNTER — Encounter (HOSPITAL_COMMUNITY): Payer: Self-pay | Admitting: Interventional Radiology

## 2023-10-14 ENCOUNTER — Other Ambulatory Visit: Payer: Self-pay

## 2023-10-14 ENCOUNTER — Other Ambulatory Visit: Payer: Self-pay | Admitting: Radiology

## 2023-10-14 NOTE — Anesthesia Preprocedure Evaluation (Addendum)
 Anesthesia Evaluation  Patient identified by MRN, date of birth, ID band Patient awake    Reviewed: Allergy & Precautions, H&P , NPO status , Patient's Chart, lab work & pertinent test results  History of Anesthesia Complications (+) PONV and history of anesthetic complications  Airway Mallampati: II   Neck ROM: full    Dental   Pulmonary    breath sounds clear to auscultation       Cardiovascular hypertension,  Rhythm:regular Rate:Normal     Neuro/Psych  Headaches Cerebral aneurysm    GI/Hepatic   Endo/Other    Renal/GU      Musculoskeletal  (+) Arthritis ,    Abdominal   Peds  Hematology   Anesthesia Other Findings   Reproductive/Obstetrics                             Anesthesia Physical Anesthesia Plan  ASA: 3  Anesthesia Plan: General   Post-op Pain Management:    Induction: Intravenous  PONV Risk Score and Plan: 4 or greater and Ondansetron, Dexamethasone, Midazolam and Treatment may vary due to age or medical condition  Airway Management Planned: Oral ETT  Additional Equipment: Arterial line  Intra-op Plan:   Post-operative Plan: Extubation in OR  Informed Consent: I have reviewed the patients History and Physical, chart, labs and discussed the procedure including the risks, benefits and alternatives for the proposed anesthesia with the patient or authorized representative who has indicated his/her understanding and acceptance.     Dental advisory given  Plan Discussed with: CRNA, Anesthesiologist and Surgeon  Anesthesia Plan Comments: (PAT note written 10/14/2023 by Shonna Chock, PA-C.  )       Anesthesia Quick Evaluation

## 2023-10-14 NOTE — Progress Notes (Signed)
 Anesthesia Chart Review: Tammy Aguirre  Case: [id number] Date/Time: 10/17/23 0730   Procedure: RADIOLOGY WITH ANESTHESIA - Angiogram with intent to treat   Anesthesia type: General   Diagnosis: Cerebral aneurysm, nonruptured [I67.1]   Pre-op diagnosis: Brain aneurysm   [facility]   Surgeons: Julieanne Cotton, MD       DISCUSSION: Patient is a 63 year old female scheduled for the above procedure.  History includes never smoker, postoperative N/nV, HTN, anemia, migraines, TIA (2002?), cerebral aneurysms (one at right MCA bifurcation region, one at right PCA region, one at left PCA), spinal surgery (C4-5 ACDF 08/13/09).  On ASA and Plavix for procedure per IR. Anesthesia team to evaluate on the day of procedure.   VS: Ht 5\' 1"  (1.549 m)   Wt 58.1 kg   BMI 24.19 kg/m  BP Readings from Last 3 Encounters:  11/24/21 116/76  11/20/21 121/78  10/20/17 121/82   Pulse Readings from Last 3 Encounters:  11/24/21 74  11/20/21 64  10/20/17 96    PROVIDERS: Renford Dills, MD is PCP    LABS: For day of surgery. P2y12 on 10/13/23 was 119 per IR.    IMAGES: CTA Head 09/21/23: IMPRESSION: 1. 3 x 5 mm right posterior communicating artery aneurysm. 2. 3 mm left posterior communicating artery aneurysm. 3. 3-5 mm lobular aneurysm at the right MCA bifurcation. 4. No intracranial large vessel occlusion or correctable stenosis. 5. Neuro endovascular consultation recommended for consideration of treatment given that the patient is symptomatic.   MRA Head 09/20/23: IMPRESSION: 1. 3-4 mm aneurysm at the right MCA bifurcation. CT angiography or catheter angiography would be recommended for more accurate characterization. 2. No other abnormality. 3. Report will be called upon office opening.    EKG: For day of procedure as indicated.  EKG 11/20/21: Normal sinus rhythm Minimal voltage criteria for LVH, may be normal variant ( R in aVL ) Cannot rule out  Anterior infarct , age undetermined Abnormal ECG When compared with ECG of 13-Sep-2011 16:31, Since last tracing rate faster Confirmed by Lewayne Bunting [phone]) on 11/20/2021 10:20:36 PM   CV: No recent cardiac studies.   She had an prior echo > 3 years ago.  Nuclear stress test 04/27/99 ([facility]/PACS): CONCLUSION: 1. Normal myocardial perfusion at rest and peak stress without evidence of myocardial ischemia.     Past Medical History:  Diagnosis Date   Anemia    History of   Arthritis    Right Shoulder   History of TIA (transient ischemic attack)    Hypertension    Migraines    History of   PONV (postoperative nausea and vomiting)     Past Surgical History:  Procedure Laterality Date   ABDOMINAL HYSTERECTOMY     CESAREAN SECTION     x 1   COLONOSCOPY     IR RADIOLOGIST EVAL & MGMT  09/30/2023   MRI   NECK SURGERY     plate in neck   SHOULDER ARTHROSCOPY WITH OPEN ROTATOR CUFF REPAIR AND DISTAL CLAVICLE ACROMINECTOMY Right 11/24/2021   Procedure: RIGHT SHOULDER ARTHROSCOPIC SUBACROMIAL DECOMPRESSION WITH OPEN ROTATOR CUFF REPAIR AND DISTAL CLAVICLE ACROMINECTOMY;  Surgeon: Valeria Batman, MD;  Location: [facility];  Service: Orthopedics;  Laterality: Right;   UPPER GI ENDOSCOPY      MEDICATIONS: No current facility-administered medications for this encounter.    aspirin EC 81 MG tablet   clopidogrel (PLAVIX) 75 MG tablet   fexofenadine (ALLEGRA) 180 MG tablet  finasteride (PROSCAR) 5 MG tablet   irbesartan-hydrochlorothiazide (AVALIDE) 150-12.5 MG tablet   OVER THE COUNTER MEDICATION   triamcinolone ointment (KENALOG) 0.1 %   VITAMIN D PO   Shonna Chock, PA-C Surgical Short Stay/Anesthesiology [facility] Phone [phone] [facility] Phone [phone] 10/14/2023 1:08 PM

## 2023-10-14 NOTE — Progress Notes (Signed)
 PCP - Dr Renford Dills Cardiologist - none  Chest x-ray - n/a EKG - DOS - 10/17/23 Stress Test - n/a ECHO - 2023 @ [facility] Cardiac Cath - n/a  ICD Pacemaker/Loop - n/a  Sleep Study -  n/a  Diabetes- n/a  Aspirin and Plavix Instructions: Continue  NPO   Anesthesia review: Yes  STOP now taking Aleve, Naproxen, Ibuprofen, Motrin, Advil, Goody's, BC's, all herbal medications, fish oil, and all vitamins.   Coronavirus Screening Do you have any of the following symptoms:  Cough yes/no: No Fever (>100.61F)  yes/no: No Runny nose occasional r/t allergies Sore throat yes/no: No Difficulty breathing/shortness of breath  yes/no: No  Have you traveled in the last 14 days and where? yes/no: No  Patient verbalized understanding of instructions that were given via phone.

## 2023-10-17 ENCOUNTER — Other Ambulatory Visit: Payer: Self-pay

## 2023-10-17 ENCOUNTER — Inpatient Hospital Stay (HOSPITAL_COMMUNITY)
Admission: RE | Admit: 2023-10-17 | Discharge: 2023-10-17 | Disposition: A | Discharge status: Home / Self Care | Source: Ambulatory Visit | Attending: Interventional Radiology | Admitting: Interventional Radiology

## 2023-10-17 ENCOUNTER — Encounter (HOSPITAL_COMMUNITY): Payer: Self-pay | Admitting: Interventional Radiology

## 2023-10-17 ENCOUNTER — Other Ambulatory Visit (HOSPITAL_COMMUNITY): Payer: Self-pay | Admitting: Radiology

## 2023-10-17 ENCOUNTER — Inpatient Hospital Stay (HOSPITAL_COMMUNITY)

## 2023-10-17 ENCOUNTER — Encounter (HOSPITAL_COMMUNITY): Payer: Self-pay

## 2023-10-17 ENCOUNTER — Inpatient Hospital Stay (HOSPITAL_COMMUNITY): Payer: Self-pay | Admitting: Vascular Surgery

## 2023-10-17 ENCOUNTER — Inpatient Hospital Stay (HOSPITAL_COMMUNITY)
Admission: RE | Admit: 2023-10-17 | Discharge: 2023-10-18 | DRG: 027 | Disposition: A | Discharge status: Home / Self Care | Attending: Interventional Radiology | Admitting: Interventional Radiology

## 2023-10-17 ENCOUNTER — Encounter (HOSPITAL_COMMUNITY)
Admission: RE | Disposition: A | Discharge status: Home / Self Care | Payer: Self-pay | Source: Home / Self Care | Attending: Interventional Radiology

## 2023-10-17 DIAGNOSIS — Z7982 Long term (current) use of aspirin: Secondary | ICD-10-CM | POA: Diagnosis not present

## 2023-10-17 DIAGNOSIS — Z8673 Personal history of transient ischemic attack (TIA), and cerebral infarction without residual deficits: Secondary | ICD-10-CM | POA: Diagnosis not present

## 2023-10-17 DIAGNOSIS — H538 Other visual disturbances: Secondary | ICD-10-CM | POA: Diagnosis not present

## 2023-10-17 DIAGNOSIS — I671 Cerebral aneurysm, nonruptured: Secondary | ICD-10-CM | POA: Diagnosis not present

## 2023-10-17 DIAGNOSIS — Z881 Allergy status to other antibiotic agents status: Secondary | ICD-10-CM | POA: Diagnosis not present

## 2023-10-17 DIAGNOSIS — D649 Anemia, unspecified: Secondary | ICD-10-CM | POA: Diagnosis not present

## 2023-10-17 DIAGNOSIS — Z90710 Acquired absence of both cervix and uterus: Secondary | ICD-10-CM

## 2023-10-17 DIAGNOSIS — Z79899 Other long term (current) drug therapy: Secondary | ICD-10-CM | POA: Diagnosis not present

## 2023-10-17 DIAGNOSIS — Z01818 Encounter for other preprocedural examination: Secondary | ICD-10-CM

## 2023-10-17 DIAGNOSIS — I10 Essential (primary) hypertension: Secondary | ICD-10-CM | POA: Diagnosis not present

## 2023-10-17 DIAGNOSIS — Z7902 Long term (current) use of antithrombotics/antiplatelets: Secondary | ICD-10-CM

## 2023-10-17 HISTORY — PX: RADIOLOGY WITH ANESTHESIA: SHX6223

## 2023-10-17 HISTORY — PX: IR ANGIO VERTEBRAL SEL VERTEBRAL BILAT MOD SED: IMG5369

## 2023-10-17 HISTORY — PX: IR TRANSCATH/EMBOLIZ: IMG695

## 2023-10-17 HISTORY — PX: IR CT HEAD LTD: IMG2386

## 2023-10-17 HISTORY — PX: IR 3D INDEPENDENT WKST: IMG2385

## 2023-10-17 LAB — BASIC METABOLIC PANEL
Anion gap: 7 (ref 5–15)
BUN: 25 mg/dL — ABNORMAL HIGH (ref 8–23)
CO2: 25 mmol/L (ref 22–32)
Calcium: 9.1 mg/dL (ref 8.9–10.3)
Chloride: 108 mmol/L (ref 98–111)
Creatinine, Ser: 1 mg/dL (ref 0.44–1.00)
GFR, Estimated: >60 mL/min (ref >60)
Glucose, Bld: 96 mg/dL (ref 70–99)
Potassium: 3.9 mmol/L (ref 3.5–5.1)
Sodium: 140 mmol/L (ref 135–145)

## 2023-10-17 LAB — CBC WITH DIFFERENTIAL/PLATELET
Abs Immature Granulocytes: 0.01 10*3/uL (ref 0.00–0.07)
Basophils Absolute: 0.1 10*3/uL (ref 0.0–0.1)
Basophils Relative: 1 %
Eosinophils Absolute: 0.2 10*3/uL (ref 0.0–0.5)
Eosinophils Relative: 4 %
HCT: 33.4 % — ABNORMAL LOW (ref 36.0–46.0)
Hemoglobin: 10.9 g/dL — ABNORMAL LOW (ref 12.0–15.0)
Immature Granulocytes: 0 %
Lymphocytes Relative: 22 %
Lymphs Abs: 1.3 10*3/uL (ref 0.7–4.0)
MCH: 29.1 pg (ref 26.0–34.0)
MCHC: 32.6 g/dL (ref 30.0–36.0)
MCV: 89.3 fL (ref 80.0–100.0)
Monocytes Absolute: 0.5 10*3/uL (ref 0.1–1.0)
Monocytes Relative: 9 %
Neutro Abs: 3.7 10*3/uL (ref 1.7–7.7)
Neutrophils Relative %: 64 %
Platelets: 154 10*3/uL (ref 150–400)
RBC: 3.74 MIL/uL — ABNORMAL LOW (ref 3.87–5.11)
RDW: 13.3 % (ref 11.5–15.5)
WBC: 5.9 10*3/uL (ref 4.0–10.5)
nRBC: 0 % (ref 0.0–0.2)

## 2023-10-17 LAB — ABO/RH: ABO/RH(D): A POS

## 2023-10-17 LAB — TYPE AND SCREEN
ABO/RH(D): A POS
Antibody Screen: NEGATIVE

## 2023-10-17 LAB — MRSA NEXT GEN BY PCR, NASAL: MRSA by PCR Next Gen: NOT DETECTED

## 2023-10-17 LAB — PROTIME-INR
INR: 1 (ref 0.8–1.2)
Prothrombin Time: 13.8 s (ref 11.4–15.2)

## 2023-10-17 LAB — HEPARIN LEVEL (UNFRACTIONATED): Heparin Unfractionated: 0.29 [IU]/mL — ABNORMAL LOW (ref 0.30–0.70)

## 2023-10-17 SURGERY — RADIOLOGY WITH ANESTHESIA
Anesthesia: General

## 2023-10-17 MED ORDER — NITROGLYCERIN 1 MG/10 ML FOR IR/CATH LAB
INTRA_ARTERIAL | Status: AC
  Filled 2023-10-17: qty 10

## 2023-10-17 MED ORDER — HEPARIN (PORCINE) 25000 UT/250ML-% IV SOLN: 500.0000 [IU]/h | INTRAVENOUS | Status: DC

## 2023-10-17 MED ORDER — CHLORHEXIDINE GLUCONATE 0.12 % MT SOLN
OROMUCOSAL | Status: AC
  Filled 2023-10-17: qty 15

## 2023-10-17 MED ORDER — CEFAZOLIN SODIUM-DEXTROSE 2-4 GM/100ML-% IV SOLN
INTRAVENOUS | Status: AC
  Filled 2023-10-17: qty 100

## 2023-10-17 MED ORDER — FENTANYL CITRATE (PF) 100 MCG/2ML IJ SOLN: 25.0000 ug | INTRAMUSCULAR | Status: DC | PRN

## 2023-10-17 MED ORDER — PROPOFOL 1000 MG/100ML IV EMUL
INTRAVENOUS | Status: AC
  Filled 2023-10-17: qty 200

## 2023-10-17 MED ORDER — CLEVIDIPINE BUTYRATE 0.5 MG/ML IV EMUL
0.0000 mg/h | INTRAVENOUS | Status: DC
  Administered 2023-10-17: 4 mg/h via INTRAVENOUS
  Administered 2023-10-17: 2 mg/h via INTRAVENOUS
  Administered 2023-10-17: 6 mg/h via INTRAVENOUS
  Filled 2023-10-17: qty 50

## 2023-10-17 MED ORDER — SODIUM CHLORIDE 0.9 % NICU IV INFUSION SIMPLE
250.0000 mL | INJECTION | Freq: Once | INTRAVENOUS | Status: AC
Start: 2023-10-17 — End: 2023-10-18
  Administered 2023-10-17: 250 mL via INTRAVENOUS

## 2023-10-17 MED ORDER — LIDOCAINE 2% (20 MG/ML) 5 ML SYRINGE
INTRAMUSCULAR | Status: DC | PRN
  Administered 2023-10-17: 40 mg via INTRAVENOUS

## 2023-10-17 MED ORDER — CEFAZOLIN SODIUM-DEXTROSE 2-4 GM/100ML-% IV SOLN
2.0000 g | INTRAVENOUS | Status: AC
  Administered 2023-10-17: 2 g via INTRAVENOUS

## 2023-10-17 MED ORDER — ONDANSETRON HCL 4 MG/2ML IJ SOLN: 4.0000 mg | Freq: Four times a day (QID) | INTRAMUSCULAR | Status: DC | PRN

## 2023-10-17 MED ORDER — CHLORHEXIDINE GLUCONATE 0.12 % MT SOLN
15.0000 mL | Freq: Once | OROMUCOSAL | Status: AC
  Administered 2023-10-17: 15 mL via OROMUCOSAL

## 2023-10-17 MED ORDER — HEPARIN (PORCINE) 25000 UT/250ML-% IV SOLN
INTRAVENOUS | Status: AC
  Administered 2023-10-17: 500 [IU]/h via INTRAVENOUS
  Filled 2023-10-17: qty 250

## 2023-10-17 MED ORDER — FENTANYL CITRATE (PF) 250 MCG/5ML IJ SOLN
INTRAMUSCULAR | Status: DC | PRN
  Administered 2023-10-17: 100 ug via INTRAVENOUS

## 2023-10-17 MED ORDER — ACETAMINOPHEN 160 MG/5ML PO SOLN
650.0000 mg | ORAL | Status: DC | PRN
Start: 2023-10-17 — End: 2023-10-18

## 2023-10-17 MED ORDER — PROPOFOL 10 MG/ML IV BOLUS
INTRAVENOUS | Status: DC | PRN
  Administered 2023-10-17: 50 mg via INTRAVENOUS
  Administered 2023-10-17: 25 ug/kg/min via INTRAVENOUS
  Administered 2023-10-17: 50 mg via INTRAVENOUS
  Administered 2023-10-17: 150 mg via INTRAVENOUS

## 2023-10-17 MED ORDER — CLOPIDOGREL BISULFATE 75 MG PO TABS
75.0000 mg | ORAL_TABLET | Freq: Every day | ORAL | Status: DC
  Administered 2023-10-18: 75 mg via ORAL
  Filled 2023-10-17: qty 1

## 2023-10-17 MED ORDER — IOHEXOL 300 MG/ML  SOLN: 150.0000 mL | Freq: Once | INTRAMUSCULAR | Status: DC | PRN

## 2023-10-17 MED ORDER — ROCURONIUM BROMIDE 10 MG/ML (PF) SYRINGE
PREFILLED_SYRINGE | INTRAVENOUS | Status: DC | PRN
  Administered 2023-10-17: 20 mg via INTRAVENOUS
  Administered 2023-10-17: 50 mg via INTRAVENOUS
  Administered 2023-10-17 (×2): 20 mg via INTRAVENOUS

## 2023-10-17 MED ORDER — CLOPIDOGREL BISULFATE 75 MG PO TABS: 75.0000 mg | ORAL_TABLET | Freq: Every day | ORAL | Status: DC

## 2023-10-17 MED ORDER — OXYCODONE HCL 5 MG PO TABS: 5.0000 mg | ORAL_TABLET | Freq: Once | ORAL | Status: DC | PRN

## 2023-10-17 MED ORDER — EPTIFIBATIDE 20 MG/10ML IV SOLN
INTRAVENOUS | Status: AC
  Filled 2023-10-17: qty 10

## 2023-10-17 MED ORDER — CHLORHEXIDINE GLUCONATE CLOTH 2 % EX PADS
6.0000 | MEDICATED_PAD | Freq: Every day | CUTANEOUS | Status: DC
  Administered 2023-10-17: 6 via TOPICAL

## 2023-10-17 MED ORDER — ACETAMINOPHEN 650 MG RE SUPP: 650.0000 mg | RECTAL | Status: DC | PRN

## 2023-10-17 MED ORDER — LACTATED RINGERS IV SOLN
INTRAVENOUS | Status: DC | PRN
Start: 2023-10-17 — End: 2023-10-17

## 2023-10-17 MED ORDER — ORAL CARE MOUTH RINSE
15.0000 mL | OROMUCOSAL | Status: DC
  Administered 2023-10-17: 15 mL via OROMUCOSAL

## 2023-10-17 MED ORDER — SODIUM CHLORIDE 0.9 % IV SOLN: INTRAVENOUS | Status: DC

## 2023-10-17 MED ORDER — NIMODIPINE 30 MG PO CAPS
0.0000 mg | ORAL_CAPSULE | ORAL | Status: DC
Start: 2023-10-17 — End: 2023-10-17

## 2023-10-17 MED ORDER — IOHEXOL 300 MG/ML  SOLN
100.0000 mL | Freq: Once | INTRAMUSCULAR | Status: DC | PRN
Start: 2023-10-17 — End: 2023-10-18

## 2023-10-17 MED ORDER — PHENYLEPHRINE HCL-NACL 20-0.9 MG/250ML-% IV SOLN
INTRAVENOUS | Status: DC | PRN
  Administered 2023-10-17: 50 ug/min via INTRAVENOUS
  Administered 2023-10-17: 40 ug/min via INTRAVENOUS
  Administered 2023-10-17: 30 ug/min via INTRAVENOUS

## 2023-10-17 MED ORDER — ORAL CARE MOUTH RINSE: 15.0000 mL | OROMUCOSAL | Status: DC | PRN

## 2023-10-17 MED ORDER — EPTIFIBATIDE 20 MG/10ML IV SOLN
INTRAVENOUS | Status: AC | PRN
  Administered 2023-10-17 (×3): 1.5 mg via INTRA_ARTERIAL

## 2023-10-17 MED ORDER — HEPARIN SODIUM (PORCINE) 1000 UNIT/ML IJ SOLN
INTRAMUSCULAR | Status: DC | PRN
  Administered 2023-10-17: 3000 [IU] via INTRAVENOUS

## 2023-10-17 MED ORDER — HEPARIN (PORCINE) 25000 UT/250ML-% IV SOLN
500.0000 [IU]/h | INTRAVENOUS | Status: DC
  Filled 2023-10-17: qty 250

## 2023-10-17 MED ORDER — SODIUM CHLORIDE 0.9 % IV SOLN: INTRAVENOUS | Status: AC

## 2023-10-17 MED ORDER — ONDANSETRON HCL 4 MG/2ML IJ SOLN
INTRAMUSCULAR | Status: DC | PRN
  Administered 2023-10-17: 4 mg via INTRAVENOUS

## 2023-10-17 MED ORDER — SUGAMMADEX SODIUM 200 MG/2ML IV SOLN
INTRAVENOUS | Status: DC | PRN
  Administered 2023-10-17: 116 mg via INTRAVENOUS

## 2023-10-17 MED ORDER — CLEVIDIPINE BUTYRATE 0.5 MG/ML IV EMUL
INTRAVENOUS | Status: DC | PRN
Start: 2023-10-17 — End: 2023-10-17
  Administered 2023-10-17: 1 mg/h via INTRAVENOUS

## 2023-10-17 MED ORDER — ACETAMINOPHEN 325 MG PO TABS
650.0000 mg | ORAL_TABLET | ORAL | Status: DC | PRN
  Administered 2023-10-17 – 2023-10-18 (×3): 650 mg via ORAL
  Filled 2023-10-17 (×3): qty 2

## 2023-10-17 MED ORDER — OXYCODONE HCL 5 MG/5ML PO SOLN: 5.0000 mg | Freq: Once | ORAL | Status: DC | PRN

## 2023-10-17 MED ORDER — ORAL CARE MOUTH RINSE: 15.0000 mL | Freq: Once | OROMUCOSAL | Status: AC

## 2023-10-17 MED ORDER — FENTANYL CITRATE (PF) 100 MCG/2ML IJ SOLN
INTRAMUSCULAR | Status: AC
  Filled 2023-10-17: qty 2

## 2023-10-17 MED ORDER — HEPARIN (PORCINE) 25000 UT/250ML-% IV SOLN: 450.0000 [IU]/h | INTRAVENOUS | Status: DC

## 2023-10-17 MED ORDER — PHENYLEPHRINE 80 MCG/ML (10ML) SYRINGE FOR IV PUSH (FOR BLOOD PRESSURE SUPPORT)
PREFILLED_SYRINGE | INTRAVENOUS | Status: DC | PRN
  Administered 2023-10-17: 40 ug via INTRAVENOUS
  Administered 2023-10-17 (×2): 80 ug via INTRAVENOUS
  Administered 2023-10-17: 40 ug via INTRAVENOUS

## 2023-10-17 MED ORDER — ASPIRIN 81 MG PO CHEW
81.0000 mg | CHEWABLE_TABLET | Freq: Every day | ORAL | Status: DC
  Administered 2023-10-18: 81 mg via ORAL
  Filled 2023-10-17: qty 1

## 2023-10-17 MED ORDER — ASPIRIN 81 MG PO CHEW: 81.0000 mg | CHEWABLE_TABLET | Freq: Every day | ORAL | Status: DC

## 2023-10-17 NOTE — Progress Notes (Signed)
 Orthopedic Tech Progress Note Patient Details:  Tammy Aguirre 1960/11/27 [id number]  Ortho Devices Type of Ortho Device: Knee Immobilizer Ortho Device/Splint Location: RLE Ortho Device/Splint Interventions: Ordered, Application   Post Interventions Patient Tolerated: Well Instructions Provided: Care of device  Donald Pore 10/17/2023, 2:15 PM

## 2023-10-17 NOTE — Sedation Documentation (Signed)
 Pt under the care of anesthesia. See CRNA charting.

## 2023-10-17 NOTE — Transfer of Care (Signed)
 Immediate Anesthesia Transfer of Care Note  Patient: Tammy Aguirre  Procedure(s) Performed: RADIOLOGY WITH ANESTHESIA  Patient Location: PACU  Anesthesia Type:General  Level of Consciousness: awake, alert , and oriented  Airway & Oxygen Therapy: Patient Spontanous Breathing and Patient connected to nasal cannula oxygen  Post-op Assessment: Report given to RN and Post -op Vital signs reviewed and stable  Post vital signs: Reviewed and stable  Last Vitals:  Vitals Value Taken Time  BP 108/68 10/17/23 1145  Temp 98   Pulse 83 10/17/23 1145  Resp 18 10/17/23 1145  SpO2 98 % 10/17/23 1145  Vitals shown include unfiled device data.  Last Pain:  Vitals:   10/17/23 0610  PainSc: 0-No pain         Complications: No notable events documented.

## 2023-10-17 NOTE — Consult Note (Deleted)
   The note originally documented on this encounter has been moved the the encounter in which it belongs.

## 2023-10-17 NOTE — Progress Notes (Signed)
 Patient seen in Neuro ICU  post uncomplicated cerebral angiogram  today with Dr. Julieanne Cotton    Patient seen at bedside with Dr. Julieanne Cotton. Family present No complaints except ready to have something to drink - specifically denies HA, n/v, chest pain, dyspnea, vision changes, speech difficulties. Patient reporting light sensitivity and headache without tinnitus. Denies n/v, chest pain, dyspnea. Neuro exam unchanged from pre-procedure, no deficits noted. Alert, aware and oriented X 3, Speech clear and comprehension is intact. PERRLA bilaterally, No facial droop noted. Can spontaneously move all 4 extremities.   Previous oozing noted to right CFA puncture site. Pressure held for 30 minutes. Currently clean, dry, dressed appropriately, no active bleeding, soft, appropriately tender to palpation.   Plan: - Bedrest, HOB flat until at least 1830 tonight, foley to remain overnight - May have sips of water and then advance diet to full liquids if right CFA puncture site stable @ 1830 - Heparin gtt (managed by pharmacy) - NIR will assess patient in AM for potential discharge   Please call Dr. Julieanne Cotton with overnight concerns.

## 2023-10-17 NOTE — Anesthesia Postprocedure Evaluation (Signed)
 Anesthesia Post Note  Patient: Tammy Aguirre  Procedure(s) Performed: RADIOLOGY WITH ANESTHESIA     Patient location during evaluation: PACU Anesthesia Type: General Level of consciousness: awake and alert Pain management: pain level controlled Vital Signs Assessment: post-procedure vital signs reviewed and stable Respiratory status: spontaneous breathing, nonlabored ventilation, respiratory function stable and patient connected to nasal cannula oxygen Cardiovascular status: blood pressure returned to baseline and stable Postop Assessment: no apparent nausea or vomiting Anesthetic complications: no   No notable events documented.  Last Vitals:  Vitals:   10/17/23 1200 10/17/23 1215  BP: 117/63 112/71  Pulse: 74 76  Resp: 15 13  Temp:  36.7 C  SpO2: 99% 98%    Last Pain:  Vitals:   10/17/23 1215  PainSc: Asleep                 Zebbie Ace S

## 2023-10-17 NOTE — Sedation Documentation (Signed)
 ACT 210

## 2023-10-17 NOTE — Consult Note (Signed)
 Chief Complaint: Patient was seen in consultation today for intra-cranial aneurysms  Referring Physician(s): Dr. Renford Dills  Supervising Physician: Julieanne Cotton  Patient Status: [facility] - Out-pt  History of Present Illness: Tammy Aguirre is a 63 y.o. female with history of HTN, migraines, TIA recently found to have multiple intra-cranial aneurysms.  She met with Dr. Corliss Skains to discuss right MCA bifurcation region aneurysm, right posterior communicating artery aneurysm, and left posterior communicating artery aneurysm.  After discussion patient elected to proceed with intervention.  Anticipate intervention in the right distribution.   Patient presents for intervention today in her usual state of health.  She is accompanied by family who are aware of the goals of the procedure today.  She has been NPO.  She has been taking her Plavix and aspirin appropriately. Unfortunately, she continues to have regular headaches.   Past Medical History:  Diagnosis Date   Anemia    History of   Arthritis    Right Shoulder   History of TIA (transient ischemic attack)    Hypertension    Migraines    History of   PONV (postoperative nausea and vomiting)     Past Surgical History:  Procedure Laterality Date   ABDOMINAL HYSTERECTOMY     CESAREAN SECTION     x 1   COLONOSCOPY     IR RADIOLOGIST EVAL & MGMT  09/30/2023   MRI   NECK SURGERY     plate in neck   SHOULDER ARTHROSCOPY WITH OPEN ROTATOR CUFF REPAIR AND DISTAL CLAVICLE ACROMINECTOMY Right 11/24/2021   Procedure: RIGHT SHOULDER ARTHROSCOPIC SUBACROMIAL DECOMPRESSION WITH OPEN ROTATOR CUFF REPAIR AND DISTAL CLAVICLE ACROMINECTOMY;  Surgeon: Valeria Batman, MD;  Location: [facility] ORS;  Service: Orthopedics;  Laterality: Right;   UPPER GI ENDOSCOPY      Allergies: Tetracyclines & related  Medications: Prior to Admission medications   Medication Sig Start Date End Date Taking? Authorizing Provider  aspirin EC 81 MG tablet  Take 81 mg by mouth daily. Swallow whole.    [provider]  clopidogrel (PLAVIX) 75 MG tablet Take 1 tablet (75 mg total) by mouth daily. 10/04/23 01/02/24  Mickie Kay, NP  fexofenadine (ALLEGRA) 180 MG tablet Take 180 mg by mouth daily.    [provider]  finasteride (PROSCAR) 5 MG tablet Take 5 mg by mouth daily. 11/07/17   [provider]  irbesartan-hydrochlorothiazide (AVALIDE) 150-12.5 MG tablet Take 1 tablet by mouth every evening. 10/19/17   [provider]  OVER THE COUNTER MEDICATION Take 4 capsules by mouth daily. Nutrafol supplement    [provider]  triamcinolone ointment (KENALOG) 0.1 % Apply 1 Application topically 2 (two) times daily as needed (rash). 07/05/23   [provider]  VITAMIN D PO Take 1,000-2,000 Units by mouth daily. liquid    [provider]     History reviewed. No pertinent family history.  Social History   Socioeconomic History   Marital status: Legally Separated    Spouse name: Not on file   Number of children: Not on file   Years of education: Not on file   Highest education level: Not on file  Occupational History   Not on file  Tobacco Use   Smoking status: Never   Smokeless tobacco: Never  Vaping Use   Vaping status: Never Used  Substance and Sexual Activity   Alcohol use: No   Drug use: No   Sexual activity: Yes    Birth control/protection: Surgical  Comment: Hysterectomy  Other Topics Concern   Not on file  Social History Narrative   Not on file   Social Drivers of Health   Financial Resource Strain: Not on file  Food Insecurity: Not on file  Transportation Needs: Not on file  Physical Activity: Not on file  Stress: Not on file  Social Connections: Not on file     Review of Systems: A 12 point ROS discussed and pertinent positives are indicated in the HPI above.  All other systems are negative.  Review of Systems  Constitutional:  Negative for fatigue and  fever.  Respiratory:  Negative for cough and shortness of breath.   Cardiovascular:  Negative for chest pain.  Gastrointestinal:  Negative for abdominal pain, nausea and vomiting.  Musculoskeletal:  Negative for back pain.  Psychiatric/Behavioral:  Negative for behavioral problems and confusion.     Vital Signs: BP 134/80   Pulse 61   Temp 97.6 F (36.4 C) (Oral)   Resp 18   SpO2 100%   Physical Exam Vitals and nursing note reviewed.  Constitutional:      General: She is not in acute distress.    Appearance: Normal appearance. She is not ill-appearing.  HENT:     Mouth/Throat:     Mouth: Mucous membranes are moist.     Pharynx: Oropharynx is clear.  Cardiovascular:     Rate and Rhythm: Normal rate and regular rhythm.  Pulmonary:     Effort: Pulmonary effort is normal. No respiratory distress.     Breath sounds: Normal breath sounds.  Abdominal:     General: Abdomen is flat.     Palpations: Abdomen is soft.  Skin:    General: Skin is warm and dry.  Neurological:     General: No focal deficit present.     Mental Status: She is alert and oriented to person, place, and time. Mental status is at baseline.  Psychiatric:        Mood and Affect: Mood normal.        Behavior: Behavior normal.        Thought Content: Thought content normal.        Judgment: Judgment normal.      MD Evaluation Airway: WNL Heart: WNL Abdomen: WNL Chest/ Lungs: WNL ASA  Classification: 3 Mallampati/Airway Score: Two   Imaging: IR Radiologist Eval & Mgmt Result Date: 09/30/2023 EXAM: NEW PATIENT OFFICE VISIT CHIEF COMPLAINT: Headaches.  Recent discovery of multiple intracranial aneurysms. Current Pain Level: 1-10 HISTORY OF PRESENT ILLNESS: The patient is a 63 year old right handed lady who presents accompanied by her sister and daughter for evaluation of recently discovered multiple intracranial aneurysms. History was obtained from the patient, the history and also the patient's  electronic medical records. The patient was seen recently by her primary MD for evaluation of new onset of bioccipital headaches. The first episode was about 3 weeks ago when the patient experienced sudden sharp pain in the occipital regions while in exercise class. There was no associated nausea or vomiting though she did feel sensation of nearly passing out. The patient had mild diaphoresis at the time. She denies any chest pain, shortness of breath or palpitations at this time. This was then followed by further episodes of sudden severe sharp pain in the occipital regions this time also involving the upper cervical region more towards the right of the midline. This also occurred while the patient was doing cycling exercises. This headache was associated with muscle spasm again with  episode lasting about 5 minutes. This time she felt nauseated but had no vomiting. This is associated with lightheadedness but never passed out. Patient reports no blurred vision, diplopia, or blindness. There was no difficulty with balance also. No history of perioral numbness, or tinnitus or of gait imbalance. This prompted a CT angiogram of the head and neck which demonstrated 2 intracranial aneurysms in the right anterior circulation, and 1 in the left anterior circulation. She denies any chest pains, shortness of breath or of breathing difficulties or papilledema. No history of wheezing, coughing or sputum production. Patient reports her weight to be steady, appetite normal. Denies any abdominal pain, constipation, diarrhea or melena. No dysuria, hematuria, or polyuria. No chills, fever or rigors. Past history of hypertension, allergic rhinitis. History polyp in the colon. Past medical history of neck surgery for disc disease in 2011. Rotator cuff tear in 2023. Vein stripping in 2015. Hysterectomy for endometriosis in 2003. Medications: Amlodipine, irbesartan-hydrochlorothiazide. Allergies: Seasonal allergies.  Tetracycline.  Social History: Nonsmoker.  Does not drink alcohol. Family History: No history of brain aneurysms as per patient. High blood pressure, cancer. REVIEW OF SYSTEMS: Negative unless as mentioned above. PHYSICAL EXAMINATION: Awake, alert, oriented to time, place, space. Speech and comprehension intact. Appears moderately distress. Normal eye contact. Neurologically no gross lateralizing neurological abnormalities evident. Station and gait normal. ASSESSMENT AND PLAN: The patient and the accompanying family members were informed of the CTA findings, revealing the presence of one aneurysm in the right MCA bifurcation region, and one in the right posterior communicating artery region, and one in the left posterior communicating artery region. The aneurysms at the maximum are about 5 mm on the right side and about 3 mm on the left side. The natural history of unruptured brain aneurysms was reviewed with patient and the family. The risk of rupture of 1-2% per year per aneurysm with the attendant severe morbidity and mortality complications in the event of a subarachnoid hemorrhage. The risk of rupture has been associated with hypertension, female gender and cigarette smoking which the patient denies. Also there is no family history of brain aneurysms. It was felt that the aneurysms were incidental, given the location of her headaches. It was unlikely that these were responsible for her headaches. Nevertheless, potential management considerations were those of obliteration of the aneurysms from the parent vessel, thereby reducing and eliminating the risk of growth and rupture of the aneurysms. The endovascular options of primary coiling, stent assisted coiling, flow diversion, and of intra saccular endovascular treatments were reviewed in detail. This would depend on the angio architecture of the brain aneurysms. Formal diagnostic catheter arteriogram via a trans radial or transfemoral route under conscious sedation will be  needed Depending on the morphology of the aneurysms, the most appropriate endovascular option would be decided upon. For endovascular treatment the patient would be put under general anesthesia with the intent to retreat one or both aneurysms on the right side initially. Patient would have to be started on dual antiplatelets, aspirin 81 mg a day and Plavix 300 mg bolus dose followed by 75 mg per day starting 5-7 days prior to the procedure. A P2Y12 would be drawn in order to ensure adequate platelet inhibition. The risk of the endovascular procedure of 1-2% chance of an ischemic stroke with a remote chance of intra procedure rupture with attendant fatality was also reviewed. Also mentioned was the remote possibility of delayed intracranial hemorrhage. Questions were answered to their satisfaction. The patient would like to proceed  with scheduled endovascular treatment under general anesthesia proceeded by a diagnostic arteriogram possibly with conscious sedation at the same time. Patient advised to continue with light exercises and maintaining adequate hydration and taking her antihypertensive medications. They were asked to call for concerns or questions. Electronically Signed   By: Julieanne Cotton M.D.   On: 09/30/2023 17:50   CT ANGIO HEAD W OR WO CONTRAST Result Date: 09/21/2023 CLINICAL DATA:  Exertional headache. Right MCA aneurysm shown by MR angiography EXAM: CT ANGIOGRAPHY HEAD TECHNIQUE: Multidetector CT imaging of the head was performed using the standard protocol during bolus administration of intravenous contrast. Multiplanar CT image reconstructions and MIPs were obtained to evaluate the vascular anatomy. RADIATION DOSE REDUCTION: This exam was performed according to the departmental dose-optimization program which includes automated exposure control, adjustment of the mA and/or kV according to patient size and/or use of iterative reconstruction technique. CONTRAST:  75mL OMNIPAQUE IOHEXOL 350  MG/ML SOLN COMPARISON:  09/20/2023 FINDINGS: CT HEAD Brain: Normal appearance without evidence of old or acute infarction, mass lesion, hemorrhage, hydrocephalus or extra-axial collection. Vascular: No abnormal vascular finding on the standard head CT. Skull: Normal Sinuses: Clear Other: None CTA HEAD Anterior circulation: Both internal carotid arteries are patent through the skull base and siphon regions. On the right, there is a 3 x 5 mm posterior communicating artery aneurysm. There is a lobular 3-5 mm aneurysm at the right MCA bifurcation. On the left, there is a 3 mm posterior communicating artery aneurysm. No left anterior or middle cerebral artery aneurysm. Posterior circulation: Both vertebral arteries are widely patent through the foramen magnum to the basilar artery. No basilar stenosis. Posterior circulation branch vessels are normal. Venous sinuses: Patent and normal. Anatomic variants: None other significant. Review of the MIP images confirms the above findings. IMPRESSION: 1. 3 x 5 mm right posterior communicating artery aneurysm. 2. 3 mm left posterior communicating artery aneurysm. 3. 3-5 mm lobular aneurysm at the right MCA bifurcation. 4. No intracranial large vessel occlusion or correctable stenosis. 5. Neuro endovascular consultation recommended for consideration of treatment given that the patient is symptomatic. Electronically Signed   By: Paulina Fusi M.D.   On: 09/21/2023 16:55   MR ANGIO HEAD WO CONTRAST Result Date: 09/20/2023 CLINICAL DATA:  Exertional occipital headache EXAM: MRA HEAD WITHOUT CONTRAST TECHNIQUE: Angiographic images of the Circle of Willis were acquired using MRA technique without intravenous contrast. COMPARISON:  MRI report from 03/22/2001 FINDINGS: Both internal carotid arteries are widely patent into the brain. No siphon stenosis. The anterior and middle cerebral vessels are patent without stenosis or occlusion. There appears to be a 3-4 mm aneurysm at the right MCA  bifurcation. CT angiography or catheter angiography would be recommended for more accurate characterization. Both vertebral arteries are widely patent to the basilar. No basilar stenosis. Posterior circulation branch vessels appear normal. IMPRESSION: 1. 3-4 mm aneurysm at the right MCA bifurcation. CT angiography or catheter angiography would be recommended for more accurate characterization. 2. No other abnormality. 3. Report will be called upon office opening. Electronically Signed   By: Paulina Fusi M.D.   On: 09/20/2023 18:19    Labs:  CBC: Recent Labs    10/17/23 0620  WBC 5.9  HGB 10.9*  HCT 33.4*  PLT 154    COAGS: Recent Labs    10/17/23 0620  INR 1.0    BMP: Recent Labs    10/17/23 0620  NA 140  K 3.9  CL 108  CO2 25  GLUCOSE  96  BUN 25*  CALCIUM 9.1  CREATININE 1.00  GFRNONAA >60    LIVER FUNCTION TESTS: No results for input(s): "BILITOT", "AST", "ALT", "ALKPHOS", "PROT", "ALBUMIN" in the last 8760 hours.  TUMOR MARKERS: No results for input(s): "AFPTM", "CEA", "CA199", "CHROMGRNA" in the last 8760 hours.  Assessment and Plan: Patient with past medical history of migraine headaches presents with complaint of multiple intra-cranial aneurysms.   Case reviewed by Dr. Corliss Skains who has met with the patient in consultation and who approves patient for procedure.  Patient presents today in their usual state of health.  She has been NPO and is not currently on blood thinners.  She has taken her Plavix and aspirin appropriately.  Recent P2Y12 119, 3/13  Risks and benefits of cerebral angiogram with intervention were discussed with the patient including, but not limited to bleeding, infection, vascular injury, contrast induced renal failure, stroke or even death.  This interventional procedure involves the use of X-rays and because of the nature of the planned procedure, it is possible that we will have prolonged use of X-ray fluoroscopy.  Potential radiation  risks to you include (but are not limited to) the following: - A slightly elevated risk for cancer  several years later in life. This risk is typically less than 0.5% percent. This risk is low in comparison to the normal incidence of human cancer, which is 33% for women and 50% for men according to the [employer]. - Radiation induced injury can include skin redness, resembling a rash, tissue breakdown / ulcers and hair loss (which can be temporary or permanent).   The likelihood of either of these occurring depends on the difficulty of the procedure and whether you are sensitive to radiation due to previous procedures, disease, or genetic conditions.   IF your procedure requires a prolonged use of radiation, you will be notified and given written instructions for further action.  It is your responsibility to monitor the irradiated area for the 2 weeks following the procedure and to notify your physician if you are concerned that you have suffered a radiation induced injury.    All of the patient's questions were answered, patient is agreeable to proceed.  Consent signed and in chart.    Thank you for this interesting consult.  I greatly enjoyed meeting Tammy Aguirre and look forward to participating in their care.  A copy of this report was sent to the requesting provider on this date.  Electronically Signed: Hoyt Koch, PA 10/17/2023, 8:01 AM   I spent a total of  30 Minutes   in face to face in clinical consultation, greater than 50% of which was counseling/coordinating care for intra-cranial aneurysms.

## 2023-10-17 NOTE — Anesthesia Procedure Notes (Signed)
 Arterial Line Insertion Start/End3/17/2025 7:20 AM, 10/17/2023 7:25 AM Performed by: Cy Blamer, CRNA, CRNA  Patient location: Pre-op. Preanesthetic checklist: patient identified, IV checked, site marked, risks and benefits discussed, surgical consent, monitors and equipment checked, pre-op evaluation, timeout performed and anesthesia consent Lidocaine 1% used for infiltration Left, radial was placed Catheter size: 20 G Hand hygiene performed  and maximum sterile barriers used  Allen's test indicative of satisfactory collateral circulation Attempts: 2 Procedure performed using ultrasound guided technique. Ultrasound Notes:anatomy identified, needle tip was noted to be adjacent to the nerve/plexus identified and no ultrasound evidence of intravascular and/or intraneural injection Following insertion, Biopatch. Post procedure assessment: normal and unchanged  Patient tolerated the procedure well with no immediate complications.

## 2023-10-17 NOTE — Progress Notes (Signed)
 PHARMACY - ANTICOAGULATION CONSULT NOTE  Pharmacy Consult for heparin Indication:  Post Interventional Neuroradiology Procedure    Allergies  Allergen Reactions   Tetracyclines & Related Rash    Patient Measurements: Height: 5\' 1"  (154.9 cm) Weight: 58 kg (127 lb 13.9 oz) IBW/kg (Calculated) : 47.8 Heparin Dosing Weight: 58 kg   Vital Signs: Temp: 98.9 F (37.2 C) (03/17 2000) Temp Source: Oral (03/17 2000) BP: 114/60 (03/17 2030) Pulse Rate: 82 (03/17 2030)  Labs: Recent Labs    10/17/23 0620 10/17/23 1948  HGB 10.9*  --   HCT 33.4*  --   PLT 154  --   LABPROT 13.8  --   INR 1.0  --   HEPARINUNFRC  --  0.29*  CREATININE 1.00  --     Estimated Creatinine Clearance: 48.4 mL/min (by C-G formula based on SCr of 1 mg/dL).   Medical History: Past Medical History:  Diagnosis Date   Anemia    History of   Arthritis    Right Shoulder   History of TIA (transient ischemic attack)    Hypertension    Migraines    History of   PONV (postoperative nausea and vomiting)     Medications:  Scheduled:   [START ON 10/18/2023] aspirin  81 mg Oral Daily   Or   [START ON 10/18/2023] aspirin  81 mg Per Tube Daily   Chlorhexidine Gluconate Cloth  6 each Topical Daily   [START ON 10/18/2023] clopidogrel  75 mg Oral Daily   Or   [START ON 10/18/2023] clopidogrel  75 mg Per Tube Daily   mouth rinse  15 mL Mouth Rinse 4 times per day   Infusions:   sodium chloride 75 mL/hr at 10/17/23 2000   clevidipine 4 mg/hr (10/17/23 2000)   heparin      Assessment: Patient is status post placement of pipeline vantage flow diverter device at R posterior communicating aneurysm. Pharmacy consulted to dose heparin.   Initial heparin level slightly above goal at 0.29.  No overt bleeding or complications noted.   Goal of Therapy:  Heparin level  0.1-0.25 units/ml Monitor platelets by anticoagulation protocol: Yes   Plan:  Decrease IV heparin infusion to 450 units/hr. Check anti-Xa  level in 6 hours. Heparin off at 8 am.  Continue to monitor H&H and platelets  Reece Leader, Loura Back, BCPS, BCCP Clinical Pharmacist  10/17/2023 8:57 PM   [place] pharmacy phone numbers are listed on [email or web address]

## 2023-10-17 NOTE — Sedation Documentation (Signed)
 Stent pipeline deployed in MCA @ 1026.

## 2023-10-17 NOTE — Sedation Documentation (Signed)
 ACT @ 233. MD Deveshwar aware.

## 2023-10-17 NOTE — Progress Notes (Signed)
 PHARMACY - ANTICOAGULATION CONSULT NOTE  Pharmacy Consult for heparin Indication:  Post Interventional Neuroradiology Procedure    Allergies  Allergen Reactions   Tetracyclines & Related Rash    Patient Measurements: Height: 5\' 1"  (154.9 cm) Weight: 58 kg (127 lb 13.9 oz) IBW/kg (Calculated) : 47.8 Heparin Dosing Weight: 58 kg   Vital Signs: Temp: 97.8 F (36.6 C) (03/17 1230) Temp Source: Oral (03/17 1230) BP: 108/62 (03/17 1400) Pulse Rate: 80 (03/17 1400)  Labs: Recent Labs    10/17/23 0620  HGB 10.9*  HCT 33.4*  PLT 154  LABPROT 13.8  INR 1.0  CREATININE 1.00    Estimated Creatinine Clearance: 48.4 mL/min (by C-G formula based on SCr of 1 mg/dL).   Medical History: Past Medical History:  Diagnosis Date   Anemia    History of   Arthritis    Right Shoulder   History of TIA (transient ischemic attack)    Hypertension    Migraines    History of   PONV (postoperative nausea and vomiting)     Medications:  Scheduled:   [START ON 10/18/2023] aspirin  81 mg Oral Daily   Or   [START ON 10/18/2023] aspirin  81 mg Per Tube Daily   chlorhexidine       [START ON 10/18/2023] clopidogrel  75 mg Oral Daily   Or   [START ON 10/18/2023] clopidogrel  75 mg Per Tube Daily   Infusions:   sodium chloride 75 mL/hr at 10/17/23 1300   clevidipine 6 mg/hr (10/17/23 1306)   heparin 500 Units/hr (10/17/23 1411)    Assessment: Patient is status post placement of pipeline vantage flow diverter device at R posterior communicating aneurysm. Pharmacy consulted to dose heparin.    Goal of Therapy:  Heparin level  0.1-0.25 units/ml Monitor platelets by anticoagulation protocol: Yes   Plan:  Start heparin infusion at 500 units/hr Check anti-Xa level in 6 hours and daily while on heparin Continue to monitor H&H and platelets  Cedric Fishman 10/17/2023,2:28 PM

## 2023-10-17 NOTE — Procedures (Signed)
 INR.  Status post four-vessel cerebral arteriogram.  Right CFA approach.  Findings.  1.  Trilobar irregular approximately 5 mm right MCA trifurcation region aneurysm.  2.  Approximately 4 mm x 2.6  mm right posterior communicating artery aneurysm just distal to its origin from the ipsilateral internal carotid artery.    3.  Approximately 2.5- 3 mm left P-comm region infundibulum.    Status post endovascular treatment of irregular trilobed right MCA trifurcation region aneurysm using a 2.5 mm x 12 mm pipeline vantage  flow diverter device with stasis.  Post CT brain no gross intracranial hemorrhage seen.  8 French Angio-Seal closure device deployed for hemostasis at the right groin puncture site.  Distal pulses all present.    Patient extubated.  Complaining of mild nausea.  Denies any headaches.  Follow simple commands appropriately.  Pupils approximately 3 mm bilaterally equal.  Sluggishly reactive.  No facial asymmetry.  Tongue midline.  Moves all fours equally   proportional to effort.  Fatima Sanger MD.

## 2023-10-17 NOTE — Progress Notes (Signed)
 Upon first assessment of IR site on 4N at 1227, site was bleeding and pooling in between Pt's legs. Pressure was immediately held and Dr. Corliss Skains paged. Dr. Corliss Skains responded by 1233 and instructed to undress and hold pressure for 20 minutes with quick clot. Pressure held until IR tech was available to hold pressure. Pressure held until 1300 and was re-dressed with quick clot and tegaderm. Site remains a level 0. Dr. Corliss Skains at bedside around 1315, instructed to start heparin at 1400.

## 2023-10-17 NOTE — Sedation Documentation (Signed)
ACT 204

## 2023-10-17 NOTE — Progress Notes (Signed)
 This RN spoke with IR MD on call Deveshwar regarding patients labile blood pressures, which are not in range of post-IR procedure orders of SBP 120-140.   Verbal order to administer NS bolus, increase NS fluid rate to 80cc/hr, and to maintain SBP >100.   Care ongoing.  Hughie Closs, RN

## 2023-10-17 NOTE — Anesthesia Procedure Notes (Signed)

## 2023-10-18 ENCOUNTER — Encounter (HOSPITAL_COMMUNITY): Payer: Self-pay | Admitting: Interventional Radiology

## 2023-10-18 LAB — CBC WITH DIFFERENTIAL/PLATELET
Abs Immature Granulocytes: 0.02 10*3/uL (ref 0.00–0.07)
Basophils Absolute: 0.1 10*3/uL (ref 0.0–0.1)
Basophils Relative: 1 %
Eosinophils Absolute: 0.2 10*3/uL (ref 0.0–0.5)
Eosinophils Relative: 3 %
HCT: 28.9 % — ABNORMAL LOW (ref 36.0–46.0)
Hemoglobin: 9.4 g/dL — ABNORMAL LOW (ref 12.0–15.0)
Immature Granulocytes: 0 %
Lymphocytes Relative: 23 %
Lymphs Abs: 1.2 10*3/uL (ref 0.7–4.0)
MCH: 29.3 pg (ref 26.0–34.0)
MCHC: 32.5 g/dL (ref 30.0–36.0)
MCV: 90 fL (ref 80.0–100.0)
Monocytes Absolute: 0.4 10*3/uL (ref 0.1–1.0)
Monocytes Relative: 8 %
Neutro Abs: 3.5 10*3/uL (ref 1.7–7.7)
Neutrophils Relative %: 65 %
Platelets: 127 10*3/uL — ABNORMAL LOW (ref 150–400)
RBC: 3.21 MIL/uL — ABNORMAL LOW (ref 3.87–5.11)
RDW: 13.6 % (ref 11.5–15.5)
WBC: 5.3 10*3/uL (ref 4.0–10.5)
nRBC: 0 % (ref 0.0–0.2)

## 2023-10-18 LAB — BASIC METABOLIC PANEL
Anion gap: 9 (ref 5–15)
BUN: 12 mg/dL (ref 8–23)
CO2: 22 mmol/L (ref 22–32)
Calcium: 8 mg/dL — ABNORMAL LOW (ref 8.9–10.3)
Chloride: 110 mmol/L (ref 98–111)
Creatinine, Ser: 0.78 mg/dL (ref 0.44–1.00)
GFR, Estimated: >60 mL/min (ref >60)
Glucose, Bld: 97 mg/dL (ref 70–99)
Potassium: 3.2 mmol/L — ABNORMAL LOW (ref 3.5–5.1)
Sodium: 141 mmol/L (ref 135–145)

## 2023-10-18 LAB — HEPARIN LEVEL (UNFRACTIONATED): Heparin Unfractionated: 0.12 [IU]/mL — ABNORMAL LOW (ref 0.30–0.70)

## 2023-10-18 LAB — PLATELET INHIBITION P2Y12: Platelet Function  P2Y12: 156 [PRU] — ABNORMAL LOW (ref 182–335)

## 2023-10-18 NOTE — Progress Notes (Signed)
 All discharge instructions given to Tammy Aguirre & daughter. All belongings with patient. Pt taken to car in wheelchair and driven home by daughter. All questions answered

## 2023-10-18 NOTE — Discharge Instructions (Signed)
 1. Ok to remove right groin dressing 10/19/23.  2. Ok to shower after dressing is removed but please do not submerge the site in water for 7 days.  3. Avoid bending, stooping or lifting greater than 10 lb for 2 weeks.  4. No driving for 2 weeks.  5. Please monitor right groin vascular site for signs of bleeding or infection (redness, warmth, pain).

## 2023-10-18 NOTE — Evaluation (Signed)
 Physical Therapy Evaluation Patient Details Name: Tammy Aguirre MRN: [id number] DOB: [birth year] Today's Date: 10/18/2023  History of Present Illness  Pt is a 63 y/o female presenting on 3/17 for elective 4 vessel cerebral arteriogram, R CFA approach due to multiple intra-cranial aneurysms.  PMH includes: HTN, migraines, TIA.  Clinical Impression  Patient presents with mobility limited due to generalized imbalance and decreased activity tolerance with headache and some blurry vision.  She will have help from her daughter at d/c and covered safe technique on stairs and discussed car transfers.  Feel stable for home without follow up PT as feel she should continue to improve over time and with daily activity.  No further skilled PT needs, PT will sign off.         If plan is discharge home, recommend the following: A little help with walking and/or transfers;Help with stairs or ramp for entrance   Can travel by private vehicle        Equipment Recommendations None recommended by PT  Recommendations for Other Services       Functional Status Assessment Patient has had a recent decline in their functional status and demonstrates the ability to make significant improvements in function in a reasonable and predictable amount of time.     Precautions / Restrictions Precautions Precautions: Fall Recall of Precautions/Restrictions: Intact Restrictions Weight Bearing Restrictions Per Provider Order: No      Mobility  Bed Mobility               General bed mobility comments: on EOB with OT    Transfers     Transfers: Sit to/from Stand Sit to Stand: Supervision           General transfer comment: for safety    Ambulation/Gait Ambulation/Gait assistance: Contact guard assist, Supervision Gait Distance (Feet): 225 Feet Assistive device: None Gait Pattern/deviations: Step-through pattern, Decreased stride length       General Gait Details: initially slow and with  decreased trunk rotation; progressed to improved speed and more normalized pattern over time; initially CGA for safety, progressing to S  Stairs Stairs: Yes Stairs assistance: Supervision, Contact guard assist Stair Management: One rail Right, Step to pattern, Forwards Number of Stairs: 10 General stair comments: daughter present and educated in how to assist. patient educated in step to pattern protecting R leg post procedure  Wheelchair Mobility     Tilt Bed    Modified Rankin (Stroke Patients Only)       Balance Overall balance assessment: Needs assistance   Sitting balance-Leahy Scale: Good       Standing balance-Leahy Scale: Good                 High Level Balance Comments: some limitations noted with slower speed and decreased truck rotation though pt moving without physical help             Pertinent Vitals/Pain Pain Assessment Pain Score: 3  Pain Location: headache, behind R eye Pain Descriptors / Indicators: Discomfort, Pressure, Headache Pain Intervention(s): Monitored during session    Home Living Family/patient expects to be discharged to:: Private residence Living Arrangements: Parent;Children Available Help at Discharge: Family;Available 24 hours/day Type of Home: House Home Access: Level entry     Alternate Level Stairs-Number of Steps: 12-15 Home Layout: Two level Home Equipment: None Additional Comments: daughter reports can set up pt in living room on 1st floor if needed    Prior Function Prior Level of Function : Independent/Modified Independent;Driving;Working/employed  ADLs Comments: payroll as occupation, driving; completely independent     Extremity/Trunk Assessment   Upper Extremity Assessment Upper Extremity Assessment: Overall WFL for tasks assessed    Lower Extremity Assessment Lower Extremity Assessment: Overall WFL for tasks assessed    Cervical / Trunk Assessment Cervical / Trunk Assessment:  Normal  Communication   Communication Communication: No apparent difficulties    Cognition Arousal: Alert Behavior During Therapy: WFL for tasks assessed/performed   PT - Cognitive impairments: No apparent impairments                         Following commands: Intact       Cueing Cueing Techniques: Verbal cues     General Comments General comments (skin integrity, edema, etc.): Educated in fall prevention and compensation for vision issues, initial assist for safety and balance    Exercises     Assessment/Plan    PT Assessment Patient does not need any further PT services  PT Problem List         PT Treatment Interventions      PT Goals (Current goals can be found in the Care Plan section)  Acute Rehab PT Goals PT Goal Formulation: All assessment and education complete, DC therapy    Frequency       Co-evaluation               AM-PAC PT "6 Clicks" Mobility  Outcome Measure Help needed turning from your back to your side while in a flat bed without using bedrails?: None Help needed moving from lying on your back to sitting on the side of a flat bed without using bedrails?: None Help needed moving to and from a bed to a chair (including a wheelchair)?: None Help needed standing up from a chair using your arms (e.g., wheelchair or bedside chair)?: None Help needed to walk in hospital room?: A Little Help needed climbing 3-5 steps with a railing? : A Little 6 Click Score: 22    End of Session Equipment Utilized During Treatment: Gait belt Activity Tolerance: Patient tolerated treatment well Patient left: in chair;with call bell/phone within reach;with family/visitor present   PT Visit Diagnosis: Other abnormalities of gait and mobility (R26.89)    Time: 1025-1100 PT Time Calculation (min) (ACUTE ONLY): 35 min   Charges:   PT Evaluation $PT Eval Low Complexity: 1 Low PT Treatments $Gait Training: 8-22 mins PT General Charges $$ ACUTE  PT VISIT: 1 Visit         Sheran Lawless, PT Acute Rehabilitation Services Office:[phone] 10/18/2023   Elray Mcgregor 10/18/2023, 11:34 AM

## 2023-10-18 NOTE — Discharge Summary (Addendum)
 Patient ID: Tammy Aguirre MRN: [id number] DOB/AGE: [birth year] 63 y.o.  Admit date: 10/17/2023 Discharge date: 10/18/2023  Supervising Physician: Julieanne Cotton  Patient Status: [facility] - In-pt  Admission Diagnoses: Cerebral aneurysm.   Discharge Diagnoses:  Principal Problem:   Cerebral aneurysm Active Problems:   Brain aneurysm   Discharged Condition: good  Hospital Course:  Tammy Aguirre is a 63 y.o. female with history of HTN, migraines, TIA recently found to have multiple intra-cranial aneurysms. She met with Dr. Corliss Skains to discuss right MCA bifurcation region aneurysm, right posterior communicating artery aneurysm, and left posterior communicating artery aneurysm. After discussion patient elected to proceed with intervention. She presented to the [facility] Neuro Interventional Radiology department 10/17/23 and underwent an image-guided diagnostic cerebral angiogram with endovascular treatment of an irregular trilobed right MCA trifurcation region aneurysm.   The procedure was performed under general anesthesia and tolerated the procedure well. Post-CT brain showed no intracranial hemorrhage. She was admitted to the Neuro ICU for a planned overnight observation. She was evaluated this morning by Dr. Corliss Skains and reported doing well with the exception of a headache and some blurred vision in her right eye. Dr. Corliss Skains is uncertain of the cause of her blurred vision but he performed a through exam and her visual fields are intact.   Dr. Corliss Skains ordered a P2Y12 prior to discharge to assess for Plavix response. She will also work with PT/OT  prior to discharge. She will follow up with Dr. Corliss Skains in approximately 2 weeks and a scheduler from our office will call her with a date/time of her appointment. She was advised to take her medications as directed. She was also advised to avoid driving, bending, stooping or lifting greater than 10 lb for two weeks.   Consults: None    Significant Diagnostic Studies: CT HEAD WO CONTRAST ( ) Result Date: 10/17/2023 CLINICAL DATA:  Follow-up examination for right aneurysm EXAM: CT HEAD WITHOUT CONTRAST TECHNIQUE: Contiguous axial images were obtained from the base of the skull through the vertex without intravenous contrast. RADIATION DOSE REDUCTION: This exam was performed according to the departmental dose-optimization program which includes automated exposure control, adjustment of the mA and/or kV according to patient size and/or use of iterative reconstruction technique. COMPARISON:  Prior studies from earlier the same day as well as earlier. FINDINGS: Brain: Cerebral volume within normal limits. No acute intracranial hemorrhage. No acute large vessel territory infarct. No mass lesion or midline shift. No hydrocephalus or extra-axial fluid collection. Vascular: No abnormal hyperdense vessel. Vascular stent in place within the distal right M1 segment. Skull: Scalp soft tissues demonstrate no acute finding. Calvarium intact. Sinuses/Orbits: Globes orbital soft tissues within normal limits. Paranasal sinuses are largely clear. No mastoid effusion. Other: None. IMPRESSION: 1. Interval pipeline placement at the distal right M1 segment/right MCA bifurcation for treatment of previously identified right MCA bifurcation aneurysm. No acute intracranial hemorrhage or other complication. 2. No other acute intracranial abnormality. Electronically Signed   By: Rise Mu M.D.   On: 10/17/2023 18:47   IR Radiologist Eval & Mgmt Result Date: 09/30/2023 EXAM: NEW PATIENT OFFICE VISIT CHIEF COMPLAINT: Headaches.  Recent discovery of multiple intracranial aneurysms. Current Pain Level: 1-10 HISTORY OF PRESENT ILLNESS: The patient is a 63 year old right handed lady who presents accompanied by her sister and daughter for evaluation of recently discovered multiple intracranial aneurysms. History was obtained from the patient, the history and  also the patient's electronic medical records. The patient was seen recently by her primary  MD for evaluation of new onset of bioccipital headaches. The first episode was about 3 weeks ago when the patient experienced sudden sharp pain in the occipital regions while in exercise class. There was no associated nausea or vomiting though she did feel sensation of nearly passing out. The patient had mild diaphoresis at the time. She denies any chest pain, shortness of breath or palpitations at this time. This was then followed by further episodes of sudden severe sharp pain in the occipital regions this time also involving the upper cervical region more towards the right of the midline. This also occurred while the patient was doing cycling exercises. This headache was associated with muscle spasm again with episode lasting about 5 minutes. This time she felt nauseated but had no vomiting. This is associated with lightheadedness but never passed out. Patient reports no blurred vision, diplopia, or blindness. There was no difficulty with balance also. No history of perioral numbness, or tinnitus or of gait imbalance. This prompted a CT angiogram of the head and neck which demonstrated 2 intracranial aneurysms in the right anterior circulation, and 1 in the left anterior circulation. She denies any chest pains, shortness of breath or of breathing difficulties or papilledema. No history of wheezing, coughing or sputum production. Patient reports her weight to be steady, appetite normal. Denies any abdominal pain, constipation, diarrhea or melena. No dysuria, hematuria, or polyuria. No chills, fever or rigors. Past history of hypertension, allergic rhinitis. History polyp in the colon. Past medical history of neck surgery for disc disease in 2011. Rotator cuff tear in 2023. Vein stripping in 2015. Hysterectomy for endometriosis in 2003. Medications: Amlodipine, irbesartan-hydrochlorothiazide. Allergies: Seasonal allergies.   Tetracycline. Social History: Nonsmoker.  Does not drink alcohol. Family History: No history of brain aneurysms as per patient. High blood pressure, cancer. REVIEW OF SYSTEMS: Negative unless as mentioned above. PHYSICAL EXAMINATION: Awake, alert, oriented to time, place, space. Speech and comprehension intact. Appears moderately distress. Normal eye contact. Neurologically no gross lateralizing neurological abnormalities evident. Station and gait normal. ASSESSMENT AND PLAN: The patient and the accompanying family members were informed of the CTA findings, revealing the presence of one aneurysm in the right MCA bifurcation region, and one in the right posterior communicating artery region, and one in the left posterior communicating artery region. The aneurysms at the maximum are about 5 mm on the right side and about 3 mm on the left side. The natural history of unruptured brain aneurysms was reviewed with patient and the family. The risk of rupture of 1-2% per year per aneurysm with the attendant severe morbidity and mortality complications in the event of a subarachnoid hemorrhage. The risk of rupture has been associated with hypertension, female gender and cigarette smoking which the patient denies. Also there is no family history of brain aneurysms. It was felt that the aneurysms were incidental, given the location of her headaches. It was unlikely that these were responsible for her headaches. Nevertheless, potential management considerations were those of obliteration of the aneurysms from the parent vessel, thereby reducing and eliminating the risk of growth and rupture of the aneurysms. The endovascular options of primary coiling, stent assisted coiling, flow diversion, and of intra saccular endovascular treatments were reviewed in detail. This would depend on the angio architecture of the brain aneurysms. Formal diagnostic catheter arteriogram via a trans radial or transfemoral route under conscious  sedation will be needed Depending on the morphology of the aneurysms, the most appropriate endovascular option would be decided upon.  For endovascular treatment the patient would be put under general anesthesia with the intent to retreat one or both aneurysms on the right side initially. Patient would have to be started on dual antiplatelets, aspirin 81 mg a day and Plavix 300 mg bolus dose followed by 75 mg per day starting 5-7 days prior to the procedure. A P2Y12 would be drawn in order to ensure adequate platelet inhibition. The risk of the endovascular procedure of 1-2% chance of an ischemic stroke with a remote chance of intra procedure rupture with attendant fatality was also reviewed. Also mentioned was the remote possibility of delayed intracranial hemorrhage. Questions were answered to their satisfaction. The patient would like to proceed with scheduled endovascular treatment under general anesthesia proceeded by a diagnostic arteriogram possibly with conscious sedation at the same time. Patient advised to continue with light exercises and maintaining adequate hydration and taking her antihypertensive medications. They were asked to call for concerns or questions. Electronically Signed   By: Julieanne Cotton M.D.   On: 09/30/2023 17:50   CT ANGIO HEAD W OR WO CONTRAST Result Date: 09/21/2023 CLINICAL DATA:  Exertional headache. Right MCA aneurysm shown by MR angiography EXAM: CT ANGIOGRAPHY HEAD TECHNIQUE: Multidetector CT imaging of the head was performed using the standard protocol during bolus administration of intravenous contrast. Multiplanar CT image reconstructions and MIPs were obtained to evaluate the vascular anatomy. RADIATION DOSE REDUCTION: This exam was performed according to the departmental dose-optimization program which includes automated exposure control, adjustment of the mA and/or kV according to patient size and/or use of iterative reconstruction technique. CONTRAST:  75mL  OMNIPAQUE IOHEXOL 350 MG/ML SOLN COMPARISON:  09/20/2023 FINDINGS: CT HEAD Brain: Normal appearance without evidence of old or acute infarction, mass lesion, hemorrhage, hydrocephalus or extra-axial collection. Vascular: No abnormal vascular finding on the standard head CT. Skull: Normal Sinuses: Clear Other: None CTA HEAD Anterior circulation: Both internal carotid arteries are patent through the skull base and siphon regions. On the right, there is a 3 x 5 mm posterior communicating artery aneurysm. There is a lobular 3-5 mm aneurysm at the right MCA bifurcation. On the left, there is a 3 mm posterior communicating artery aneurysm. No left anterior or middle cerebral artery aneurysm. Posterior circulation: Both vertebral arteries are widely patent through the foramen magnum to the basilar artery. No basilar stenosis. Posterior circulation branch vessels are normal. Venous sinuses: Patent and normal. Anatomic variants: None other significant. Review of the MIP images confirms the above findings. IMPRESSION: 1. 3 x 5 mm right posterior communicating artery aneurysm. 2. 3 mm left posterior communicating artery aneurysm. 3. 3-5 mm lobular aneurysm at the right MCA bifurcation. 4. No intracranial large vessel occlusion or correctable stenosis. 5. Neuro endovascular consultation recommended for consideration of treatment given that the patient is symptomatic. Electronically Signed   By: Paulina Fusi M.D.   On: 09/21/2023 16:55   MR ANGIO HEAD WO CONTRAST Result Date: 09/20/2023 CLINICAL DATA:  Exertional occipital headache EXAM: MRA HEAD WITHOUT CONTRAST TECHNIQUE: Angiographic images of the Circle of Willis were acquired using MRA technique without intravenous contrast. COMPARISON:  MRI report from 03/22/2001 FINDINGS: Both internal carotid arteries are widely patent into the brain. No siphon stenosis. The anterior and middle cerebral vessels are patent without stenosis or occlusion. There appears to be a 3-4 mm  aneurysm at the right MCA bifurcation. CT angiography or catheter angiography would be recommended for more accurate characterization. Both vertebral arteries are widely patent to the basilar. No basilar  stenosis. Posterior circulation branch vessels appear normal. IMPRESSION: 1. 3-4 mm aneurysm at the right MCA bifurcation. CT angiography or catheter angiography would be recommended for more accurate characterization. 2. No other abnormality. 3. Report will be called upon office opening. Electronically Signed   By: Paulina Fusi M.D.   On: 09/20/2023 18:19    Treatments: Observation   Discharge Exam: Blood pressure 127/76, pulse 79, temperature 98.5 F (36.9 C), temperature source Oral, resp. rate 20, height 5\' 1"  (1.549 m), weight 127 lb 13.9 oz (58 kg), SpO2 100%. Physical Exam Constitutional:      General: She is not in acute distress.    Appearance: She is not ill-appearing.  Cardiovascular:     Rate and Rhythm: Normal rate and regular rhythm.     Comments: Right groin vascular site is clean, soft, dry and only mildly tender. Left radial arterial line site is covered in gauze/tape.  Pulmonary:     Effort: Pulmonary effort is normal.  Musculoskeletal:     Right lower leg: No edema.     Left lower leg: No edema.  Skin:    General: Skin is warm and dry.  Neurological:     Mental Status: She is alert and oriented to person, place, and time.     Cranial Nerves: No dysarthria or facial asymmetry.     Motor: No weakness.     Comments: Blurred vision in right eye. Visual fields intact.   Psychiatric:        Mood and Affect: Mood normal.        Behavior: Behavior normal.        Thought Content: Thought content normal.        Judgment: Judgment normal.     Disposition: Discharge disposition: 01-Home or Self Care       Allergies as of 10/18/2023       Reactions   Tetracyclines & Related Rash        Medication List     TAKE these medications    aspirin EC 81 MG  tablet Take 81 mg by mouth daily. Swallow whole.   clopidogrel 75 MG tablet Commonly known as: Plavix Take 1 tablet (75 mg total) by mouth daily.   fexofenadine 180 MG tablet Commonly known as: ALLEGRA Take 180 mg by mouth daily.   finasteride 5 MG tablet Commonly known as: PROSCAR Take 5 mg by mouth daily.   irbesartan-hydrochlorothiazide 150-12.5 MG tablet Commonly known as: AVALIDE Take 1 tablet by mouth every evening.   OVER THE COUNTER MEDICATION Take 4 capsules by mouth daily. Nutrafol supplement   triamcinolone ointment 0.1 % Commonly known as: KENALOG Apply 1 Application topically 2 (two) times daily as needed (rash).   VITAMIN D PO Take 1,000-2,000 Units by mouth daily. liquid          Electronically Signed: Mickie Kay, NP 10/18/2023, 10:06 AM   I have spent Less Than 30 Minutes discharging Tammy Aguirre.

## 2023-10-18 NOTE — Progress Notes (Signed)
 PHARMACY - ANTICOAGULATION CONSULT NOTE  Pharmacy Consult for heparin Indication:  s/p neuro IR arteriogram  Labs: Recent Labs    10/17/23 0620 10/17/23 1948 10/18/23 0305  HGB 10.9*  --   --   HCT 33.4*  --   --   PLT 154  --   --   LABPROT 13.8  --   --   INR 1.0  --   --   HEPARINUNFRC  --  0.29* 0.12*  CREATININE 1.00  --   --    Assessment/Plan:  63yo female therapeutic on heparin after rate change. Will continue infusion at current rate of 450 units/hr until off in am.  Vernard Gambles, PharmD, BCPS 10/18/2023 3:30 AM

## 2023-10-18 NOTE — Progress Notes (Addendum)
 Occupational Therapy Evaluation Patient Details Name: Tammy Aguirre MRN: [id number] DOB: 07-09-1961 Today's Date: 10/18/2023   History of Present Illness   Pt is a 63 y/o female presenting on 3/17 for elective 4 vessel cerebral arteriogram, R CFA approach due to multiple intra-cranial aneurysms.  PMH includes: HTN, migraines, TIA.     Clinical Impressions PTA patient independent, working and driving. Admitted for above and presents with problem list below. She is completing functional mobility and transfers with min guard fading to supervision, guarded mobility initially.  Completes ADLs with up to min guard assist, discussed compensatory strategies for ADLs and safety.  Mostly limited by blurry vision in R visual field, see below for details.  Recommend follow up with outpatient OT for vision, IADLs and return to work, and educated on compensatory strategies for safety/ADLs at this time.  Will follow acutely.      If plan is discharge home, recommend the following:   A little help with bathing/dressing/bathroom;Assist for transportation;Assistance with cooking/housework     Functional Status Assessment   Patient has had a recent decline in their functional status and demonstrates the ability to make significant improvements in function in a reasonable and predictable amount of time.     Equipment Recommendations   None recommended by OT     Recommendations for Other Services         Precautions/Restrictions   Precautions Precautions: Fall Recall of Precautions/Restrictions: Intact Restrictions Weight Bearing Restrictions Per Provider Order: No     Mobility Bed Mobility               General bed mobility comments: OOB upon entry    Transfers Overall transfer level: Needs assistance   Transfers: Sit to/from Stand Sit to Stand: Contact guard assist           General transfer comment: for safety, mild unsteadiness \      Balance Overall  balance assessment: Mild deficits observed, not formally tested                                         ADL either performed or assessed with clinical judgement   ADL Overall ADL's : Needs assistance/impaired     Grooming: Supervision/safety;Standing           Upper Body Dressing : Set up;Sitting   Lower Body Dressing: Contact guard assist;Sit to/from stand   Toilet Transfer: Contact guard assist;Ambulation   Toileting- Clothing Manipulation and Hygiene: Supervision/safety;Sit to/from stand       Functional mobility during ADLs: Contact guard assist General ADL Comments: pt in bathroom upon entry, min guard for mobility for safety with mild unsteadiness/guarding     Vision Baseline Vision/History: 1 Wears glasses Ability to See in Adequate Light: 0 Adequate Patient Visual Report: Blurring of vision (area of R visual field only) Vision Assessment?: Yes Eye Alignment: Within Functional Limits Ocular Range of Motion: Within Functional Limits Alignment/Gaze Preference: Within Defined Limits Tracking/Visual Pursuits: Able to track stimulus in all quads without difficulty Visual Fields: No apparent deficits Diplopia Assessment:  (functionally WFL, no formally assessed but not over/undershooting) Additional Comments: pt reports blurry vision in R eye (lateral half towards R side); when covering L eye, R eye is "more blurry" but when covering R eye, L eye is normal.  Pt able to scan in room without difficulty. Some difficulty reading at distance and reading phone due  to blurriness. Discussed compensatory techniques for safety at this time.     Perception         Praxis         Pertinent Vitals/Pain Pain Assessment Pain Assessment: 0-10 Pain Score: 2  Pain Location: headache, behind R eye Pain Descriptors / Indicators: Discomfort, Pressure, Headache Pain Intervention(s): Limited activity within patient's tolerance, Monitored during session, Repositioned      Extremity/Trunk Assessment Upper Extremity Assessment Upper Extremity Assessment: Overall WFL for tasks assessed   Lower Extremity Assessment Lower Extremity Assessment: Defer to PT evaluation   Cervical / Trunk Assessment Cervical / Trunk Assessment: Normal   Communication Communication Communication: No apparent difficulties   Cognition Arousal: Alert Behavior During Therapy: WFL for tasks assessed/performed Cognition: No apparent impairments                               Following commands: Intact       Cueing  General Comments   Cueing Techniques: Verbal cues  discussed compensatory technqiues and fall prevention techniques due to blurry vision, recommendations to follow up with outpatinet OT and get a full visual field assessment from opthamologist   Exercises     Shoulder Instructions      Home Living Family/patient expects to be discharged to:: Private residence Living Arrangements: Parent Available Help at Discharge: Family;Available 24 hours/day Type of Home: House Home Access: Level entry     Home Layout: Two level Alternate Level Stairs-Number of Steps: 12-15 Alternate Level Stairs-Rails: Right Bathroom Shower/Tub: Chief Strategy Officer: Standard     Home Equipment: None   Additional Comments: daughter reports can set up pt in living room on 1st floor if needed      Prior Functioning/Environment Prior Level of Function : Independent/Modified Independent;Driving;Working/employed               ADLs Comments: payroll as occupation, driving; completely independent    OT Problem List: Decreased knowledge of precautions;Decreased knowledge of use of DME or AE;Impaired vision/perception   OT Treatment/Interventions: Self-care/ADL training;Therapeutic exercise;DME and/or AE instruction;Therapeutic activities;Visual/perceptual remediation/compensation;Patient/family education      OT Goals(Current goals can be  found in the care plan section)   Acute Rehab OT Goals Patient Stated Goal: be able to see normally OT Goal Formulation: With patient Time For Goal Achievement: 11/01/23 Potential to Achieve Goals: Good   OT Frequency:  Min 2X/week    Co-evaluation              AM-PAC OT "6 Clicks" Daily Activity     Outcome Measure Help from another person eating meals?: None Help from another person taking care of personal grooming?: A Little Help from another person toileting, which includes using toliet, bedpan, or urinal?: A Little Help from another person bathing (including washing, rinsing, drying)?: A Little Help from another person to put on and taking off regular upper body clothing?: A Little Help from another person to put on and taking off regular lower body clothing?: A Little 6 Click Score: 19   End of Session Equipment Utilized During Treatment: Gait belt Nurse Communication: Mobility status;Other (comment) (dc recs)  Activity Tolerance: Patient tolerated treatment well Patient left: Other (comment) (EOB with PT)  OT Visit Diagnosis: Other abnormalities of gait and mobility (R26.89)                Time: 1000-1020 OT Time Calculation (min): 20 min Charges:  OT  General Charges $OT Visit: 1 Visit OT Evaluation $OT Eval Low Complexity: 1 Low  Barry Brunner, OT Acute Rehabilitation Services Office [phone] Secure Chat Preferred    Chancy Milroy 10/18/2023, 10:41 AM

## 2023-10-21 ENCOUNTER — Other Ambulatory Visit (HOSPITAL_COMMUNITY): Payer: Self-pay | Admitting: Interventional Radiology

## 2023-10-21 ENCOUNTER — Encounter (HOSPITAL_COMMUNITY): Payer: Self-pay

## 2023-10-21 DIAGNOSIS — I671 Cerebral aneurysm, nonruptured: Secondary | ICD-10-CM

## 2023-10-21 HISTORY — PX: IR ANGIOGRAM FOLLOW UP STUDY: IMG697

## 2023-10-21 HISTORY — PX: IR NEURO EACH ADD'L AFTER BASIC UNI RIGHT (MS): IMG5374

## 2023-10-24 ENCOUNTER — Telehealth (HOSPITAL_COMMUNITY): Payer: Self-pay | Admitting: Student

## 2023-10-24 ENCOUNTER — Other Ambulatory Visit (HOSPITAL_COMMUNITY): Payer: Self-pay | Admitting: Interventional Radiology

## 2023-10-24 DIAGNOSIS — R1031 Right lower quadrant pain: Secondary | ICD-10-CM

## 2023-10-24 NOTE — Telephone Encounter (Signed)
 Patient is Status post endovascular treatment of irregular trilobed right MCA trifurcation region aneurysm by Dr. Corliss Skains 10/17/23. She was discharged home the next day. She called IR today with concerns about the right groin vascular site being tender, slightly swollen and the presence of a "knot". Per Dr. Corliss Skains a right femoral vascular ultrasound has been ordered for further assessment.   Patient scheduled today for this imaging study. NIR will await these results.   Alwyn Ren, AGACNP-BC 10/24/2023, 12:13 PM

## 2023-10-25 ENCOUNTER — Encounter: Admitting: Occupational Therapy

## 2023-10-25 ENCOUNTER — Ambulatory Visit (HOSPITAL_COMMUNITY)
Admission: RE | Admit: 2023-10-25 | Discharge: 2023-10-25 | Disposition: A | Discharge status: Home / Self Care | Source: Ambulatory Visit | Attending: Interventional Radiology | Admitting: Interventional Radiology

## 2023-10-25 DIAGNOSIS — R1031 Right lower quadrant pain: Secondary | ICD-10-CM | POA: Diagnosis not present

## 2023-10-26 ENCOUNTER — Telehealth (HOSPITAL_COMMUNITY): Payer: Self-pay | Admitting: Student

## 2023-10-26 LAB — POCT ACTIVATED CLOTTING TIME
Activated Clotting Time: 204 s
Activated Clotting Time: 210 s
Activated Clotting Time: 233 s

## 2023-10-26 NOTE — Telephone Encounter (Signed)
 Right groin vascular study was negative for pseudoaneurysm, DVT or AVF. I called the patient to notify her of these results and she stated the pain at the site is lessening. We discussed using ice and ibuprofen as needed, as well as splinting the right groin site with two fingers when she is lifting the right leg or bending down.   She is aware of her follow up with Dr. Corliss Skains on 11/03/23 at 1 pm. She knows to call the office prior to that visit if she has worsening pain at the groin site. She also requested short term disability paperwork to be signed and she is faxing that to the IR APP office.   Alwyn Ren, AGACNP-BC 10/26/2023, 10:22 AM

## 2023-11-03 ENCOUNTER — Ambulatory Visit (HOSPITAL_COMMUNITY)
Admission: RE | Admit: 2023-11-03 | Discharge: 2023-11-03 | Disposition: A | Discharge status: Home / Self Care | Source: Ambulatory Visit | Attending: Radiology | Admitting: Radiology

## 2023-11-03 DIAGNOSIS — I671 Cerebral aneurysm, nonruptured: Secondary | ICD-10-CM

## 2023-11-03 DIAGNOSIS — M7989 Other specified soft tissue disorders: Secondary | ICD-10-CM | POA: Diagnosis not present

## 2023-11-04 ENCOUNTER — Telehealth: Payer: Self-pay

## 2023-11-04 HISTORY — PX: IR RADIOLOGIST EVAL & MGMT: IMG5224

## 2023-11-28 ENCOUNTER — Other Ambulatory Visit (HOSPITAL_COMMUNITY): Payer: Self-pay | Admitting: Interventional Radiology

## 2023-11-28 DIAGNOSIS — I671 Cerebral aneurysm, nonruptured: Secondary | ICD-10-CM

## 2023-11-30 ENCOUNTER — Telehealth (HOSPITAL_COMMUNITY): Payer: Self-pay

## 2023-11-30 ENCOUNTER — Other Ambulatory Visit (HOSPITAL_COMMUNITY): Payer: Self-pay

## 2023-11-30 ENCOUNTER — Other Ambulatory Visit (HOSPITAL_COMMUNITY)
Admission: RE | Admit: 2023-11-30 | Discharge: 2023-11-30 | Disposition: A | Discharge status: Home / Self Care | Attending: Interventional Radiology | Admitting: Interventional Radiology

## 2023-11-30 ENCOUNTER — Other Ambulatory Visit: Payer: Self-pay | Admitting: Student

## 2023-11-30 ENCOUNTER — Other Ambulatory Visit: Payer: Self-pay | Admitting: Radiology

## 2023-11-30 DIAGNOSIS — I671 Cerebral aneurysm, nonruptured: Secondary | ICD-10-CM

## 2023-11-30 DIAGNOSIS — I729 Aneurysm of unspecified site: Secondary | ICD-10-CM

## 2023-11-30 LAB — PLATELET INHIBITION P2Y12: Platelet Function  P2Y12: 2 [PRU] — ABNORMAL LOW (ref 182–335)

## 2023-11-30 NOTE — Telephone Encounter (Signed)
 Pt will come for a repeat p2y12 on 5/2 before Dr. Alvira Josephs makes any changes. Pt is aware. AB

## 2023-12-01 ENCOUNTER — Encounter (HOSPITAL_COMMUNITY): Payer: Self-pay | Admitting: Interventional Radiology

## 2023-12-01 ENCOUNTER — Other Ambulatory Visit: Payer: Self-pay

## 2023-12-01 NOTE — Progress Notes (Signed)
 PCP - Merl Star, MD  Cardiologist -   PPM/ICD - denies Device Orders - n/a Rep Notified - n/a  Chest x-ray -  EKG - 10-17-23 Stress Test -  ECHO -  Cardiac Cath -   DM -denies  Blood Thinner Instructions: Plavix  continue Aspirin  Instructions: Continue  ERAS Protcol - NPO per patient instructions given to her by surgeon office  COVID TEST- n/a  Anesthesia review: no  Patient verbally denies any shortness of breath, fever, cough and chest pain during phone call   -------------  SDW INSTRUCTIONS given:  Your procedure is scheduled on Dec 05, 2023.  Report to [facility] Main Entrance "A" at 5:30 A.M., and check in at the Admitting office.  Call this number if you have problems the morning of surgery:  [phone]   Remember:  Do not eat after midnight the night before your surgery  You may drink clear liquids until 4:30 the morning of your surgery.   Clear liquids allowed are: Water, Non-Citrus Juices (without pulp), Carbonated Beverages, Clear Tea, Black Coffee Only, and Gatorade    Take these medicines the morning of surgery with A SIP OF WATER  fexofenadine (ALLEGRA)   finasteride (PROSCAR)    FOLLOW INSTRUCTIONS GIVEN TO YOU BY YOUR SURGEON FOR FOLLOWING MEDICATIONS: clopidogrel  (PLAVIX )  aspirin    As of today, STOP taking any Aspirin  (unless otherwise instructed by your surgeon) Aleve, Naproxen, Ibuprofen, Motrin, Advil, Goody's, BC's, all herbal medications, fish oil, and all vitamins.                      Do not wear jewelry, make up, or nail polish            Do not wear lotions, powders, perfumes/colognes, or deodorant.            Do not shave 48 hours prior to surgery.  Men may shave face and neck.            Do not bring valuables to the hospital.            [facility] is not responsible for any belongings or valuables.  Do NOT Smoke (Tobacco/Vaping) 24 hours prior to your procedure If you use a CPAP at night, you may bring all equipment for  your overnight stay.   Contacts, glasses, dentures or bridgework may not be worn into surgery.      For patients admitted to the hospital, discharge time will be determined by your treatment team.   Patients discharged the day of surgery will not be allowed to drive home, and someone needs to stay with them for 24 hours.    Special instructions:   [facility]- Preparing For Surgery  Before surgery, you can play an important role. Because skin is not sterile, your skin needs to be as free of germs as possible. You can reduce the number of germs on your skin by washing with CHG (chlorahexidine gluconate) Soap before surgery.  CHG is an antiseptic cleaner which kills germs and bonds with the skin to continue killing germs even after washing.    Oral Hygiene is also important to reduce your risk of infection.  Remember - BRUSH YOUR TEETH THE MORNING OF SURGERY WITH YOUR REGULAR TOOTHPASTE  Please do not use if you have an allergy to CHG or antibacterial soaps. If your skin becomes reddened/irritated stop using the CHG.  Do not shave (including legs and underarms) for at least 48 hours prior to first  CHG shower. It is OK to shave your face.  Please follow these instructions carefully.   Shower the NIGHT BEFORE SURGERY and the MORNING OF SURGERY with DIAL Soap.   Pat yourself dry with a CLEAN TOWEL.  Wear CLEAN PAJAMAS to bed the night before surgery  Place CLEAN SHEETS on your bed the night of your first shower and DO NOT SLEEP WITH PETS.   Day of Surgery: Please shower morning of surgery  Wear Clean/Comfortable clothing the morning of surgery Do not apply any deodorants/lotions.   Remember to brush your teeth WITH YOUR REGULAR TOOTHPASTE.   Questions were answered. Patient verbalized understanding of instructions.

## 2023-12-02 ENCOUNTER — Telehealth: Payer: Self-pay | Admitting: Radiology

## 2023-12-02 ENCOUNTER — Other Ambulatory Visit: Payer: Self-pay | Admitting: Radiology

## 2023-12-02 ENCOUNTER — Other Ambulatory Visit (HOSPITAL_COMMUNITY)
Admission: RE | Admit: 2023-12-02 | Discharge: 2023-12-02 | Disposition: A | Discharge status: Home / Self Care | Source: Ambulatory Visit | Attending: Student | Admitting: Student

## 2023-12-02 ENCOUNTER — Other Ambulatory Visit: Payer: Self-pay | Admitting: Student

## 2023-12-02 DIAGNOSIS — I10 Essential (primary) hypertension: Secondary | ICD-10-CM | POA: Diagnosis not present

## 2023-12-02 DIAGNOSIS — I671 Cerebral aneurysm, nonruptured: Secondary | ICD-10-CM

## 2023-12-02 DIAGNOSIS — Z95828 Presence of other vascular implants and grafts: Secondary | ICD-10-CM | POA: Diagnosis not present

## 2023-12-02 DIAGNOSIS — Z7902 Long term (current) use of antithrombotics/antiplatelets: Secondary | ICD-10-CM | POA: Diagnosis not present

## 2023-12-02 DIAGNOSIS — Z7982 Long term (current) use of aspirin: Secondary | ICD-10-CM | POA: Diagnosis not present

## 2023-12-02 DIAGNOSIS — G43909 Migraine, unspecified, not intractable, without status migrainosus: Secondary | ICD-10-CM | POA: Diagnosis not present

## 2023-12-02 DIAGNOSIS — Z90710 Acquired absence of both cervix and uterus: Secondary | ICD-10-CM | POA: Diagnosis not present

## 2023-12-02 DIAGNOSIS — T45515A Adverse effect of anticoagulants, initial encounter: Secondary | ICD-10-CM | POA: Diagnosis not present

## 2023-12-02 DIAGNOSIS — Z8673 Personal history of transient ischemic attack (TIA), and cerebral infarction without residual deficits: Secondary | ICD-10-CM | POA: Diagnosis not present

## 2023-12-02 DIAGNOSIS — Z7901 Long term (current) use of anticoagulants: Secondary | ICD-10-CM | POA: Diagnosis not present

## 2023-12-02 DIAGNOSIS — D6832 Hemorrhagic disorder due to extrinsic circulating anticoagulants: Secondary | ICD-10-CM | POA: Diagnosis not present

## 2023-12-02 DIAGNOSIS — H9221 Otorrhagia, right ear: Secondary | ICD-10-CM | POA: Diagnosis not present

## 2023-12-02 DIAGNOSIS — H1131 Conjunctival hemorrhage, right eye: Secondary | ICD-10-CM | POA: Diagnosis not present

## 2023-12-02 LAB — PLATELET INHIBITION P2Y12: Platelet Function  P2Y12: 11 [PRU] — ABNORMAL LOW (ref 182–335)

## 2023-12-02 NOTE — Telephone Encounter (Signed)
   IR Note  Pt is scheduled for intervention with Dr Alvira Josephs Monday 12/05/23 R PCOM aneurysm embolization Takes Plavix  daily P2y12  11 today Discussed with Dr Alvira Josephs:  Plan:  skip Plavix  dose Sat 5/3                                                              Restart Plavix  5/4                                                              Continue Pavix 5/5                                                              Procedure planned for Monday 12/05/23  Pt is aware of plan Agreeable; has good understnading of instructions

## 2023-12-04 ENCOUNTER — Encounter (HOSPITAL_COMMUNITY): Payer: Self-pay | Admitting: Radiology

## 2023-12-04 NOTE — H&P (Shared)
 Chief Complaint: Patient was seen in consultation today for right posterior communicating artery aneurysm  Referring Physician(s): Dr. Merl Star  Supervising Physician: Luellen Sages  Patient Status: [facility] - Out-pt  History of Present Illness: Tammy Aguirre is a 62 y.o. female known to Interventional Neuroradiology from recent uneventful treatment of her complex unruptured R MCA bifurcation aneurysm using a pipeline flow diverter 10/17/23.  She recovered well from this procedure with only minimal groin procedure site tenderness without evidence of complication.  She continues to have intermittent visual field disurbances.  She presents today for planned, elective right posterior communicating artery aneurysm.   Tammy Aguirre presents today in her usual state of health.  She ***.   She did have serial P2Y12 values obtained last week both of which showed hyperreponsiveness.  Medication adjustements were made over the weekend which she has adhered to. *** She is NPO.    Past Medical History:  Diagnosis Date   Anemia    History of   Arthritis    Right Shoulder   History of TIA (transient ischemic attack)    Hypertension    Migraines    History of   PONV (postoperative nausea and vomiting)     Past Surgical History:  Procedure Laterality Date   ABDOMINAL HYSTERECTOMY     CESAREAN SECTION     x 1   COLONOSCOPY     IR 3D INDEPENDENT WKST  10/17/2023   IR ANGIO VERTEBRAL SEL VERTEBRAL BILAT MOD SED  10/17/2023   IR ANGIOGRAM FOLLOW UP STUDY  10/21/2023   IR CT HEAD LTD  10/17/2023   IR NEURO EACH ADD'L AFTER BASIC UNI RIGHT (MS)  10/21/2023   IR RADIOLOGIST EVAL & MGMT  09/30/2023   MRI   IR RADIOLOGIST EVAL & MGMT  11/04/2023   IR TRANSCATH/EMBOLIZ  10/17/2023   NECK SURGERY     plate in neck   RADIOLOGY WITH ANESTHESIA N/A 10/17/2023   Procedure: RADIOLOGY WITH ANESTHESIA;  Surgeon: Luellen Sages, MD;  Location: [facility] OR;  Service: Radiology;  Laterality: N/A;   Angiogram with intent to treat   SHOULDER ARTHROSCOPY WITH OPEN ROTATOR CUFF REPAIR AND DISTAL CLAVICLE ACROMINECTOMY Right 11/24/2021   Procedure: RIGHT SHOULDER ARTHROSCOPIC SUBACROMIAL DECOMPRESSION WITH OPEN ROTATOR CUFF REPAIR AND DISTAL CLAVICLE ACROMINECTOMY;  Surgeon: Shirlee Dotter, MD;  Location: [facility] ORS;  Service: Orthopedics;  Laterality: Right;   UPPER GI ENDOSCOPY      Allergies: Tetracyclines & related  Medications: Prior to Admission medications   Medication Sig Start Date End Date Taking? Authorizing Provider  aspirin  EC 81 MG tablet Take 81 mg by mouth daily. Swallow whole.    [provider]  Bacillus Coagulans-Inulin (ALIGN PREBIOTIC-PROBIOTIC PO) Take by mouth.    [provider]  clopidogrel  (PLAVIX ) 75 MG tablet Take 1 tablet (75 mg total) by mouth daily. 10/04/23 01/02/24  Covington, Jamie R, NP  fexofenadine (ALLEGRA) 180 MG tablet Take 180 mg by mouth daily as needed for allergies.    [provider]  finasteride (PROSCAR) 5 MG tablet Take 5 mg by mouth daily. 11/07/17   [provider]  irbesartan-hydrochlorothiazide (AVALIDE) 150-12.5 MG tablet Take 1 tablet by mouth every evening. 10/19/17   [provider]  OVER THE COUNTER MEDICATION Take 4 tablets by mouth daily. Nutrafol supplement    [provider]  triamcinolone ointment (KENALOG) 0.1 % Apply 1 Application topically 2 (two) times daily as needed (rash). 07/05/23   [provider]  VITAMIN D PO Take 3 drops by mouth daily. liquid    [provider]     No family history on file.  Social History   Socioeconomic History   Marital status: Legally Separated    Spouse name: Not on file   Number of children: Not on file   Years of education: Not on file   Highest education level: Not on file  Occupational History   Not on file  Tobacco Use   Smoking status: Never   Smokeless tobacco: Never  Vaping Use   Vaping status: Never Used   Substance and Sexual Activity   Alcohol use: No   Drug use: No   Sexual activity: Yes    Birth control/protection: Surgical    Comment: Hysterectomy  Other Topics Concern   Not on file  Social History Narrative   Not on file   Social Drivers of Health   Financial Resource Strain: Not on file  Food Insecurity: No Food Insecurity (10/18/2023)   Hunger Vital Sign    Worried About Running Out of Food in the Last Year: Never true    Ran Out of Food in the Last Year: Never true  Transportation Needs: No Transportation Needs (10/18/2023)   PRAPARE - Administrator, Civil Service (Medical): No    Lack of Transportation (Non-Medical): No  Physical Activity: Not on file  Stress: Not on file  Social Connections: Not on file     Review of Systems: A 12 point ROS discussed and pertinent positives are indicated in the HPI above.  All other systems are negative.  Review of Systems  Vital Signs: There were no vitals taken for this visit.  Physical Exam       Imaging: No results found.  Labs:  CBC: Recent Labs    10/17/23 0620 10/18/23 0516  WBC 5.9 5.3  HGB 10.9* 9.4*  HCT 33.4* 28.9*  PLT 154 127*    COAGS: Recent Labs    10/17/23 0620  INR 1.0    BMP: Recent Labs    10/17/23 0620 10/18/23 0516  NA 140 141  K 3.9 3.2*  CL 108 110  CO2 25 22  GLUCOSE 96 97  BUN 25* 12  CALCIUM 9.1 8.0*  CREATININE 1.00 0.78  GFRNONAA >60 >60    LIVER FUNCTION TESTS: No results for input(s): "BILITOT", "AST", "ALT", "ALKPHOS", "PROT", "ALBUMIN" in the last 8760 hours.  TUMOR MARKERS: No results for input(s): "AFPTM", "CEA", "CA199", "CHROMGRNA" in the last 8760 hours.  Assessment and Plan: Right posterior communicating artery aneurysm Patient with past medical history of L MCA bifurcation aneurysm treatment presents for additional intervention of her multiple intra-cranial aneurysms.  Goal of the procedure today will be irradication of her right  posterior communicating artery aneurysm.   Patient has discussed and determine treatment stategy with Dr. Alvira Josephs.  Patient presents today in their usual state of health.  She has been NPO and is not currently on blood thinners.  She took Plavix  *** She is aware she will be admitted overnight.    Risks and benefits of cerebral angiogram with embolization vs. Stent placement with intervention were discussed with the patient including, but not limited to bleeding, infection, vascular injury, contrast induced renal failure, stroke or even death.  This interventional procedure involves the use of X-rays and because of the nature of the planned procedure, it is possible that we will have prolonged use of X-ray fluoroscopy.  Potential radiation risks to you  include (but are not limited to) the following: - A slightly elevated risk for cancer  several years later in life. This risk is typically less than 0.5% percent. This risk is low in comparison to the normal incidence of human cancer, which is 33% for women and 50% for men according to the [employer] Society. - Radiation induced injury can include skin redness, resembling a rash, tissue breakdown / ulcers and hair loss (which can be temporary or permanent).   The likelihood of either of these occurring depends on the difficulty of the procedure and whether you are sensitive to radiation due to previous procedures, disease, or genetic conditions.   IF your procedure requires a prolonged use of radiation, you will be notified and given written instructions for further action.  It is your responsibility to monitor the irradiated area for the 2 weeks following the procedure and to notify your physician if you are concerned that you have suffered a radiation induced injury.    All of the patient's questions were answered, patient is agreeable to proceed.  Consent signed and in chart.   Thank you for this interesting consult.  I greatly  enjoyed meeting Tammy Aguirre and look forward to participating in their care.  A copy of this report was sent to the requesting provider on this date.  Electronically Signed: Almyra Birman Sue-Ellen Lonell Stamos, PA 12/04/2023, 1:46 PM   I spent a total of  30 Minutes   in face to face in clinical consultation, greater than 50% of which was counseling/coordinating care for right posterior communicating artery aneurysm.

## 2023-12-05 ENCOUNTER — Inpatient Hospital Stay (HOSPITAL_COMMUNITY)
Admission: RE | Admit: 2023-12-05 | Discharge: 2023-12-06 | DRG: 026 | Disposition: A | Discharge status: Home / Self Care | Attending: Interventional Radiology | Admitting: Interventional Radiology

## 2023-12-05 ENCOUNTER — Encounter (HOSPITAL_COMMUNITY)
Admission: RE | Disposition: A | Discharge status: Home / Self Care | Payer: Self-pay | Source: Home / Self Care | Attending: Interventional Radiology

## 2023-12-05 ENCOUNTER — Observation Stay (HOSPITAL_COMMUNITY): Payer: Self-pay | Admitting: Anesthesiology

## 2023-12-05 ENCOUNTER — Encounter (HOSPITAL_COMMUNITY): Payer: Self-pay | Admitting: Interventional Radiology

## 2023-12-05 ENCOUNTER — Observation Stay (HOSPITAL_COMMUNITY)
Admission: RE | Admit: 2023-12-05 | Discharge: 2023-12-05 | Disposition: A | Discharge status: Home / Self Care | Source: Ambulatory Visit | Attending: Interventional Radiology | Admitting: Interventional Radiology

## 2023-12-05 DIAGNOSIS — Z95828 Presence of other vascular implants and grafts: Secondary | ICD-10-CM | POA: Diagnosis not present

## 2023-12-05 DIAGNOSIS — H1131 Conjunctival hemorrhage, right eye: Secondary | ICD-10-CM | POA: Diagnosis not present

## 2023-12-05 DIAGNOSIS — Z7901 Long term (current) use of anticoagulants: Secondary | ICD-10-CM

## 2023-12-05 DIAGNOSIS — Z7902 Long term (current) use of antithrombotics/antiplatelets: Secondary | ICD-10-CM

## 2023-12-05 DIAGNOSIS — Z90710 Acquired absence of both cervix and uterus: Secondary | ICD-10-CM | POA: Diagnosis not present

## 2023-12-05 DIAGNOSIS — Z8673 Personal history of transient ischemic attack (TIA), and cerebral infarction without residual deficits: Secondary | ICD-10-CM

## 2023-12-05 DIAGNOSIS — D6832 Hemorrhagic disorder due to extrinsic circulating anticoagulants: Secondary | ICD-10-CM | POA: Diagnosis not present

## 2023-12-05 DIAGNOSIS — I671 Cerebral aneurysm, nonruptured: Secondary | ICD-10-CM | POA: Diagnosis not present

## 2023-12-05 DIAGNOSIS — H9221 Otorrhagia, right ear: Secondary | ICD-10-CM | POA: Diagnosis not present

## 2023-12-05 DIAGNOSIS — T45515A Adverse effect of anticoagulants, initial encounter: Secondary | ICD-10-CM | POA: Diagnosis not present

## 2023-12-05 DIAGNOSIS — G43909 Migraine, unspecified, not intractable, without status migrainosus: Secondary | ICD-10-CM | POA: Diagnosis present

## 2023-12-05 DIAGNOSIS — Z7982 Long term (current) use of aspirin: Secondary | ICD-10-CM | POA: Diagnosis not present

## 2023-12-05 DIAGNOSIS — I10 Essential (primary) hypertension: Secondary | ICD-10-CM | POA: Diagnosis not present

## 2023-12-05 HISTORY — PX: IR TRANSCATH/EMBOLIZ: IMG695

## 2023-12-05 HISTORY — PX: IR CT HEAD LTD: IMG2386

## 2023-12-05 HISTORY — PX: IR NEURO EACH ADD'L AFTER BASIC UNI RIGHT (MS): IMG5374

## 2023-12-05 HISTORY — PX: RADIOLOGY WITH ANESTHESIA: SHX6223

## 2023-12-05 HISTORY — PX: IR ANGIO INTRA EXTRACRAN SEL INTERNAL CAROTID UNI R MOD SED: IMG5362

## 2023-12-05 LAB — POCT ACTIVATED CLOTTING TIME: Activated Clotting Time: 268 s

## 2023-12-05 LAB — CBC WITH DIFFERENTIAL/PLATELET
Abs Immature Granulocytes: 0.02 10*3/uL (ref 0.00–0.07)
Basophils Absolute: 0.1 10*3/uL (ref 0.0–0.1)
Basophils Relative: 2 %
Eosinophils Absolute: 0.2 10*3/uL (ref 0.0–0.5)
Eosinophils Relative: 4 %
HCT: 35 % — ABNORMAL LOW (ref 36.0–46.0)
Hemoglobin: 11.3 g/dL — ABNORMAL LOW (ref 12.0–15.0)
Immature Granulocytes: 0 %
Lymphocytes Relative: 25 %
Lymphs Abs: 1.3 10*3/uL (ref 0.7–4.0)
MCH: 28.7 pg (ref 26.0–34.0)
MCHC: 32.3 g/dL (ref 30.0–36.0)
MCV: 88.8 fL (ref 80.0–100.0)
Monocytes Absolute: 0.5 10*3/uL (ref 0.1–1.0)
Monocytes Relative: 11 %
Neutro Abs: 2.9 10*3/uL (ref 1.7–7.7)
Neutrophils Relative %: 58 %
Platelets: 193 10*3/uL (ref 150–400)
RBC: 3.94 MIL/uL (ref 3.87–5.11)
RDW: 13.2 % (ref 11.5–15.5)
WBC: 5 10*3/uL (ref 4.0–10.5)
nRBC: 0 % (ref 0.0–0.2)

## 2023-12-05 LAB — BASIC METABOLIC PANEL WITH GFR
Anion gap: 13 (ref 5–15)
BUN: 24 mg/dL — ABNORMAL HIGH (ref 8–23)
CO2: 22 mmol/L (ref 22–32)
Calcium: 9.7 mg/dL (ref 8.9–10.3)
Chloride: 105 mmol/L (ref 98–111)
Creatinine, Ser: 1.06 mg/dL — ABNORMAL HIGH (ref 0.44–1.00)
GFR, Estimated: 60 mL/min — ABNORMAL LOW (ref >60)
Glucose, Bld: 85 mg/dL (ref 70–99)
Potassium: 3.5 mmol/L (ref 3.5–5.1)
Sodium: 140 mmol/L (ref 135–145)

## 2023-12-05 LAB — PROTIME-INR
INR: 1 (ref 0.8–1.2)
Prothrombin Time: 13.1 s (ref 11.4–15.2)

## 2023-12-05 LAB — SURGICAL PCR SCREEN
MRSA, PCR: NEGATIVE
Staphylococcus aureus: NEGATIVE

## 2023-12-05 LAB — HEPARIN LEVEL (UNFRACTIONATED): Heparin Unfractionated: <0.1 [IU]/mL — ABNORMAL LOW (ref 0.30–0.70)

## 2023-12-05 LAB — APTT: aPTT: 26 s (ref 24–36)

## 2023-12-05 SURGERY — RADIOLOGY WITH ANESTHESIA
Anesthesia: General

## 2023-12-05 MED ORDER — SUGAMMADEX SODIUM 200 MG/2ML IV SOLN
INTRAVENOUS | Status: DC | PRN
  Administered 2023-12-05: 200 mg via INTRAVENOUS

## 2023-12-05 MED ORDER — SODIUM CHLORIDE 0.9 % IV SOLN: INTRAVENOUS | Status: DC | PRN

## 2023-12-05 MED ORDER — HEPARIN SODIUM (PORCINE) 1000 UNIT/ML IJ SOLN
INTRAMUSCULAR | Status: DC | PRN
  Administered 2023-12-05: 3000 [IU] via INTRAVENOUS

## 2023-12-05 MED ORDER — FENTANYL CITRATE (PF) 250 MCG/5ML IJ SOLN
INTRAMUSCULAR | Status: DC | PRN
  Administered 2023-12-05: 100 ug via INTRAVENOUS

## 2023-12-05 MED ORDER — PROTAMINE SULFATE 10 MG/ML IV SOLN
INTRAVENOUS | Status: DC | PRN
  Administered 2023-12-05: 5 mg via INTRAVENOUS

## 2023-12-05 MED ORDER — HEPARIN (PORCINE) 25000 UT/250ML-% IV SOLN
700.0000 [IU]/h | INTRAVENOUS | Status: DC
  Administered 2023-12-06: 700 [IU]/h via INTRAVENOUS

## 2023-12-05 MED ORDER — CLOPIDOGREL BISULFATE 75 MG PO TABS: 75.0000 mg | ORAL_TABLET | Freq: Every day | ORAL | Status: DC

## 2023-12-05 MED ORDER — ALBUMIN HUMAN 5 % IV SOLN: INTRAVENOUS | Status: DC | PRN

## 2023-12-05 MED ORDER — DEXAMETHASONE SODIUM PHOSPHATE 10 MG/ML IJ SOLN
INTRAMUSCULAR | Status: DC | PRN
  Administered 2023-12-05: 8 mg via INTRAVENOUS

## 2023-12-05 MED ORDER — LACTATED RINGERS IV SOLN: INTRAVENOUS | Status: DC

## 2023-12-05 MED ORDER — SODIUM CHLORIDE (PF) 0.9 % IJ SOLN
INTRAVENOUS | Status: AC | PRN
  Administered 2023-12-05 (×4): 25 ug via INTRA_ARTERIAL

## 2023-12-05 MED ORDER — MIDAZOLAM HCL 2 MG/2ML IJ SOLN
INTRAMUSCULAR | Status: AC
  Filled 2023-12-05: qty 2

## 2023-12-05 MED ORDER — CLOPIDOGREL BISULFATE 75 MG PO TABS
75.0000 mg | ORAL_TABLET | Freq: Every day | ORAL | Status: DC
  Administered 2023-12-06: 75 mg via ORAL
  Filled 2023-12-05: qty 1

## 2023-12-05 MED ORDER — LIDOCAINE HCL 1 % IJ SOLN
INTRAMUSCULAR | Status: AC
  Filled 2023-12-05: qty 20

## 2023-12-05 MED ORDER — PROPOFOL 500 MG/50ML IV EMUL
INTRAVENOUS | Status: DC | PRN
  Administered 2023-12-05: 20 ug/kg/min via INTRAVENOUS

## 2023-12-05 MED ORDER — ALBUMIN HUMAN 5 % IV SOLN: 12.5000 g | Freq: Once | INTRAVENOUS | Status: AC

## 2023-12-05 MED ORDER — IOHEXOL 300 MG/ML  SOLN
150.0000 mL | Freq: Once | INTRAMUSCULAR | Status: AC | PRN
  Administered 2023-12-05: 100 mL via INTRA_ARTERIAL

## 2023-12-05 MED ORDER — ACETAMINOPHEN 650 MG RE SUPP: 650.0000 mg | RECTAL | Status: DC | PRN

## 2023-12-05 MED ORDER — CEFAZOLIN SODIUM-DEXTROSE 2-4 GM/100ML-% IV SOLN
INTRAVENOUS | Status: AC
  Filled 2023-12-05: qty 100

## 2023-12-05 MED ORDER — CLEVIDIPINE BUTYRATE 0.5 MG/ML IV EMUL
INTRAVENOUS | Status: DC | PRN
  Administered 2023-12-05: 2 mg/h via INTRAVENOUS

## 2023-12-05 MED ORDER — ORAL CARE MOUTH RINSE: 15.0000 mL | Freq: Once | OROMUCOSAL | Status: AC

## 2023-12-05 MED ORDER — HEPARIN (PORCINE) 25000 UT/250ML-% IV SOLN: 700.0000 [IU]/h | INTRAVENOUS | Status: DC

## 2023-12-05 MED ORDER — ROCURONIUM BROMIDE 10 MG/ML (PF) SYRINGE
PREFILLED_SYRINGE | INTRAVENOUS | Status: DC | PRN
  Administered 2023-12-05: 20 mg via INTRAVENOUS
  Administered 2023-12-05: 60 mg via INTRAVENOUS
  Administered 2023-12-05: 20 mg via INTRAVENOUS

## 2023-12-05 MED ORDER — ASPIRIN 81 MG PO CHEW: 81.0000 mg | CHEWABLE_TABLET | Freq: Every day | ORAL | Status: DC

## 2023-12-05 MED ORDER — PHENYLEPHRINE HCL-NACL 20-0.9 MG/250ML-% IV SOLN
INTRAVENOUS | Status: DC | PRN
  Administered 2023-12-05: 40 ug/min via INTRAVENOUS

## 2023-12-05 MED ORDER — CHLORHEXIDINE GLUCONATE 0.12 % MT SOLN
15.0000 mL | Freq: Once | OROMUCOSAL | Status: AC
  Administered 2023-12-05: 15 mL via OROMUCOSAL
  Filled 2023-12-05: qty 15

## 2023-12-05 MED ORDER — CLEVIDIPINE BUTYRATE 0.5 MG/ML IV EMUL
0.0000 mg/h | INTRAVENOUS | Status: AC
  Administered 2023-12-05: 2 mg/h via INTRAVENOUS
  Filled 2023-12-05: qty 50

## 2023-12-05 MED ORDER — HEPARIN (PORCINE) 25000 UT/250ML-% IV SOLN
500.0000 [IU]/h | INTRAVENOUS | Status: DC
  Administered 2023-12-05: 500 [IU]/h via INTRAVENOUS

## 2023-12-05 MED ORDER — ACETAMINOPHEN 160 MG/5ML PO SOLN: 650.0000 mg | ORAL | Status: DC | PRN

## 2023-12-05 MED ORDER — SODIUM CHLORIDE 0.9 % IV SOLN
INTRAVENOUS | Status: DC
Start: 2023-12-05 — End: 2023-12-05

## 2023-12-05 MED ORDER — ALBUMIN HUMAN 5 % IV SOLN
INTRAVENOUS | Status: AC
  Administered 2023-12-05: 12.5 g via INTRAVENOUS
  Filled 2023-12-05: qty 250

## 2023-12-05 MED ORDER — ORAL CARE MOUTH RINSE: 15.0000 mL | OROMUCOSAL | Status: DC | PRN

## 2023-12-05 MED ORDER — NITROGLYCERIN 1 MG/10 ML FOR IR/CATH LAB
INTRA_ARTERIAL | Status: AC
  Filled 2023-12-05: qty 10

## 2023-12-05 MED ORDER — CHLORHEXIDINE GLUCONATE CLOTH 2 % EX PADS
6.0000 | MEDICATED_PAD | Freq: Every day | CUTANEOUS | Status: DC
  Administered 2023-12-05 – 2023-12-06 (×2): 6 via TOPICAL

## 2023-12-05 MED ORDER — LIDOCAINE 2% (20 MG/ML) 5 ML SYRINGE
INTRAMUSCULAR | Status: DC | PRN
  Administered 2023-12-05: 60 mg via INTRAVENOUS

## 2023-12-05 MED ORDER — ACETAMINOPHEN 325 MG PO TABS
650.0000 mg | ORAL_TABLET | ORAL | Status: DC | PRN
  Administered 2023-12-05 – 2023-12-06 (×4): 650 mg via ORAL
  Filled 2023-12-05 (×4): qty 2

## 2023-12-05 MED ORDER — SODIUM CHLORIDE 0.9 % IV SOLN: INTRAVENOUS | Status: DC

## 2023-12-05 MED ORDER — HEPARIN (PORCINE) 25000 UT/250ML-% IV SOLN
INTRAVENOUS | Status: AC
  Filled 2023-12-05: qty 250

## 2023-12-05 MED ORDER — EPHEDRINE SULFATE-NACL 50-0.9 MG/10ML-% IV SOSY
PREFILLED_SYRINGE | INTRAVENOUS | Status: DC | PRN
  Administered 2023-12-05 (×3): 5 mg via INTRAVENOUS

## 2023-12-05 MED ORDER — ASPIRIN 81 MG PO CHEW
81.0000 mg | CHEWABLE_TABLET | Freq: Every day | ORAL | Status: DC
  Administered 2023-12-06: 81 mg via ORAL
  Filled 2023-12-05: qty 1

## 2023-12-05 MED ORDER — PHENYLEPHRINE 80 MCG/ML (10ML) SYRINGE FOR IV PUSH (FOR BLOOD PRESSURE SUPPORT)
PREFILLED_SYRINGE | INTRAVENOUS | Status: DC | PRN
Start: 2023-12-05 — End: 2023-12-05
  Administered 2023-12-05 (×2): 160 ug via INTRAVENOUS
  Administered 2023-12-05: 80 ug via INTRAVENOUS
  Administered 2023-12-05: 160 ug via INTRAVENOUS
  Administered 2023-12-05: 80 ug via INTRAVENOUS
  Administered 2023-12-05 (×2): 160 ug via INTRAVENOUS

## 2023-12-05 MED ORDER — PROPOFOL 10 MG/ML IV BOLUS
INTRAVENOUS | Status: DC | PRN
  Administered 2023-12-05: 110 mg via INTRAVENOUS

## 2023-12-05 MED ORDER — FENTANYL CITRATE (PF) 100 MCG/2ML IJ SOLN
INTRAMUSCULAR | Status: AC
  Filled 2023-12-05: qty 2

## 2023-12-05 MED ORDER — CEFAZOLIN SODIUM-DEXTROSE 2-4 GM/100ML-% IV SOLN
2.0000 g | Freq: Once | INTRAVENOUS | Status: AC
  Administered 2023-12-05: 2 g via INTRAVENOUS

## 2023-12-05 MED ORDER — CLEVIDIPINE BUTYRATE 0.5 MG/ML IV EMUL
INTRAVENOUS | Status: AC
  Filled 2023-12-05: qty 50

## 2023-12-05 NOTE — Procedures (Signed)
 INR.  Status post right total arteriogram.  Right CFA approach.  Findings.  Approximately 5 mm x 4 mm mildly lobulated wide neck aneurysm arising at the junction of the right ICA with the right posterior communicating artery.  Status post placement of a 3.5 mm x 16 mm vantage pipeline flow diverter device with stasis within aneurysm.  Post CT brain no ICH.  8 French Angio-Seal closure device used for hemostasis in the right groin puncture site  Distal pulses all bilaterally unchanged from prior to the procedure.  Patient extubated.  Denies any headaches nausea or vomiting.Pupils 2 mm right equals left, and sluggishly reactive.  No facial asymmetry.  Tongue midline.  Patient moves all fours nearly equally proportional to effort.  Jory Ng MD.

## 2023-12-05 NOTE — Transfer of Care (Signed)
 Immediate Anesthesia Transfer of Care Note  Patient: Tammy Aguirre  Procedure(s) Performed: RADIOLOGY WITH ANESTHESIA  Patient Location: PACU  Anesthesia Type:General  Level of Consciousness: awake, alert , oriented, and patient cooperative  Airway & Oxygen Therapy: Patient Spontanous Breathing and Patient connected to face mask oxygen  Post-op Assessment: Report given to RN and Post -op Vital signs reviewed and stable  Post vital signs: Reviewed and stable  Last Vitals:  Vitals Value Taken Time  BP 107/58 12/05/23 1230  Temp    Pulse 89 12/05/23 1237  Resp 20 12/05/23 1240  SpO2 100 % 12/05/23 1237  Vitals shown include unfiled device data.  Last Pain:  Vitals:   12/05/23 0643  TempSrc:   PainSc: 3       Patients Stated Pain Goal: 0 (12/05/23 2956)  Complications: No notable events documented.

## 2023-12-05 NOTE — Sedation Documentation (Signed)
ACT [id number]

## 2023-12-05 NOTE — Sedation Documentation (Signed)
 Patient moved to table by staff , secured, hooked to monitors, and now under the care of anesthesia. Please see charting in vitals per CRNA.

## 2023-12-05 NOTE — H&P (Signed)
 Chief Complaint: Patient was seen in consultation today for right posterior communicating artery aneurysm  Referring Physician(s): Dr. Merl Star  Supervising Physician: Luellen Sages  Patient Status: [facility] - Out-pt  History of Present Illness: Tammy Aguirre is a 63 y.o. female known to Interventional Neuroradiology from recent uneventful treatment of her complex unruptured R MCA bifurcation aneurysm using a pipeline flow diverter 10/17/23.  She recovered well from this procedure with only minimal groin procedure site tenderness without evidence of complication.  She continues to have intermittent visual field disurbances.  She Aguirre today for planned, elective right posterior communicating artery aneurysm.   Tammy Aguirre today in her usual state of health.  She reports that she has a mild generalized headache today and continues to experience a "gray patch" in the vision oin her R eye. She did visit a ophthalmologist as directed. No new findings from that exam, but she was instructed to return to ophthalmology in 1 mo for re-exam.  She notes having a migraine over the weekend which she has not experienced in months. Denies fever, chills, SOB, CP, abd pain (does endorse feeling distended), N/V, weakness, numbness, difficulty speaking.   She did have serial P2Y12 values obtained last week both of which showed hyperreponsiveness.  Medication adjustements were made over the weekend which she has adhered to. She held her plavix  5/3, restarted 5/4, and continued 5/5. She has continued to take ASA She is NPO since MN.    Past Medical History:  Diagnosis Date   Anemia    History of   Arthritis    Right Shoulder   History of TIA (transient ischemic attack)    Hypertension    Migraines    History of   PONV (postoperative nausea and vomiting)     Past Surgical History:  Procedure Laterality Date   ABDOMINAL HYSTERECTOMY     CESAREAN SECTION     x 1   COLONOSCOPY      IR 3D INDEPENDENT WKST  10/17/2023   IR ANGIO VERTEBRAL SEL VERTEBRAL BILAT MOD SED  10/17/2023   IR ANGIOGRAM FOLLOW UP STUDY  10/21/2023   IR CT HEAD LTD  10/17/2023   IR NEURO EACH ADD'L AFTER BASIC UNI RIGHT (MS)  10/21/2023   IR RADIOLOGIST EVAL & MGMT  09/30/2023   MRI   IR RADIOLOGIST EVAL & MGMT  11/04/2023   IR TRANSCATH/EMBOLIZ  10/17/2023   NECK SURGERY     plate in neck   RADIOLOGY WITH ANESTHESIA N/A 10/17/2023   Procedure: RADIOLOGY WITH ANESTHESIA;  Surgeon: Luellen Sages, MD;  Location: [facility] OR;  Service: Radiology;  Laterality: N/A;  Angiogram with intent to treat   SHOULDER ARTHROSCOPY WITH OPEN ROTATOR CUFF REPAIR AND DISTAL CLAVICLE ACROMINECTOMY Right 11/24/2021   Procedure: RIGHT SHOULDER ARTHROSCOPIC SUBACROMIAL DECOMPRESSION WITH OPEN ROTATOR CUFF REPAIR AND DISTAL CLAVICLE ACROMINECTOMY;  Surgeon: Shirlee Dotter, MD;  Location: [facility] ORS;  Service: Orthopedics;  Laterality: Right;   UPPER GI ENDOSCOPY      Allergies: Tetracyclines & related  Medications: Prior to Admission medications   Medication Sig Start Date End Date Taking? Authorizing Provider  aspirin  EC 81 MG tablet Take 81 mg by mouth daily. Swallow whole.    [provider]  Bacillus Coagulans-Inulin (ALIGN PREBIOTIC-PROBIOTIC PO) Take by mouth.    [provider]  clopidogrel  (PLAVIX ) 75 MG tablet Take 1 tablet (75 mg total) by mouth daily. 10/04/23 01/02/24  Covington, Jamie R, NP  fexofenadine (ALLEGRA) 180 MG  tablet Take 180 mg by mouth daily as needed for allergies.    [provider]  finasteride (PROSCAR) 5 MG tablet Take 5 mg by mouth daily. 11/07/17   [provider]  irbesartan-hydrochlorothiazide (AVALIDE) 150-12.5 MG tablet Take 1 tablet by mouth every evening. 10/19/17   [provider]  OVER THE COUNTER MEDICATION Take 4 tablets by mouth daily. Nutrafol supplement    [provider]  triamcinolone ointment (KENALOG) 0.1 % Apply 1 Application  topically 2 (two) times daily as needed (rash). 07/05/23   [provider]  VITAMIN D PO Take 3 drops by mouth daily. liquid    [provider]     History reviewed. No pertinent family history.  Social History   Socioeconomic History   Marital status: Legally Separated    Spouse name: Not on file   Number of children: Not on file   Years of education: Not on file   Highest education level: Not on file  Occupational History   Not on file  Tobacco Use   Smoking status: Never   Smokeless tobacco: Never  Vaping Use   Vaping status: Never Used  Substance and Sexual Activity   Alcohol use: No   Drug use: No   Sexual activity: Yes    Birth control/protection: Surgical    Comment: Hysterectomy  Other Topics Concern   Not on file  Social History Narrative   Not on file   Social Drivers of Health   Financial Resource Strain: Not on file  Food Insecurity: No Food Insecurity (10/18/2023)   Hunger Vital Sign    Worried About Running Out of Food in the Last Year: Never true    Ran Out of Food in the Last Year: Never true  Transportation Needs: No Transportation Needs (10/18/2023)   PRAPARE - Administrator, Civil Service (Medical): No    Lack of Transportation (Non-Medical): No  Physical Activity: Not on file  Stress: Not on file  Social Connections: Not on file     Review of Systems: A 12 point ROS discussed and pertinent positives are indicated in the HPI above.  All other systems are negative.    Vital Signs: BP (!) 147/69   Pulse 71   Temp 97.6 F (36.4 C) (Oral)   Resp 17   Ht 5\' 1"  (1.549 m)   Wt 130 lb (59 kg)   SpO2 100%   BMI 24.56 kg/m   Physical Exam Constitutional:      Appearance: Normal appearance.  HENT:     Mouth/Throat:     Mouth: Mucous membranes are moist.     Pharynx: Oropharynx is clear.  Cardiovascular:     Rate and Rhythm: Normal rate and regular rhythm.     Pulses: Normal pulses.     Heart sounds: Normal  heart sounds. No murmur heard. Pulmonary:     Effort: Pulmonary effort is normal.     Breath sounds: Normal breath sounds.  Abdominal:     General: There is no distension.     Palpations: Abdomen is soft. There is no mass.     Tenderness: There is no abdominal tenderness.  Musculoskeletal:     Right lower leg: No edema.     Left lower leg: No edema.  Skin:    General: Skin is warm and dry.  Neurological:     Mental Status: She is alert and oriented to person, place, and time.     Sensory: No sensory  deficit.     Motor: No weakness.  Psychiatric:        Mood and Affect: Mood normal.        Behavior: Behavior normal.        Thought Content: Thought content normal.        Judgment: Judgment normal.      MD Evaluation Airway: WNL Heart: WNL Abdomen: WNL Chest/ Lungs: WNL ASA  Classification: 3 Mallampati/Airway Score: Three   Imaging: No results found.  Labs:  CBC: Recent Labs    10/17/23 0620 10/18/23 0516 12/05/23 0550  WBC 5.9 5.3 5.0  HGB 10.9* 9.4* 11.3*  HCT 33.4* 28.9* 35.0*  PLT 154 127* 193    COAGS: Recent Labs    10/17/23 0620 12/05/23 0550  INR 1.0 1.0  APTT  --  26    BMP: Recent Labs    10/17/23 0620 10/18/23 0516  NA 140 141  K 3.9 3.2*  CL 108 110  CO2 25 22  GLUCOSE 96 97  BUN 25* 12  CALCIUM 9.1 8.0*  CREATININE 1.00 0.78  GFRNONAA >60 >60    LIVER FUNCTION TESTS: No results for input(s): "BILITOT", "AST", "ALT", "ALKPHOS", "PROT", "ALBUMIN" in the last 8760 hours.  TUMOR MARKERS: No results for input(s): "AFPTM", "CEA", "CA199", "CHROMGRNA" in the last 8760 hours.  Assessment and Plan: Right posterior communicating artery aneurysm Patient with past medical history of L MCA bifurcation aneurysm treatment Aguirre for additional intervention of her multiple intra-cranial aneurysms.  Goal of the procedure today will be irradication of her right posterior communicating artery aneurysm.   Patient has discussed and  determine treatment stategy with Dr. Alvira Josephs.  Patient Aguirre today in their usual state of health.  She has been NPO and is not currently on blood thinners.  She held 5/3 plavix , restarted 5/4 and continued 5/5. She is aware she will be admitted overnight.    Risks and benefits of cerebral angiogram with embolization vs. Stent placement with intervention were discussed with the patient including, but not limited to bleeding, infection, vascular injury, contrast induced renal failure, stroke or even death.  This interventional procedure involves the use of X-rays and because of the nature of the planned procedure, it is possible that we will have prolonged use of X-ray fluoroscopy.  Potential radiation risks to you include (but are not limited to) the following: - A slightly elevated risk for cancer  several years later in life. This risk is typically less than 0.5% percent. This risk is low in comparison to the normal incidence of human cancer, which is 33% for women and 50% for men according to the [employer] Society. - Radiation induced injury can include skin redness, resembling a rash, tissue breakdown / ulcers and hair loss (which can be temporary or permanent).   The likelihood of either of these occurring depends on the difficulty of the procedure and whether you are sensitive to radiation due to previous procedures, disease, or genetic conditions.   IF your procedure requires a prolonged use of radiation, you will be notified and given written instructions for further action.  It is your responsibility to monitor the irradiated area for the 2 weeks following the procedure and to notify your physician if you are concerned that you have suffered a radiation induced injury.    All of the patient's questions were answered, patient is agreeable to proceed.  Consent signed and in chart.   Thank you for this interesting consult.  I greatly enjoyed  meeting Illona Malone and  look forward to participating in their care.  A copy of this report was sent to the requesting provider on this date.  Electronically Signed: Terressa Fess, NP 12/05/2023, 7:58 AM   I spent a total of  30 Minutes   in face to face in clinical consultation, greater than 50% of which was counseling/coordinating care for right posterior communicating artery aneurysm.

## 2023-12-05 NOTE — Anesthesia Procedure Notes (Signed)
 Procedure Name: Intubation Date/Time: 12/05/2023 9:40 AM  Performed by: Luwanna Sam, CRNAPre-anesthesia Checklist: Patient identified, Emergency Drugs available, Suction available, Patient being monitored and Timeout performed Patient Re-evaluated:Patient Re-evaluated prior to induction Oxygen Delivery Method: Circle system utilized Preoxygenation: Pre-oxygenation with 100% oxygen Induction Type: IV induction Ventilation: Mask ventilation without difficulty Laryngoscope Size: Mac and 3 Grade View: Grade I Tube type: Oral Tube size: 7.0 mm Number of attempts: 1 Placement Confirmation: ETT inserted through vocal cords under direct vision, positive ETCO2 and breath sounds checked- equal and bilateral Secured at: 23 cm Tube secured with: Tape Dental Injury: Teeth and Oropharynx as per pre-operative assessment

## 2023-12-05 NOTE — Anesthesia Procedure Notes (Signed)
 Arterial Line Insertion Start/End5/12/2023 9:05 AM, 12/05/2023 9:07 AM Performed by: Alphia Jasmine, CRNA, CRNA  Patient location: OOR procedure area. Preanesthetic checklist: patient identified, IV checked, site marked, risks and benefits discussed, surgical consent, monitors and equipment checked, pre-op evaluation, timeout performed and anesthesia consent Patient sedated Left, radial was placed Catheter size: 20 G Hand hygiene performed  and maximum sterile barriers used  Allen's test indicative of satisfactory collateral circulation Attempts: 1 Procedure performed without using ultrasound guided technique. Following insertion, dressing applied and Biopatch. Post procedure assessment: normal  Patient tolerated the procedure well with no immediate complications.

## 2023-12-05 NOTE — Sedation Documentation (Signed)
 Bedside report given to RN. Femoral site assessed - Level 0, no hematoma, dressing is clean, dry, and intact. Pulses also assessed bilaterally.

## 2023-12-05 NOTE — Progress Notes (Addendum)
 PHARMACY - ANTICOAGULATION CONSULT NOTE  Pharmacy Consult for heparin  Indication:  post-neuro IR  Allergies  Allergen Reactions   Tetracyclines & Related Rash    Patient Measurements: Height: 5\' 1"  (154.9 cm) Weight: 61.9 kg (136 lb 7.4 oz) IBW/kg (Calculated) : 47.8 HEPARIN  DW (KG): 60.4  Vital Signs: Temp: 98.8 F (37.1 C) (05/05 1555) Temp Source: Oral (05/05 1555) BP: 99/58 (05/05 1445) Pulse Rate: 86 (05/05 1600)  Labs: Recent Labs    12/05/23 0550  HGB 11.3*  HCT 35.0*  PLT 193  APTT 26  LABPROT 13.1  INR 1.0  CREATININE 1.06*    Estimated Creatinine Clearance: 47 mL/min (A) (by C-G formula based on SCr of 1.06 mg/dL (H)).   Medical History: Past Medical History:  Diagnosis Date   Anemia    History of   Arthritis    Right Shoulder   History of TIA (transient ischemic attack)    Hypertension    Migraines    History of   PONV (postoperative nausea and vomiting)       Assessment: 26 yoF s/p neuro IR stenting. Pharmacy to dose IV heparin , started at 500 unitsh at 1330.  Goal of Therapy:  Heparin  level 0.1-0.25 units/ml Monitor platelets by anticoagulation protocol: Yes   Plan:  Heparin  500 units/h Will check heparin  level ina  few hours (6h after start)  ADDENDUM 2220: heparin  level <0.1. Increase to 700 units/h  Levin Reamer, PharmD, [place], [facility] Clinical Pharmacist [phone] Please check AMION for all [facility] Pharmacy numbers 12/05/2023

## 2023-12-05 NOTE — Sedation Documentation (Signed)
 Patient transported to PACU via stretcher with RN and CRNA.

## 2023-12-05 NOTE — Progress Notes (Signed)
 Patient seen on unit (0J81)  post uncomplicated cerebral angiogram  today with flow diverter for treatment of right posterior communicating aneurysm.    Patient seen at bedside with Dr. Alvira Josephs. No complaints except ready to have something to drink - specifically denies HA, n/v, chest pain, dyspnea, vision changes (pre-procedure vision changes for which she will return to ophthalmology in 1 mo remain), speech difficulties. Patient reporting mild headache without tinnitus. Denies n/v, chest pain, dyspnea.    Alert, awake, and oriented x3. Speech and comprehension intact. PERRL bilaterally. R subconjunctival hemorrhage noted without pain or vision change. EOMs intact bilaterally without nystagmus or subjective diplopia. Visual fields intact bilaterally. No facial asymmetry. Tongue midline. Motor power symmetric proportional to effort. No pronator drift. Fine motor and coordination intact and symmetric. 5/5 Strength BLE and BUE.  Right CFA puncture site clean, dry, dressed appropriately, no active bleeding, soft, appropriately tender to palpation.   Plan: - Bedrest, HOB flat until at least 1730 tonight, foley to remain overnight - May have sips of water and then advance diet to full liquids if right CFA puncture site stable at 1730 assessment - Heparin  gtt (managed by pharmacy) to be continued until tomorrow - Plavix  75 mg QD starting tomorrow, ASA 81 mg QD starting tomorrow 1000, restart irbesartan tomorrow -MAP goal 70-85 - NIR will assess patient in AM for potential discharge   Please call Dr. Alvira Josephs with overnight concerns.

## 2023-12-05 NOTE — Anesthesia Preprocedure Evaluation (Addendum)
 Anesthesia Evaluation  Patient identified by MRN, date of birth, ID band Patient awake    Reviewed: Allergy & Precautions, NPO status , Patient's Chart, lab work & pertinent test results  History of Anesthesia Complications (+) PONV and history of anesthetic complications  Airway Mallampati: II  TM Distance: >3 FB Neck ROM: Full    Dental  (+) Dental Advisory Given   Pulmonary    breath sounds clear to auscultation       Cardiovascular hypertension, Pt. on medications (-) angina (-) Past MI and (-) CHF (-) dysrhythmias  Rhythm:Regular     Neuro/Psych  Headaches, neg Seizures  negative psych ROS   GI/Hepatic negative GI ROS, Neg liver ROS,,,  Endo/Other  negative endocrine ROS    Renal/GU negative Renal ROS     Musculoskeletal  (+) Arthritis ,    Abdominal   Peds  Hematology  (+) Blood dyscrasia, anemia Lab Results      Component                Value               Date                      WBC                      5.0                 12/05/2023                HGB                      11.3 (L)            12/05/2023                HCT                      35.0 (L)            12/05/2023                MCV                      88.8                12/05/2023                PLT                      193                 12/05/2023                 Anesthesia Other Findings   Reproductive/Obstetrics                             Anesthesia Physical Anesthesia Plan  ASA: 3  Anesthesia Plan: General   Post-op Pain Management: Minimal or no pain anticipated   Induction: Intravenous  PONV Risk Score and Plan: 4 or greater and Ondansetron  and Dexamethasone   Airway Management Planned: Oral ETT  Additional Equipment: Arterial line  Intra-op Plan:   Post-operative Plan: Extubation in OR  Informed Consent: I have reviewed the patients History and Physical, chart, labs and discussed the  procedure including the risks, benefits and  alternatives for the proposed anesthesia with the patient or authorized representative who has indicated his/her understanding and acceptance.     Dental advisory given  Plan Discussed with: CRNA  Anesthesia Plan Comments:         Anesthesia Quick Evaluation

## 2023-12-06 ENCOUNTER — Other Ambulatory Visit (INDEPENDENT_AMBULATORY_CARE_PROVIDER_SITE_OTHER): Payer: Self-pay | Admitting: Otolaryngology

## 2023-12-06 ENCOUNTER — Encounter (HOSPITAL_COMMUNITY): Payer: Self-pay | Admitting: Interventional Radiology

## 2023-12-06 ENCOUNTER — Telehealth: Payer: Self-pay

## 2023-12-06 ENCOUNTER — Telehealth (INDEPENDENT_AMBULATORY_CARE_PROVIDER_SITE_OTHER): Payer: Self-pay | Admitting: Otolaryngology

## 2023-12-06 DIAGNOSIS — H9221 Otorrhagia, right ear: Secondary | ICD-10-CM | POA: Diagnosis not present

## 2023-12-06 DIAGNOSIS — Z7901 Long term (current) use of anticoagulants: Secondary | ICD-10-CM | POA: Diagnosis not present

## 2023-12-06 LAB — CBC WITH DIFFERENTIAL/PLATELET
Abs Immature Granulocytes: 0.02 10*3/uL (ref 0.00–0.07)
Basophils Absolute: 0 10*3/uL (ref 0.0–0.1)
Basophils Relative: 1 %
Eosinophils Absolute: 0 10*3/uL (ref 0.0–0.5)
Eosinophils Relative: 0 %
HCT: 25.9 % — ABNORMAL LOW (ref 36.0–46.0)
Hemoglobin: 8.5 g/dL — ABNORMAL LOW (ref 12.0–15.0)
Immature Granulocytes: 0 %
Lymphocytes Relative: 26 %
Lymphs Abs: 1.4 10*3/uL (ref 0.7–4.0)
MCH: 28.8 pg (ref 26.0–34.0)
MCHC: 32.8 g/dL (ref 30.0–36.0)
MCV: 87.8 fL (ref 80.0–100.0)
Monocytes Absolute: 0.4 10*3/uL (ref 0.1–1.0)
Monocytes Relative: 7 %
Neutro Abs: 3.7 10*3/uL (ref 1.7–7.7)
Neutrophils Relative %: 66 %
Platelets: 140 10*3/uL — ABNORMAL LOW (ref 150–400)
RBC: 2.95 MIL/uL — ABNORMAL LOW (ref 3.87–5.11)
RDW: 13.5 % (ref 11.5–15.5)
WBC: 5.6 10*3/uL (ref 4.0–10.5)
nRBC: 0 % (ref 0.0–0.2)

## 2023-12-06 LAB — BASIC METABOLIC PANEL WITH GFR
Anion gap: 7 (ref 5–15)
BUN: 13 mg/dL (ref 8–23)
CO2: 18 mmol/L — ABNORMAL LOW (ref 22–32)
Calcium: 7.9 mg/dL — ABNORMAL LOW (ref 8.9–10.3)
Chloride: 116 mmol/L — ABNORMAL HIGH (ref 98–111)
Creatinine, Ser: 0.85 mg/dL (ref 0.44–1.00)
GFR, Estimated: >60 mL/min (ref >60)
Glucose, Bld: 84 mg/dL (ref 70–99)
Potassium: 3.1 mmol/L — ABNORMAL LOW (ref 3.5–5.1)
Sodium: 141 mmol/L (ref 135–145)

## 2023-12-06 LAB — APTT: aPTT: 33 s (ref 24–36)

## 2023-12-06 LAB — PROTIME-INR
INR: 1.1 (ref 0.8–1.2)
Prothrombin Time: 14.5 s (ref 11.4–15.2)

## 2023-12-06 LAB — PLATELET INHIBITION P2Y12: Platelet Function  P2Y12: 46 [PRU] — ABNORMAL LOW (ref 182–335)

## 2023-12-06 MED ORDER — CIPROFLOXACIN-DEXAMETHASONE 0.3-0.1 % OT SUSP: 4.0000 [drp] | Freq: Two times a day (BID) | OTIC | 0 refills | Status: DC

## 2023-12-06 NOTE — Discharge Summary (Addendum)
 Physician Discharge Summary  Patient ID: Tammy Aguirre MRN: [id number] DOB/AGE: [birth year] 62 y.o.  Admit date: 12/05/2023 Discharge date: 12/06/2023  Admission Diagnoses:R posterior communicating artery aneurysm  Discharge Diagnoses:  Principal Problem:   Brain aneurysm   Discharged Condition: stable  Hospital Course: Tammy Aguirre is a 63 y.o. female known to Interventional Neuroradiology from recent uneventful treatment of her complex unruptured R MCA bifurcation aneurysm using a pipeline flow diverter 10/17/23.  She recovered well from this procedure. 12/05/23 patient underwent cerebral angiogram with placement of flow diverter for 2nd R posterior communicating artery aneurysm.    Consults: None  Significant Diagnostic Studies: 12/05/23 angiography: Approximately 5 mm x 4 mm mildly lobulated wide neck aneurysm arising at the junction of the right ICA with the right posterior communicating artery. Status post placement of a 3.5 mm x 16 mm vantage pipeline flow diverter device with stasis within aneurysm. Post CT brain no ICH.  Treatments: Cerebral angiogram with intervention, admission for observation post general anesthesia. ENT consulted for sanguinous drainage from R ear. Patient tolerating PO intake and ambulatory prior to DC.   Discharge Exam: Blood pressure 118/68, pulse 70, temperature 98.1 F (36.7 C), temperature source Oral, resp. rate (!) 22, height 5\' 1"  (1.549 m), weight 136 lb 7.4 oz (61.9 kg), SpO2 100%.  Physical Exam: Alert, awake, and oriented x3. Speech and comprehension intact. PERRL bilaterally. Subconjunctival hemorrhage present but improved from yesterday.   EOMs intact bilaterally without nystagmus or subjective diplopia. Visual fields intact bilaterally. No facial asymmetry. Tongue midline. Motor power symmetric proportional to effort. No pronator drift. Fine motor and coordination intact and symmetric. 5/5 Strength bilaterally Common femoral artery  puncture site looks good, no bleeding, no hematoma, no pseudoaneurysm, tenderness in proportion   Disposition: Home   Allergies as of 12/06/2023       Reactions   Tetracyclines & Related Rash        Medication List     TAKE these medications    ALIGN PREBIOTIC-PROBIOTIC PO Take by mouth.   aspirin  EC 81 MG tablet Take 81 mg by mouth daily. Swallow whole.   clopidogrel  75 MG tablet Commonly known as: Plavix  Take 1 tablet (75 mg total) by mouth daily.   fexofenadine 180 MG tablet Commonly known as: ALLEGRA Take 180 mg by mouth daily as needed for allergies.   finasteride 5 MG tablet Commonly known as: PROSCAR Take 5 mg by mouth daily.   irbesartan-hydrochlorothiazide 150-12.5 MG tablet Commonly known as: AVALIDE Take 1 tablet by mouth every evening.   OVER THE COUNTER MEDICATION Take 4 tablets by mouth daily. Nutrafol supplement   triamcinolone ointment 0.1 % Commonly known as: KENALOG Apply 1 Application topically 2 (two) times daily as needed (rash).   VITAMIN D PO Take 3 drops by mouth daily. liquid        Follow-up Information     Artice Last, MD Follow up.   Specialty: Otolaryngology Why: 2 weeks for reassess Contact information: [address] [place] [address] [phone]         Luellen Sages, MD Follow up.   Specialties: Interventional Radiology, Radiology Why: 2 weeks post-procedure IR scheduler will call you to set this up Contact information: [address] [place] [address] [phone]                 Signed: Terressa Fess 12/06/2023, 2:15 PM

## 2023-12-06 NOTE — Progress Notes (Signed)
 RN noticed red sanguinous fluid from right ear. Neuro status unchanged. Paged Neuro IR on call.

## 2023-12-06 NOTE — Consult Note (Signed)
 ENT CONSULT:  Reason for Consult: right ear bleeding s/p IR procedure for ICA aneurysm   HPI:  49 yoF admitted following right ICA aneurysm IR procedure, with right ear bleeding this am. Received heparin  for the procedure, stopped this am, on Plavix . Denies ear trauma. Denies episodes of bleeding like this before. Hearing is muffled, but she denies pain. No hx of ear infections or ear surgeries. Bleeding resolved since it started. ENT was consulted for evaluation.     Past Medical History:  Diagnosis Date   Anemia    History of   Arthritis    Right Shoulder   History of TIA (transient ischemic attack)    Hypertension    Migraines    History of   PONV (postoperative nausea and vomiting)     Past Surgical History:  Procedure Laterality Date   ABDOMINAL HYSTERECTOMY     CESAREAN SECTION     x 1   COLONOSCOPY     IR 3D INDEPENDENT WKST  10/17/2023   IR ANGIO VERTEBRAL SEL VERTEBRAL BILAT MOD SED  10/17/2023   IR ANGIOGRAM FOLLOW UP STUDY  10/21/2023   IR CT HEAD LTD  10/17/2023   IR NEURO EACH ADD'L AFTER BASIC UNI RIGHT (MS)  10/21/2023   IR RADIOLOGIST EVAL & MGMT  09/30/2023   MRI   IR RADIOLOGIST EVAL & MGMT  11/04/2023   IR TRANSCATH/EMBOLIZ  10/17/2023   NECK SURGERY     plate in neck   RADIOLOGY WITH ANESTHESIA N/A 10/17/2023   Procedure: RADIOLOGY WITH ANESTHESIA;  Surgeon: Luellen Sages, MD;  Location: [facility];  Service: Radiology;  Laterality: N/A;  Angiogram with intent to treat   RADIOLOGY WITH ANESTHESIA N/A 12/05/2023   Procedure: RADIOLOGY WITH ANESTHESIA;  Surgeon: Luellen Sages, MD;  Location: [facility] OR;  Service: Radiology;  Laterality: N/A;  Endovascular treatment of the second aneurysm in the right posterior communicating artery region   SHOULDER ARTHROSCOPY WITH OPEN ROTATOR CUFF REPAIR AND DISTAL CLAVICLE ACROMINECTOMY Right 11/24/2021   Procedure: RIGHT SHOULDER ARTHROSCOPIC SUBACROMIAL DECOMPRESSION WITH OPEN ROTATOR CUFF REPAIR AND DISTAL CLAVICLE  ACROMINECTOMY;  Surgeon: Shirlee Dotter, MD;  Location: [facility];  Service: Orthopedics;  Laterality: Right;   UPPER GI ENDOSCOPY      History reviewed. No pertinent family history.  Social History:  reports that she has never smoked. She has never used smokeless tobacco. She reports that she does not drink alcohol and does not use drugs.  Allergies:  Allergies  Allergen Reactions   Tetracyclines & Related Rash    Medications: I have reviewed the patient's current medications.  The PMH, PSH, Medications, Allergies, and SH were reviewed and updated.  ROS: Constitutional: Negative for fever, weight loss and weight gain. Cardiovascular: Negative for chest pain and dyspnea on exertion. Respiratory: Is not experiencing shortness of breath at rest. Gastrointestinal: Negative for nausea and vomiting. Neurological: Negative for headaches. Psychiatric: The patient is not nervous/anxious  Blood pressure 126/66, pulse 85, temperature 98.2 F (36.8 C), temperature source Oral, resp. rate 19, height 5\' 1"  (1.549 m), weight 61.9 kg, SpO2 99%. Body mass index is 25.78 kg/m.  PHYSICAL EXAM:  Exam: General: Well-developed, well-nourished Respiratory Respiratory effort: Equal inspiration and expiration without stridor Cardiovascular Peripheral Vascular: Warm extremities with equal color/perfusion Eyes: No nystagmus with equal extraocular motion bilaterally Neuro/Psych/Balance: Patient oriented to person, place, and time; Appropriate mood and affect; Cranial nerves I-XII are intact Head and Face Inspection: Normocephalic and atraumatic without mass or lesion Facial Strength:  Facial motility symmetric and full bilaterally ENT Pinna: External ear intact and fully developed External canal: Canal is patent with evidence of dry blood in the right EAC, no active bleeding, some cerumen in the left EAC Tympanic Membrane: not seen due to cerumen on the left and blood clot on the right  External  Nose: No scar or anatomic deformity Lips, Teeth, and gums: Mucosa and teeth intact and viable Neck and Trachea: Midline trachea without mass or lesion Thyroid : No mass or nodularity Lymphatics: No lymphadenopathy  Studies Reviewed: CT head 10/17/23 1. Interval pipeline placement at the distal right M1 segment/right MCA bifurcation for treatment of previously identified right MCA bifurcation aneurysm. No acute intracranial hemorrhage or other complication. 2. No other acute intracranial abnormality.    Assessment/Plan: Episode of right ear bleeding, s/p IR procedure for R ICA aneurysm, bleeding resolved  Anticoagulated  No evidence of active bleeding on exam, clot in the right ear canal, bleeding could be from anticoagulation and skin tear in EAC, needs ear drops to prevent infection and to help evacuate clot. Will remove residual clot in the office in 1-2 weeks and examine ear drum. Will consider outpatient hearing evaluation if needed.   - no acute ENT interventions - ciprodex ear drops or equivalent available on the formulary 5 drops R ear BID x 2 weeks  - outpatient f/u with me in 2 weeks   Thank you for allowing me to participate in the care of this patient. Please do not hesitate to contact me with any questions or concerns.   Artice Last, MD Otolaryngology [facility] Phone: [phone] Fax: [phone]    12/06/2023, 12:10 PM

## 2023-12-06 NOTE — Progress Notes (Signed)
 Patient noted to have bleeding in right ear canal. Dr. Alvira Josephs called and informed of bleeding as well as informed him of heparin  drip off at 0657. Neuro Status unchanged, complaining of dull headache which has been constant since procedure.

## 2023-12-06 NOTE — Discharge Instructions (Addendum)
-  Keep your appt with your eye doctor for f/u -See ENT in 2 weeks  -F/u with Dr. Alvira Josephs in 2 weeks -STAY WELL HYDRATED -No Bending or stooping for 2 weeks. No pushing or pulling greater than 10 pounds for 2 weeks. No driving for 2 weeks.     Femoral Site Care This sheet gives you information about how to care for yourself after your procedure. Your health care provider may also give you more specific instructions. If you have problems or questions, contact your health care provider. What can I expect after the procedure? After the procedure, it is common to have: Bruising that usually fades within 1-2 weeks. Tenderness at the site. Follow these instructions at home: Wound care Follow instructions from your health care provider about how to take care of your insertion site. Make sure you: Wash your hands with soap and water before you change your bandage (dressing). If soap and water are not available, use hand sanitizer. Change your dressing as directed- pressure dressing removed 24 hours post-procedure (and switch for bandaid), bandaid removed 72 hours post-procedure Do not take baths, swim, or use a hot tub for 7 days post-procedure. You may shower 48 hours after the procedure or as told by your health care provider. Gently wash the site with plain soap and water. Pat the area dry with a clean towel. Do not rub the site. This may cause bleeding. Check your site every day for signs of infection. Check for: Redness, swelling, or pain. Fluid or blood. Warmth. Pus or a bad smell. Activity Do not stoop, bend, or lift anything that is heavier than 10 lb (4.5 kg) for 2 weeks post-procedure. Do not drive self for 2 weeks post-procedure. Contact a health care provider if you have: A fever or chills. You have redness, swelling, or pain around your insertion site. Get help right away if: The catheter insertion area swells very fast. You pass out. You suddenly start to sweat or your skin  gets clammy. The catheter insertion area is bleeding, and the bleeding does not stop when you hold steady pressure on the area. The area near or just beyond the catheter insertion site becomes pale, cool, tingly, or numb. These symptoms may represent a serious problem that is an emergency. Do not wait to see if the symptoms will go away. Get medical help right away. Call your local emergency services (911 in the [place] Do not drive yourself to the hospital.  This information is not intended to replace advice given to you by your health care provider. Make sure you discuss any questions you have with your health care provider. Document Revised: 08/01/2017 Document Reviewed: 08/01/2017 Elsevier Patient Education  2020 [employer].

## 2023-12-06 NOTE — Telephone Encounter (Signed)
 Unable to reach patient by phone.  Spoke to patient's daughter via phone Debbi Tukes) and shared Dr. Jory Ng instructions for Plavix :  Take half of your normal dose of Plavix  tomorrow and Thursday. Return to normal dose (75mg  daily) on Friday.  She states that she is heading to the patient's house now with the ear drops ordered by ENT and will relay this information.

## 2023-12-06 NOTE — Telephone Encounter (Signed)
 Pt's dtr left message on [facility] voicemail stating that Dr. Larkin Plumb saw her mother in ICU this morning. An order for ear drops was to be submitted but the patient has not received them and she is c/o pain. Patient's dtr requested a call back however, I did not see an updated DPR with daughter's name listed. There is one from 2023 with patient's son listed.

## 2023-12-06 NOTE — Progress Notes (Signed)
 Chief Complaint: Patient was seen today for aneurysm of the right posterior communicating artery   Referring Physician(s): * No referring provider recorded for this case *  Supervising Physician: Luellen Sages  Patient Status: [facility] - In-pt  Subjective: 5/6: Patient is one day post cerebral angiogram with placement of pipeline flow diverter. Patient reports being able to eat chicken broth last night, but has not advanced her diet yet. She also notes bleeding in the right ear that started early this morning. Patient denies: headache, nausea, vomiting, vision changes, and vomiting.   Objective: Physical Exam: BP 114/62   Pulse 64   Temp 98.2 F (36.8 C) (Oral)   Resp 14   Ht 5\' 1"  (1.549 m)   Wt 136 lb 7.4 oz (61.9 kg)   SpO2 99%   BMI 25.78 kg/m  ENT: Right ear with dried blood present. Mild TTP over the right preauricular area. Left ear unremarkable.  Alert, awake, and oriented x3. Speech and comprehension intact. PERRL bilaterally. EOMs intact bilaterally without nystagmus or subjective diplopia. Visual fields intact bilaterally. No facial asymmetry. Tongue midline. Motor power symmetric proportional to effort. No pronator drift. Fine motor and coordination intact and symmetric.  5/5 Strength bilaterally Common femoral artery puncture site right groin looks good, no bleeding, no hematoma, no pseudoaneurysm     Current Facility-Administered Medications:    0.9 %  sodium chloride  infusion, , Intravenous, Continuous, Deveshwar, Sanjeev, MD, Last Rate: 75 mL/hr at 12/06/23 0700, Infusion Verify at 12/06/23 0700   acetaminophen  (TYLENOL ) tablet 650 mg, 650 mg, Oral, Q4H PRN, 650 mg at 12/05/23 2146 **OR** acetaminophen  (TYLENOL ) 160 MG/5ML solution 650 mg, 650 mg, Per Tube, Q4H PRN **OR** acetaminophen  (TYLENOL ) suppository 650 mg, 650 mg, Rectal, Q4H PRN, Deveshwar, Sanjeev, MD   aspirin  chewable tablet 81 mg, 81 mg, Oral, Daily **OR** aspirin  chewable tablet 81 mg,  81 mg, Per Tube, Daily, Deveshwar, Sanjeev, MD   Chlorhexidine  Gluconate Cloth 2 % PADS 6 each, 6 each, Topical, Daily, Deveshwar, Sanjeev, MD, 6 each at 12/05/23 1555   clevidipine  (CLEVIPREX ) infusion 0.5 mg/mL, 0-21 mg/hr, Intravenous, Continuous, Deveshwar, Sanjeev, MD, Stopped at 12/05/23 2306   clopidogrel  (PLAVIX ) tablet 75 mg, 75 mg, Oral, Daily **OR** clopidogrel  (PLAVIX ) tablet 75 mg, 75 mg, Per Tube, Daily, Deveshwar, Sanjeev, MD   Oral care mouth rinse, 15 mL, Mouth Rinse, PRN, Luellen Sages, MD  Labs: CBC Recent Labs    12/05/23 0550 12/06/23 0616  WBC 5.0 5.6  HGB 11.3* 8.5*  HCT 35.0* 25.9*  PLT 193 140*   BMET Recent Labs    12/05/23 0550 12/06/23 0616  NA 140 141  K 3.5 3.1*  CL 105 116*  CO2 22 18*  GLUCOSE 85 84  BUN 24* 13  CREATININE 1.06* 0.85  CALCIUM 9.7 7.9*   LFT No results for input(s): "PROT", "ALBUMIN", "AST", "ALT", "ALKPHOS", "BILITOT", "BILIDIR", "IBILI", "LIPASE" in the last 72 hours. PT/INR Recent Labs    12/05/23 0550  LABPROT 13.1  INR 1.0     Studies/Results: No results found.  Assessment/Plan: aneurysm of the right posterior communicating artery   Patient is 1 day post angiogram with intervention.  Patient reports bleeding in the right ear. PT-INR, PTT and P2Y12 ordered. Advance diet.  Get up in bedside chair and walk. Follow up outpatient in 2 weeks. No bending, lifting, or lifting/carrying more than 10 pounds at a time. No driving. Restrictions in place for at least 2 weeks.  Pending discharge today after lab  results are back.     LOS: 1 day   I spent a total of 15 minutes in face to face in clinical consultation, greater than 50% of which was counseling/coordinating care for aneurysm of the right posterior communicating artery   Marcellene Shivley N Eowyn Tabone PA-C 12/06/2023 8:12 AM

## 2023-12-06 NOTE — Telephone Encounter (Signed)
 Patient's daughter, Francenia Mccullar, called on behalf of her mother requesting Rx be called in for ear drops.  Primary Team told patient at discharge that Dr. Soldatova would be putting in the order for that.  Dr. Larkin Plumb consulted for sanguinous drainage, bleeding from the Right ear.  Dr. Soldatova has called in a Rx for ear drops to [facility] on [address]  Relayed message to patient's daughter and scheduled follow up appt in 2 weeks per Dr. Adelia Adolphus request.

## 2023-12-07 ENCOUNTER — Other Ambulatory Visit (HOSPITAL_COMMUNITY): Payer: Self-pay | Admitting: Interventional Radiology

## 2023-12-07 ENCOUNTER — Telehealth (INDEPENDENT_AMBULATORY_CARE_PROVIDER_SITE_OTHER): Payer: Self-pay

## 2023-12-07 DIAGNOSIS — I671 Cerebral aneurysm, nonruptured: Secondary | ICD-10-CM

## 2023-12-07 LAB — POCT ACTIVATED CLOTTING TIME
Activated Clotting Time: 268 s
Activated Clotting Time: 268 s
Activated Clotting Time: 268 s

## 2023-12-07 NOTE — Telephone Encounter (Signed)
 Left a message to see if she had gotten her ear drops

## 2023-12-08 NOTE — Telephone Encounter (Signed)
 See "phone contacts" for 11/04/23 conversation documentation. Note placed to fulfill epic requirements to close encounter.

## 2023-12-09 NOTE — Anesthesia Postprocedure Evaluation (Signed)
 Anesthesia Post Note  Patient: Tammy Aguirre  Procedure(s) Performed: RADIOLOGY WITH ANESTHESIA     Patient location during evaluation: PACU Anesthesia Type: General Level of consciousness: awake and patient cooperative Pain management: pain level controlled Vital Signs Assessment: post-procedure vital signs reviewed and stable Respiratory status: spontaneous breathing, nonlabored ventilation and respiratory function stable Cardiovascular status: blood pressure returned to baseline and stable Postop Assessment: no apparent nausea or vomiting Anesthetic complications: no   No notable events documented.               Antone Summons

## 2023-12-19 ENCOUNTER — Ambulatory Visit (INDEPENDENT_AMBULATORY_CARE_PROVIDER_SITE_OTHER): Admitting: Otolaryngology

## 2023-12-19 ENCOUNTER — Ambulatory Visit (HOSPITAL_COMMUNITY)
Admission: RE | Admit: 2023-12-19 | Discharge: 2023-12-19 | Disposition: A | Discharge status: Home / Self Care | Source: Ambulatory Visit

## 2023-12-19 ENCOUNTER — Encounter (INDEPENDENT_AMBULATORY_CARE_PROVIDER_SITE_OTHER): Payer: Self-pay | Admitting: Otolaryngology

## 2023-12-19 VITALS — BP 121/78 | HR 71

## 2023-12-19 DIAGNOSIS — H6123 Impacted cerumen, bilateral: Secondary | ICD-10-CM

## 2023-12-19 DIAGNOSIS — Z7901 Long term (current) use of anticoagulants: Secondary | ICD-10-CM

## 2023-12-19 DIAGNOSIS — H9221 Otorrhagia, right ear: Secondary | ICD-10-CM

## 2023-12-19 DIAGNOSIS — I671 Cerebral aneurysm, nonruptured: Secondary | ICD-10-CM

## 2023-12-19 DIAGNOSIS — H9313 Tinnitus, bilateral: Secondary | ICD-10-CM

## 2023-12-19 DIAGNOSIS — H9319 Tinnitus, unspecified ear: Secondary | ICD-10-CM | POA: Diagnosis not present

## 2023-12-19 DIAGNOSIS — H9193 Unspecified hearing loss, bilateral: Secondary | ICD-10-CM

## 2023-12-19 MED ORDER — OFLOXACIN 0.3 % OT SOLN: 5.0000 [drp] | Freq: Two times a day (BID) | OTIC | 0 refills | Status: AC

## 2023-12-19 NOTE — Progress Notes (Signed)
 ENT CONSULT:  Reason for Consult: right ear bleeding    HPI: Discussed the use of AI scribe software for clinical note transcription with the patient, who gave verbal consent to proceed.  History of Present Illness Tammy Aguirre is a 63 year old female on blood thinners following a recent IR procedure for right ICA aneurysm on 12/05/2023 who presents with ear bleeding and earwax buildup as well as bilateral tinnitus and decreased hearing.   She developed wheezing from the right ear canal while admitted for observation following IR procedure.  She denied any history of ear trauma.  She was prescribed eardrops after exam while she was inpatient revealed cerumen and dried blood in the ear canal. She used prescribed eardrops in her ears to manage earwax buildup, which has become more liquid.  She applied ofloxacin  eardrops only in her right ear. She acknowledges that her use of blood thinners may contribute to minor bleeding episodes.  She denies any significant bleeding from her ear since her discharge from the hospital.  She reports muffled hearing and tinnitus bilaterally.  Tinnitus is nonpulsatile and is not associated with dizziness or vertigo.    Records Reviewed:  Consult note and summary of hospital stay from 12/06/23 Reason for Consult: right ear bleeding s/p IR procedure for ICA aneurysm    HPI:   [name] admitted following right ICA aneurysm IR procedure, with right ear bleeding this am. Received heparin  for the procedure, stopped this am, on Plavix . Denies ear trauma. Denies episodes of bleeding like this before. Hearing is muffled, but she denies pain. No hx of ear infections or ear surgeries. Bleeding resolved since it started. ENT was consulted for evaluation.       Past Medical History:  Diagnosis Date   Anemia    History of   Arthritis    Right Shoulder   History of TIA (transient ischemic attack)    Hypertension    Migraines    History of   PONV (postoperative nausea  and vomiting)     Past Surgical History:  Procedure Laterality Date   ABDOMINAL HYSTERECTOMY     CESAREAN SECTION     x 1   COLONOSCOPY     IR 3D INDEPENDENT WKST  10/17/2023   IR ANGIO INTRA EXTRACRAN SEL INTERNAL CAROTID UNI R MOD SED  12/05/2023   IR ANGIO VERTEBRAL SEL VERTEBRAL BILAT MOD SED  10/17/2023   IR ANGIOGRAM FOLLOW UP STUDY  10/21/2023   IR CT HEAD LTD  10/17/2023   IR CT HEAD LTD  12/05/2023   IR NEURO EACH ADD'L AFTER BASIC UNI RIGHT (MS)  10/21/2023   IR NEURO EACH ADD'L AFTER BASIC UNI RIGHT (MS)  12/05/2023   IR RADIOLOGIST EVAL & MGMT  09/30/2023   MRI   IR RADIOLOGIST EVAL & MGMT  11/04/2023   IR TRANSCATH/EMBOLIZ  10/17/2023   IR TRANSCATH/EMBOLIZ  12/05/2023   NECK SURGERY     plate in neck   RADIOLOGY WITH ANESTHESIA N/A 10/17/2023   Procedure: RADIOLOGY WITH ANESTHESIA;  Surgeon: Luellen Sages, MD;  Location: [facility] OR;  Service: Radiology;  Laterality: N/A;  Angiogram with intent to treat   RADIOLOGY WITH ANESTHESIA N/A 12/05/2023   Procedure: RADIOLOGY WITH ANESTHESIA;  Surgeon: Luellen Sages, MD;  Location: [facility] OR;  Service: Radiology;  Laterality: N/A;  Endovascular treatment of the second aneurysm in the right posterior communicating artery region   SHOULDER ARTHROSCOPY WITH OPEN ROTATOR CUFF REPAIR AND DISTAL CLAVICLE ACROMINECTOMY Right 11/24/2021  Procedure: RIGHT SHOULDER ARTHROSCOPIC SUBACROMIAL DECOMPRESSION WITH OPEN ROTATOR CUFF REPAIR AND DISTAL CLAVICLE ACROMINECTOMY;  Surgeon: Shirlee Dotter, MD;  Location: [facility];  Service: Orthopedics;  Laterality: Right;   UPPER GI ENDOSCOPY      History reviewed. No pertinent family history.  Social History:  reports that she has never smoked. She has never used smokeless tobacco. She reports that she does not drink alcohol and does not use drugs.  Allergies:  Allergies  Allergen Reactions   Tetracyclines & Related Rash    Medications: I have reviewed the patient's current medications.  The PMH, PSH,  Medications, Allergies, and SH were reviewed and updated.  ROS: Constitutional: Negative for fever, weight loss and weight gain. Cardiovascular: Negative for chest pain and dyspnea on exertion. Respiratory: Is not experiencing shortness of breath at rest. Gastrointestinal: Negative for nausea and vomiting. Neurological: Negative for headaches. Psychiatric: The patient is not nervous/anxious  Blood pressure 121/78, pulse 71, SpO2 98%.  PHYSICAL EXAM:  Exam: General: Well-developed, well-nourished Respiratory Respiratory effort: Equal inspiration and expiration without stridor Cardiovascular Peripheral Vascular: Warm extremities with equal color/perfusion Eyes: No nystagmus with equal extraocular motion bilaterally Neuro/Psych/Balance: Patient oriented to person, place, and time; Appropriate mood and affect; Gait is intact with no imbalance; Cranial nerves I-XII are intact Head and Face Inspection: Normocephalic and atraumatic without mass or lesion Palpation: Facial skeleton intact without bony stepoffs Salivary Glands: No mass or tenderness Facial Strength: Facial motility symmetric and full bilaterally ENT Pinna: External ear intact and fully developed External canal: Canal is patent with intact skin bilateral cerumen impaction Right side with dry cerumen and dried blood, removed, no evidence of active bleeding, no erythema or edema of the ear canal Left side with cerumen impaction, removed Tympanic Membrane: Clear and mobile, no eardrum perforation External Nose: No scar or anatomic deformity Internal Nose: Septum intact and midline. No edema, polyp, or rhinorrhea Lips, Teeth, and gums: Mucosa and teeth intact and viable TMJ: No pain to palpation with full mobility Oral cavity/oropharynx: No erythema or exudate, no lesions present Neck Neck and Trachea: Midline trachea without mass or lesion Thyroid : No mass or nodularity Lymphatics: No  lymphadenopathy  Procedure: Procedure: Cerumen Removal, Bilateral (CPT [id number])  Diagnosis: cerumen impaction, bilateral   Informed consent: Timeout performed and informed consent was obtained.  Procedure: Operating microscope was employed to evaluate the ear(s).  Cerumen curette, speculum and suction were employed to clear the cerumen.   Findings: Normal appearing tympanic membranes without perforations, and external canals are normal after removal of cerumen.No middle ear fluid bilaterally.   Complications: None. Patient tolerated well.    Assessment/Plan: Encounter Diagnoses  Name Primary?   Ear bleeding, right Yes   Tinnitus of both ears    Decreased hearing of both ears    Anticoagulated     Assessment and Plan Assessment & Plan Right ear bleeding following IR procedure, anticoagulated Earwax buildup with minor bleeding on exam, removed successfully Earwax buildup with minor bleeding in the right ear, likely due to irritation and anticoagulant use. No active bleeding or infection, but potential for infection if moisture persists. - Refill and continue ear drops for two weeks in both ears. - Re-examine ears after two weeks. - Advised to avoid inserting objects larger than a finger into the ear. -Dry ear precautions for now  Decreased hearing and tinnitus - Schedule a hearing test for the follow-up visit.     Thank you for allowing me to participate in the care of  this patient. Please do not hesitate to contact me with any questions or concerns.   Artice Last, MD Otolaryngology [facility] Phone: [phone] Fax: [phone]    12/19/2023, 11:14 AM

## 2023-12-20 HISTORY — PX: IR RADIOLOGIST EVAL & MGMT: IMG5224

## 2023-12-27 ENCOUNTER — Telehealth (HOSPITAL_COMMUNITY): Payer: Self-pay | Admitting: Student

## 2023-12-27 ENCOUNTER — Telehealth (INDEPENDENT_AMBULATORY_CARE_PROVIDER_SITE_OTHER): Payer: Self-pay

## 2023-12-27 MED ORDER — CLOPIDOGREL BISULFATE 75 MG PO TABS: 75.0000 mg | ORAL_TABLET | Freq: Every day | ORAL | 6 refills | Status: DC

## 2023-12-27 NOTE — Telephone Encounter (Signed)
 Patient called concerned about hearing loss in her left ear. Moved up patient's follow up.

## 2023-12-27 NOTE — Telephone Encounter (Signed)
 Plavix  prescription refilled.   Esmirna Ravan, AGACNP-BC 12/27/2023, 11:53 AM

## 2023-12-29 DIAGNOSIS — H53451 Other localized visual field defect, right eye: Secondary | ICD-10-CM | POA: Diagnosis not present

## 2024-01-02 ENCOUNTER — Ambulatory Visit (INDEPENDENT_AMBULATORY_CARE_PROVIDER_SITE_OTHER): Admitting: Audiology

## 2024-01-02 ENCOUNTER — Ambulatory Visit (INDEPENDENT_AMBULATORY_CARE_PROVIDER_SITE_OTHER): Admitting: Otolaryngology

## 2024-01-02 DIAGNOSIS — H6122 Impacted cerumen, left ear: Secondary | ICD-10-CM

## 2024-01-02 DIAGNOSIS — Z0110 Encounter for examination of ears and hearing without abnormal findings: Secondary | ICD-10-CM

## 2024-01-02 DIAGNOSIS — H9313 Tinnitus, bilateral: Secondary | ICD-10-CM

## 2024-01-02 DIAGNOSIS — H9193 Unspecified hearing loss, bilateral: Secondary | ICD-10-CM

## 2024-01-02 MED ORDER — CIPROFLOXACIN-DEXAMETHASONE 0.3-0.1 % OT SUSP: 4.0000 [drp] | Freq: Two times a day (BID) | OTIC | 0 refills | Status: AC

## 2024-01-02 NOTE — Progress Notes (Signed)
  [address], [address] [place], [place] [address] [phone]  Audiological Evaluation    Name: Tammy Aguirre     DOB:   Dec 24, 1960      MRN:   [id number]                                                                                     Service Date: 01/02/2024     Accompanied by: unaccompanied   Patient comes today after Dr. Soldatova, ENT sent a referral for a hearing evaluation due to concerns with hearing loss.   Symptoms Yes Details  Hearing loss  []    Tinnitus  []    Ear pain/ infections/pressure  [x]  Had right ear bleeding after brain aneurism  Balance problems  []    Noise exposure history  []    Previous ear surgeries  []    Family history of hearing loss  []    Amplification  []    Other  []      Otoscopy: Right ear: Clear external ear canal and notable landmarks visualized on the tympanic membrane. Left ear:  Abnormal external ear canal/ irritated.  Tympanometry: Right ear: Type A- Normal external ear canal volume with normal middle ear pressure and tympanic membrane compliance. Left ear: Type A- Normal external ear canal volume with normal middle ear pressure and tympanic membrane compliance.  Pure tone Audiometry: Normal hearing from 125 Hz - 8000 Hz, in both ears.  Speech Audiometry: Right ear- Speech Reception Threshold (SRT) was obtained at 10 dBHL. Left ear-Speech Reception Threshold (SRT) was obtained at 15 dBHL.   Word Recognition Score Tested using NU-6 (MLV) Right ear: 100% was obtained at a presentation level of 55 dBHL with contralateral masking which is deemed as  excellent. Left ear: 100% was obtained at a presentation level of 55 dBHL with contralateral masking which is deemed as  excellent.   The hearing test results were completed under headphones and results are deemed to be of good reliability. Test technique:  conventional     Recommendations: Follow up with ENT as scheduled for today. Return for a hearing evaluation if concerns  with hearing changes arise or per MD recommendation.   [name], AUD

## 2024-01-02 NOTE — Progress Notes (Signed)
 ENT Progress Note:   Update 01/02/2024  Discussed the use of AI scribe software for clinical note transcription with the patient, who gave verbal consent to proceed.  History of Present Illness She returns for f/u, after last visit felt left ear hearing was muffled. Here for ear check and to have hearing test. No ear bleeding. Finished ear drops. No ear pain, dizziness or vertigo.   Records Reviewed:  Initial Evaluation  Reason for Consult: right ear bleeding    HPI: Discussed the use of AI scribe software for clinical note transcription with the patient, who gave verbal consent to proceed.  History of Present Illness Tammy Aguirre is a 63 year old female on blood thinners following a recent IR procedure for right ICA aneurysm on 12/05/2023 who presents with ear bleeding and earwax buildup as well as bilateral tinnitus and decreased hearing.   She developed wheezing from the right ear canal while admitted for observation following IR procedure.  She denied any history of ear trauma.  She was prescribed eardrops after exam while she was inpatient revealed cerumen and dried blood in the ear canal. She used prescribed eardrops in her ears to manage earwax buildup, which has become more liquid.  She applied ofloxacin  eardrops only in her right ear. She acknowledges that her use of blood thinners may contribute to minor bleeding episodes.  She denies any significant bleeding from her ear since her discharge from the hospital.  She reports muffled hearing and tinnitus bilaterally.  Tinnitus is nonpulsatile and is not associated with dizziness or vertigo.    Records Reviewed:  Consult note and summary of hospital stay from 12/06/23 Reason for Consult: right ear bleeding s/p IR procedure for ICA aneurysm    HPI:   3 yoF admitted following right ICA aneurysm IR procedure, with right ear bleeding this am. Received heparin  for the procedure, stopped this am, on Plavix . Denies ear trauma. Denies  episodes of bleeding like this before. Hearing is muffled, but she denies pain. No hx of ear infections or ear surgeries. Bleeding resolved since it started. ENT was consulted for evaluation.       Past Medical History:  Diagnosis Date   Anemia    History of   Arthritis    Right Shoulder   History of TIA (transient ischemic attack)    Hypertension    Migraines    History of   PONV (postoperative nausea and vomiting)     Past Surgical History:  Procedure Laterality Date   ABDOMINAL HYSTERECTOMY     CESAREAN SECTION     x 1   COLONOSCOPY     IR 3D INDEPENDENT WKST  10/17/2023   IR ANGIO INTRA EXTRACRAN SEL INTERNAL CAROTID UNI R MOD SED  12/05/2023   IR ANGIO VERTEBRAL SEL VERTEBRAL BILAT MOD SED  10/17/2023   IR ANGIOGRAM FOLLOW UP STUDY  10/21/2023   IR CT HEAD LTD  10/17/2023   IR CT HEAD LTD  12/05/2023   IR NEURO EACH ADD'L AFTER BASIC UNI RIGHT (MS)  10/21/2023   IR NEURO EACH ADD'L AFTER BASIC UNI RIGHT (MS)  12/05/2023   IR RADIOLOGIST EVAL & MGMT  09/30/2023   MRI   IR RADIOLOGIST EVAL & MGMT  11/04/2023   IR RADIOLOGIST EVAL & MGMT  12/20/2023   IR TRANSCATH/EMBOLIZ  10/17/2023   IR TRANSCATH/EMBOLIZ  12/05/2023   NECK SURGERY     plate in neck   RADIOLOGY WITH ANESTHESIA N/A 10/17/2023   Procedure:  RADIOLOGY WITH ANESTHESIA;  Surgeon: Luellen Sages, MD;  Location: [facility];  Service: Radiology;  Laterality: N/A;  Angiogram with intent to treat   RADIOLOGY WITH ANESTHESIA N/A 12/05/2023   Procedure: RADIOLOGY WITH ANESTHESIA;  Surgeon: Luellen Sages, MD;  Location: [facility] OR;  Service: Radiology;  Laterality: N/A;  Endovascular treatment of the second aneurysm in the right posterior communicating artery region   SHOULDER ARTHROSCOPY WITH OPEN ROTATOR CUFF REPAIR AND DISTAL CLAVICLE ACROMINECTOMY Right 11/24/2021   Procedure: RIGHT SHOULDER ARTHROSCOPIC SUBACROMIAL DECOMPRESSION WITH OPEN ROTATOR CUFF REPAIR AND DISTAL CLAVICLE ACROMINECTOMY;  Surgeon: Shirlee Dotter, MD;   Location: [facility];  Service: Orthopedics;  Laterality: Right;   UPPER GI ENDOSCOPY      No family history on file.  Social History:  reports that she has never smoked. She has never used smokeless tobacco. She reports that she does not drink alcohol and does not use drugs.  Allergies:  Allergies  Allergen Reactions   Tetracyclines & Related Rash    Medications: I have reviewed the patient's current medications.  The PMH, PSH, Medications, Allergies, and SH were reviewed and updated.  ROS: Constitutional: Negative for fever, weight loss and weight gain. Cardiovascular: Negative for chest pain and dyspnea on exertion. Respiratory: Is not experiencing shortness of breath at rest. Gastrointestinal: Negative for nausea and vomiting. Neurological: Negative for headaches. Psychiatric: The patient is not nervous/anxious  There were no vitals taken for this visit.  PHYSICAL EXAM:  Exam: General: Well-developed, well-nourished Respiratory Respiratory effort: Equal inspiration and expiration without stridor Cardiovascular Peripheral Vascular: Warm extremities with equal color/perfusion Eyes: No nystagmus with equal extraocular motion bilaterally Neuro/Psych/Balance: Patient oriented to person, place, and time; Appropriate mood and affect; Gait is intact with no imbalance; Cranial nerves I-XII are intact Head and Face Inspection: Normocephalic and atraumatic without mass or lesion Palpation: Facial skeleton intact without bony stepoffs Salivary Glands: No mass or tenderness Facial Strength: Facial motility symmetric and full bilaterally ENT Pinna: External ear intact and fully developed External canal: Canal is patent with residual cerumen on the left, cleared  Right side with dry cerumen left side, removed Left side with cerumen impaction, removed Tympanic Membrane: Clear and mobile, no eardrum perforation External Nose: No scar or anatomic deformity Internal Nose: Septum intact  and midline. No edema, polyp, or rhinorrhea Lips, Teeth, and gums: Mucosa and teeth intact and viable TMJ: No pain to palpation with full mobility Oral cavity/oropharynx: No erythema or exudate, no lesions present Neck Neck and Trachea: Midline trachea without mass or lesion Thyroid : No mass or nodularity Lymphatics: No lymphadenopathy  Procedure: Procedure: Cerumen Removal, left (CPT [phone])  Diagnosis: cerumen impaction, left  Informed consent: Timeout performed and informed consent was obtained.  Procedure: Operating microscope was employed to evaluate the ear(s).  Cerumen curette, speculum and suction were employed to clear the cerumen.   Findings: Normal appearing tympanic membranes without perforations on the left   Complications: None. Patient tolerated well.   Audiogram today  Normal hearing AU WDS 100% AU at 55 dB HTL   Assessment/Plan: Encounter Diagnoses  Name Primary?   Impacted cerumen of left ear Yes   Tinnitus of both ears    Decreased hearing of both ears      Assessment and Plan Assessment & Plan Right ear bleeding following IR procedure, anticoagulated Earwax buildup with minor bleeding on exam, removed successfully Earwax buildup with minor bleeding in the right ear, likely due to irritation and anticoagulant use. No active bleeding or  infection, but potential for infection if moisture persists. - Refill and continue ear drops for two weeks in both ears. - Re-examine ears after two weeks. - Advised to avoid inserting objects larger than a finger into the ear. -Dry ear precautions for now  Decreased hearing and tinnitus - Schedule a hearing test for the follow-up visit.   Update 01/02/24 Assessment & Plan We removed residual cerumen on the left side today and she had hearing test which demonstrated normal hearing AU. Had minimal canal irritation on the left side after cerumen removal and we agreed to do a few days of Ciprodex  on the left side. Rx  sent. We discussed normal hearing test results and she will see us  PRN at this time.    Thank you for allowing me to participate in the care of this patient. Please do not hesitate to contact me with any questions or concerns.   Artice Last, MD Otolaryngology [facility] Phone: [phone] Fax: [phone]    01/02/2024, 10:52 AM

## 2024-01-09 ENCOUNTER — Encounter: Payer: Self-pay | Admitting: Audiology

## 2024-01-10 ENCOUNTER — Telehealth (HOSPITAL_COMMUNITY): Payer: Self-pay | Admitting: Student

## 2024-01-10 MED ORDER — CLOPIDOGREL BISULFATE 75 MG PO TABS
75.0000 mg | ORAL_TABLET | Freq: Every day | ORAL | 11 refills | Status: DC
Start: 2024-01-10 — End: 2024-02-22

## 2024-01-10 NOTE — Telephone Encounter (Signed)
 Plavix  refill e-prescribed to [facility] per patient request.  Jetta Morrow, AGACNP-BC 01/10/2024, 10:35 AM

## 2024-01-24 ENCOUNTER — Ambulatory Visit (INDEPENDENT_AMBULATORY_CARE_PROVIDER_SITE_OTHER): Admitting: Audiology

## 2024-01-24 ENCOUNTER — Ambulatory Visit (INDEPENDENT_AMBULATORY_CARE_PROVIDER_SITE_OTHER): Admitting: Otolaryngology

## 2024-01-27 ENCOUNTER — Encounter (HOSPITAL_COMMUNITY): Payer: Self-pay | Admitting: Interventional Radiology

## 2024-02-21 ENCOUNTER — Other Ambulatory Visit (HOSPITAL_COMMUNITY): Payer: Self-pay

## 2024-02-21 ENCOUNTER — Telehealth: Payer: Self-pay

## 2024-02-21 ENCOUNTER — Other Ambulatory Visit: Payer: Self-pay

## 2024-02-21 DIAGNOSIS — K625 Hemorrhage of anus and rectum: Secondary | ICD-10-CM

## 2024-02-21 LAB — CBC
HCT: 34.3 % — ABNORMAL LOW (ref 36.0–46.0)
Hemoglobin: 11.1 g/dL — ABNORMAL LOW (ref 12.0–15.0)
MCH: 28.5 pg (ref 26.0–34.0)
MCHC: 32.4 g/dL (ref 30.0–36.0)
MCV: 88.2 fL (ref 80.0–100.0)
Platelets: 200 K/uL (ref 150–400)
RBC: 3.89 MIL/uL (ref 3.87–5.11)
RDW: 13.6 % (ref 11.5–15.5)
WBC: 6 K/uL (ref 4.0–10.5)
nRBC: 0 % (ref 0.0–0.2)

## 2024-02-21 LAB — PLATELET INHIBITION P2Y12: Platelet Function  P2Y12: 27 [PRU] — ABNORMAL LOW (ref 182–335)

## 2024-02-21 NOTE — Telephone Encounter (Signed)
 Spoke to patient via phone. Pt called to report concern for increased bruising to arms and legs, L flank pain (no dysuria or blood in urine), and bright red rectal bleeding (seen in toilet and on paper when wiping, no clots, h/o hemorrhoids but has only ever bled after large/hard BM).  Denies SOB, palpitations, weakness, dizziness, abd pain, emesis. Endorses nausea that she associates with her medications.  She is on plavix  75mg  daily and asa 81 mg daily for 5/5 intervention with Dr. Dolphus: Status post endovascular treatment of wide neck mildly lobulated aneurysm arising at the junction of the dominant right posterior communicating artery with the right internal carotid artery posterior wall using a pipeline Advantage 3.5 mm x 16 mm device with stasis.  Recommended CBC and p2y12. Schedulers aware and will give patient directions for getting this drawn.

## 2024-02-21 NOTE — Telephone Encounter (Signed)
 Discussed case with Dr. Dolphus and called patient back to update her:  CBC results shared with patient - reassuring. She confirms that she has only had scant blood present in subsequent trips to restroom. Discussed sx which should prompt her to present to ER prior to call back from us  tomorrow (we will call her tomorrow when p2y12 avail to discuss any needed dosing changes for plavix ).   She is aware that she needs to f/u with PCP for flank pain, given absence of hematuria, unlikely related to plavix /asa regimen.

## 2024-02-22 ENCOUNTER — Other Ambulatory Visit: Payer: Self-pay | Admitting: Internal Medicine

## 2024-02-22 ENCOUNTER — Telehealth: Payer: Self-pay | Admitting: Student

## 2024-02-22 DIAGNOSIS — K625 Hemorrhage of anus and rectum: Secondary | ICD-10-CM | POA: Diagnosis not present

## 2024-02-22 DIAGNOSIS — R109 Unspecified abdominal pain: Secondary | ICD-10-CM

## 2024-02-22 DIAGNOSIS — R5383 Other fatigue: Secondary | ICD-10-CM | POA: Diagnosis not present

## 2024-02-22 DIAGNOSIS — T148XXA Other injury of unspecified body region, initial encounter: Secondary | ICD-10-CM | POA: Diagnosis not present

## 2024-02-22 DIAGNOSIS — I671 Cerebral aneurysm, nonruptured: Secondary | ICD-10-CM

## 2024-02-22 MED ORDER — CLOPIDOGREL BISULFATE 75 MG PO TABS: 37.5000 mg | ORAL_TABLET | Freq: Every day | ORAL | Status: AC

## 2024-02-22 NOTE — Telephone Encounter (Signed)
 Followed up with patient today re: recent bruising/bleeding of Plavix  75 mg daily, aspirin  81mg  daily.  Her P2Y12 returned at 27.  Discussed with Dr. Dolphus who recommends decreasing Plavix  to 37.5 mg daily.  Maintain aspirin  81mg  daily.  Called and discussed with patient who is agreeable.   Tammy Vosler, MS RD PA-C

## 2024-02-24 ENCOUNTER — Ambulatory Visit
Admission: RE | Admit: 2024-02-24 | Discharge: 2024-02-24 | Disposition: A | Discharge status: Home / Self Care | Source: Ambulatory Visit | Attending: Internal Medicine | Admitting: Internal Medicine

## 2024-02-24 DIAGNOSIS — R109 Unspecified abdominal pain: Secondary | ICD-10-CM

## 2024-04-12 DIAGNOSIS — M5430 Sciatica, unspecified side: Secondary | ICD-10-CM | POA: Diagnosis not present

## 2024-04-12 DIAGNOSIS — M545 Low back pain, unspecified: Secondary | ICD-10-CM | POA: Diagnosis not present

## 2024-04-12 DIAGNOSIS — M79671 Pain in right foot: Secondary | ICD-10-CM | POA: Diagnosis not present

## 2024-04-16 ENCOUNTER — Telehealth (HOSPITAL_COMMUNITY): Payer: Self-pay

## 2024-04-16 NOTE — Telephone Encounter (Signed)
 Pt called wanting to know about a new physician since Dr. Dolphus is not here any longer. Gave her a couple names. She will research and give us  a call back. AB

## 2024-04-17 ENCOUNTER — Other Ambulatory Visit (HOSPITAL_COMMUNITY): Payer: Self-pay | Admitting: Radiology

## 2024-04-17 ENCOUNTER — Telehealth (HOSPITAL_COMMUNITY): Payer: Self-pay

## 2024-04-17 DIAGNOSIS — I671 Cerebral aneurysm, nonruptured: Secondary | ICD-10-CM

## 2024-04-17 NOTE — Telephone Encounter (Signed)
 Pt called and left a vm that she would like to get referred to Dr. Lanis. AB

## 2024-04-23 DIAGNOSIS — I671 Cerebral aneurysm, nonruptured: Secondary | ICD-10-CM | POA: Diagnosis not present

## 2024-04-23 DIAGNOSIS — Z6825 Body mass index (BMI) 25.0-25.9, adult: Secondary | ICD-10-CM | POA: Diagnosis not present

## 2024-04-24 ENCOUNTER — Other Ambulatory Visit: Payer: Self-pay | Admitting: Neurosurgery

## 2024-04-24 DIAGNOSIS — I671 Cerebral aneurysm, nonruptured: Secondary | ICD-10-CM

## 2024-05-10 ENCOUNTER — Other Ambulatory Visit (HOSPITAL_COMMUNITY)

## 2024-05-11 ENCOUNTER — Other Ambulatory Visit: Payer: Self-pay | Admitting: Obstetrics and Gynecology

## 2024-05-11 DIAGNOSIS — Z1231 Encounter for screening mammogram for malignant neoplasm of breast: Secondary | ICD-10-CM

## 2024-05-15 ENCOUNTER — Other Ambulatory Visit (HOSPITAL_COMMUNITY)

## 2024-05-25 ENCOUNTER — Other Ambulatory Visit: Payer: Self-pay | Admitting: Neurosurgery

## 2024-05-25 ENCOUNTER — Ambulatory Visit (HOSPITAL_COMMUNITY)
Admission: RE | Admit: 2024-05-25 | Discharge: 2024-05-25 | Disposition: A | Discharge status: Home / Self Care | Source: Ambulatory Visit | Attending: Neurosurgery | Admitting: Neurosurgery

## 2024-05-25 ENCOUNTER — Other Ambulatory Visit: Payer: Self-pay

## 2024-05-25 DIAGNOSIS — I671 Cerebral aneurysm, nonruptured: Secondary | ICD-10-CM | POA: Diagnosis not present

## 2024-05-25 DIAGNOSIS — I6609 Occlusion and stenosis of unspecified middle cerebral artery: Secondary | ICD-10-CM | POA: Diagnosis not present

## 2024-05-25 DIAGNOSIS — Z7982 Long term (current) use of aspirin: Secondary | ICD-10-CM | POA: Insufficient documentation

## 2024-05-25 DIAGNOSIS — Z7902 Long term (current) use of antithrombotics/antiplatelets: Secondary | ICD-10-CM | POA: Diagnosis not present

## 2024-05-25 HISTORY — PX: IR US GUIDE VASC ACCESS RIGHT: IMG2390

## 2024-05-25 HISTORY — PX: IR ANGIO INTRA EXTRACRAN SEL INTERNAL CAROTID BILAT MOD SED: IMG5363

## 2024-05-25 LAB — CBC WITH DIFFERENTIAL/PLATELET
Abs Immature Granulocytes: 0.01 K/uL (ref 0.00–0.07)
Basophils Absolute: 0.1 K/uL (ref 0.0–0.1)
Basophils Relative: 1 %
Eosinophils Absolute: 0.1 K/uL (ref 0.0–0.5)
Eosinophils Relative: 3 %
HCT: 34.8 % — ABNORMAL LOW (ref 36.0–46.0)
Hemoglobin: 11.2 g/dL — ABNORMAL LOW (ref 12.0–15.0)
Immature Granulocytes: 0 %
Lymphocytes Relative: 24 %
Lymphs Abs: 1 K/uL (ref 0.7–4.0)
MCH: 28.4 pg (ref 26.0–34.0)
MCHC: 32.2 g/dL (ref 30.0–36.0)
MCV: 88.1 fL (ref 80.0–100.0)
Monocytes Absolute: 0.4 K/uL (ref 0.1–1.0)
Monocytes Relative: 10 %
Neutro Abs: 2.7 K/uL (ref 1.7–7.7)
Neutrophils Relative %: 62 %
Platelets: 187 K/uL (ref 150–400)
RBC: 3.95 MIL/uL (ref 3.87–5.11)
RDW: 15.1 % (ref 11.5–15.5)
WBC: 4.3 K/uL (ref 4.0–10.5)
nRBC: 0 % (ref 0.0–0.2)

## 2024-05-25 LAB — BASIC METABOLIC PANEL WITH GFR
Anion gap: 11 (ref 5–15)
BUN: 24 mg/dL — ABNORMAL HIGH (ref 8–23)
CO2: 24 mmol/L (ref 22–32)
Calcium: 9 mg/dL (ref 8.9–10.3)
Chloride: 104 mmol/L (ref 98–111)
Creatinine, Ser: 1.03 mg/dL — ABNORMAL HIGH (ref 0.44–1.00)
GFR, Estimated: >60 mL/min (ref >60)
Glucose, Bld: 91 mg/dL (ref 70–99)
Potassium: 3.5 mmol/L (ref 3.5–5.1)
Sodium: 139 mmol/L (ref 135–145)

## 2024-05-25 LAB — PROTIME-INR
INR: 1 (ref 0.8–1.2)
Prothrombin Time: 13.4 s (ref 11.4–15.2)

## 2024-05-25 LAB — APTT: aPTT: 28 s (ref 24–36)

## 2024-05-25 MED ORDER — LIDOCAINE HCL 1 % IJ SOLN
20.0000 mL | Freq: Once | INTRAMUSCULAR | Status: AC
  Administered 2024-05-25: 10 mL via INTRADERMAL

## 2024-05-25 MED ORDER — VERAPAMIL HCL 2.5 MG/ML IV SOLN: INTRA_ARTERIAL | Status: AC | PRN

## 2024-05-25 MED ORDER — VERAPAMIL HCL 2.5 MG/ML IV SOLN
INTRAVENOUS | Status: AC
  Filled 2024-05-25: qty 2

## 2024-05-25 MED ORDER — HYDROCODONE-ACETAMINOPHEN 5-325 MG PO TABS: 1.0000 | ORAL_TABLET | ORAL | Status: DC | PRN

## 2024-05-25 MED ORDER — FENTANYL CITRATE (PF) 100 MCG/2ML IJ SOLN
INTRAMUSCULAR | Status: AC | PRN
  Administered 2024-05-25: 25 ug via INTRAVENOUS

## 2024-05-25 MED ORDER — CHLORHEXIDINE GLUCONATE CLOTH 2 % EX PADS: 6.0000 | MEDICATED_PAD | Freq: Once | CUTANEOUS | Status: DC

## 2024-05-25 MED ORDER — MIDAZOLAM HCL (PF) 2 MG/2ML IJ SOLN
INTRAMUSCULAR | Status: AC | PRN
  Administered 2024-05-25: 1 mg via INTRAVENOUS

## 2024-05-25 MED ORDER — CEFAZOLIN SODIUM-DEXTROSE 2-4 GM/100ML-% IV SOLN: 2.0000 g | INTRAVENOUS | Status: DC

## 2024-05-25 MED ORDER — FENTANYL CITRATE (PF) 100 MCG/2ML IJ SOLN
INTRAMUSCULAR | Status: AC
  Filled 2024-05-25: qty 2

## 2024-05-25 MED ORDER — MIDAZOLAM HCL 2 MG/2ML IJ SOLN
INTRAMUSCULAR | Status: AC
  Filled 2024-05-25: qty 2

## 2024-05-25 MED ORDER — IOHEXOL 300 MG/ML  SOLN
100.0000 mL | Freq: Once | INTRAMUSCULAR | Status: AC | PRN
  Administered 2024-05-25: 36 mL via INTRA_ARTERIAL

## 2024-05-25 MED ORDER — LIDOCAINE HCL 1 % IJ SOLN
INTRAMUSCULAR | Status: AC
  Filled 2024-05-25: qty 20

## 2024-05-25 MED ORDER — NITROGLYCERIN 1 MG/10 ML FOR IR/CATH LAB
INTRA_ARTERIAL | Status: AC
  Filled 2024-05-25: qty 10

## 2024-05-25 MED ORDER — HEPARIN SODIUM (PORCINE) 1000 UNIT/ML IJ SOLN
INTRAMUSCULAR | Status: AC
  Filled 2024-05-25: qty 10

## 2024-05-25 MED ORDER — HEPARIN SODIUM (PORCINE) 1000 UNIT/ML IJ SOLN
INTRAMUSCULAR | Status: AC | PRN
Start: 2024-05-25 — End: 2024-05-25
  Administered 2024-05-25: 3000 [IU] via INTRAVENOUS

## 2024-05-25 NOTE — Progress Notes (Signed)
 Discharge instructions reviewed with patient and sister at bedside. Denies questions concerns. PT tolerated PO intake. Ambulated in the hallway, was able to void without difficulty. Incision site remains clean, dry, intact, and soft. No s/s of complications. PT escorted from the unit via wheel chair to personal vehicle.

## 2024-05-25 NOTE — Op Note (Signed)
 ENDOVASCULAR NEUROSURGERY OPERATIVE NOTE   PROCEDURE: Diagnostic Cerebral Angiogram   SURGEON:   Dr. Gerldine Maizes, MD  HISTORY:   The patient is a 63 y.o. yo female With a history of incidentally discovered right-sided MCA and carotid aneurysms treated by Dr. Monna with pipeline stents about six months ago.  Patient has done well from a neurologic standpoint.  She comes in today for routine short-term angiographic follow-up.  APPROACH:   The technical aspects of the procedure as well as its potential risks and benefits were reviewed with the patient. These risks included but were not limited bleeding, infection, allergic reaction, damage to organs/vital structures, stroke, non-diagnostic procedure, and the catastrophic outcomes of heart attack, coma, and death. With an understanding of these risks, informed consent was obtained and witnessed.    The patient was placed in the supine position on the angiography table and the skin of right groin prepped in the usual sterile fashion. The procedure was performed under local anesthesia (1%-solution of bicarbonate-bufferred Lidoacaine) and conscious sedation with Versed  and fentanyl  monitored by the in-suite nurse and myself, including non-invasive blood pressure and continuous pulse oxymetry.    Access to the right radial artery was obtained using ultrasound guidance in order to directly visualize the micropuncture needle within the lumen of the right radial artery.  A short five French glide slender sheath was then placed using standard Seldinger technique.    HEPARIN :  3000 Units total.    CONTRAST AGENT:  See IR records   FLUOROSCOPY TIME:  See IR records    CATHETER(S) AND WIRE(S):    5-French Simmons-2 glidecatheter   0.035" glidewire    VESSELS CATHETERIZED:   Right internal carotid   Left internal carotid   Right common femoral  VESSELS STUDIED:   Right internal carotid, head Left internal carotid, head Right  femoral  PROCEDURAL NARRATIVE:   A 5-Fr Simmons 2 terumo glide catheter was advanced over a 0.035 glidewire into the aortic arch. The above vessels were then sequentially catheterized and cervical/cerebral angiograms taken. After review of images, the catheter was removed without incident.    INTERPRETATION:   Right internal carotid, head:   Injection reveals the presence of a patent ICA with normal bifurcation into M1, and A1 segments.  The venous sinuses are widely patent.  Pipeline device is seen extending within the supraclinoid internal carotid artery without any aneurysm opacification seen.  There is no stenosis within the carotid pipeline stent.  Another pipeline stent is seen extending from the distal M1 into the M2 segment.  There is significant InStent stenosis, although there is continued good perfusion of the distal MCA branches.  The previously seen MCA bifurcation aneurysm is no longer opacified.  Left internal carotid, head:   Injection reveals the presence of a widely patent ICA, A1, and M1 segments and their branches. There is a small approximately 2 mm aneurysm arising from the dorsal wall of the supraclinoid internal carotid artery incorporating the origin of the posterior communicating artery.  Aneurysm had a relatively smooth overall appearance.  Size and morphology appear to be stable in comparison to the prior angiogram. The parenchymal and venous phases are unremarkable. The venous sinuses are widely patent.    Left vertebral:   Injection reveals the presence of a widely patent vertebral artery. This leads to a widely patent basilar artery that terminates in bilateral P1. The basilar apex is normal. No aneurysms, arteriovenous malformations, or high flow fistulas are visualized. The parenchymal and venous phases  are normal. The venous sinuses are patent.    DISPOSITION:  Upon completion of the study, the sheath was removed and hemostasis obtained using a TR-band. Good proximal  and distal extremity pulses were documented upon achievement of hemostasis. The procedure was well tolerated and no early complications were observed.  The patient was transferred to the recovery area for further care.  IMPRESSION:  1. Complete occlusion of previously treated supraclinoid right internal carotid artery aneurysm six months after placement of a pipeline embolization device. 2. Complete occlusion of the previously treated right MCA bifurcation aneurysm, with significant InStent stenosis although preserved perfusion of the distal MCA branches. 3. Stable appearance of smooth 2 mm left posterior communicating artery aneurysm.   Gerldine Maizes, MD [facility]

## 2024-05-25 NOTE — H&P (Signed)
 Chief Complaint   Aneurysm  History of Present Illness    Tammy Aguirre is a 63 year old woman I am seeing for initial consultation having previously been treated by Dr. Monna. Briefly, the patient suffered onset of relatively severe headache while exercising at the beginning of this year. Symptoms gradually improved however she mentioned it to her primary doctor who ordered a CT and subsequent MRI revealing right middle cerebral and right posterior communicating artery aneurysms. Patient underwent elective pipeline embolization of the former aneurysm in March and the latter in May. She remains on aspirin  81 mg daily and Plavix  37.5 mg daily. She does report having a spot of blurry vision in her right eye after the initial pipeline procedure in March. She is otherwise doing well. Of note, the patient denies any significant medical history, including hypertension, diabetes, previous heart attack or stroke. No known lung, liver, kidney disease. No cancer history. Patient is not on any blood thinners or antiplatelet agents. Patient is a nonsmoker.    Past Medical History   Past Medical History:  Diagnosis Date   Anemia    History of   Arthritis    Right Shoulder   History of TIA (transient ischemic attack)    Hypertension    Migraines    History of   PONV (postoperative nausea and vomiting)     Past Surgical History   Past Surgical History:  Procedure Laterality Date   ABDOMINAL HYSTERECTOMY     CESAREAN SECTION     x 1   COLONOSCOPY     IR 3D INDEPENDENT WKST  10/17/2023   IR ANGIO INTRA EXTRACRAN SEL INTERNAL CAROTID UNI R MOD SED  12/05/2023   IR ANGIO VERTEBRAL SEL VERTEBRAL BILAT MOD SED  10/17/2023   IR ANGIOGRAM FOLLOW UP STUDY  10/21/2023   IR CT HEAD LTD  10/17/2023   IR CT HEAD LTD  12/05/2023   IR NEURO EACH ADD'L AFTER BASIC UNI RIGHT (MS)  10/21/2023   IR NEURO EACH ADD'L AFTER BASIC UNI RIGHT (MS)  12/05/2023   IR RADIOLOGIST EVAL & MGMT  09/30/2023   MRI   IR  RADIOLOGIST EVAL & MGMT  11/04/2023   IR RADIOLOGIST EVAL & MGMT  12/20/2023   IR TRANSCATH/EMBOLIZ  10/17/2023   IR TRANSCATH/EMBOLIZ  12/05/2023   NECK SURGERY     plate in neck   RADIOLOGY WITH ANESTHESIA N/A 10/17/2023   Procedure: RADIOLOGY WITH ANESTHESIA;  Surgeon: Dolphus Carrion, MD;  Location: [facility] OR;  Service: Radiology;  Laterality: N/A;  Angiogram with intent to treat   RADIOLOGY WITH ANESTHESIA N/A 12/05/2023   Procedure: RADIOLOGY WITH ANESTHESIA;  Surgeon: Dolphus Carrion, MD;  Location: [facility] OR;  Service: Radiology;  Laterality: N/A;  Endovascular treatment of the second aneurysm in the right posterior communicating artery region   SHOULDER ARTHROSCOPY WITH OPEN ROTATOR CUFF REPAIR AND DISTAL CLAVICLE ACROMINECTOMY Right 11/24/2021   Procedure: RIGHT SHOULDER ARTHROSCOPIC SUBACROMIAL DECOMPRESSION WITH OPEN ROTATOR CUFF REPAIR AND DISTAL CLAVICLE ACROMINECTOMY;  Surgeon: Anderson Maude ORN, MD;  Location: [facility];  Service: Orthopedics;  Laterality: Right;   UPPER GI ENDOSCOPY      Social History   Social History   Tobacco Use   Smoking status: Never   Smokeless tobacco: Never  Vaping Use   Vaping status: Never Used  Substance Use Topics   Alcohol use: No   Drug use: No    Medications   Prior to Admission medications   Medication Sig Start Date End Date  Taking? Authorizing Provider  aspirin  EC 81 MG tablet Take 81 mg by mouth daily. Swallow whole.   Yes [provider]  clopidogrel  (PLAVIX ) 75 MG tablet Take 0.5 tablets (37.5 mg total) by mouth daily. 02/22/24 02/21/25 Yes Matthews, Kacie Sue-Ellen, PA  finasteride (PROSCAR) 5 MG tablet Take 5 mg by mouth daily. 11/07/17  Yes [provider]  irbesartan-hydrochlorothiazide (AVALIDE) 150-12.5 MG tablet Take 1 tablet by mouth every evening. 10/19/17  Yes [provider]  OVER THE COUNTER MEDICATION Take 4 tablets by mouth daily. Nutrafol supplement   Yes [provider]  triamcinolone  ointment (KENALOG) 0.1 % Apply 1 Application topically 2 (two) times daily as needed (rash). 07/05/23  Yes [provider]  Bacillus Coagulans-Inulin (ALIGN PREBIOTIC-PROBIOTIC PO) Take by mouth.    [provider]  fexofenadine (ALLEGRA) 180 MG tablet Take 180 mg by mouth daily as needed for allergies.    [provider]  VITAMIN D PO Take 3 drops by mouth daily. liquid    [provider]    Allergies   Allergies  Allergen Reactions   Tetracyclines & Related Rash    Review of Systems  ROS  Neurologic Exam  Awake, alert, oriented Memory and concentration grossly intact Speech fluent, appropriate CN grossly intact Motor exam: Upper Extremities Deltoid Bicep Tricep Grip  Right 5/5 5/5 5/5 5/5  Left 5/5 5/5 5/5 5/5   Lower Extremities IP Quad PF DF EHL  Right 5/5 5/5 5/5 5/5 5/5  Left 5/5 5/5 5/5 5/5 5/5   Sensation grossly intact to LT  Impression  - 63 y.o. female several months s/p endovascular treatment of right ICA aneurysms, doing well  Plan  - Will proceed with diagnostic angiogram  I have reviewed the indications for the procedure as well as the details of the procedure and the expected postoperative course and recovery at length with the patient in the office. We have also reviewed in detail the risks, benefits, and alternatives to the procedure. All questions were answered and Tammy Aguirre provided informed consent to proceed.  Gerldine Maizes, MD [facility]

## 2024-05-25 NOTE — Progress Notes (Addendum)
 Patient returned from IR with a hematoma located medially on the right forearm. IR RN notified the MD resulting in the next steps: 1CC of air returned to the band at 0950 totaling [facility] of air. Pulse was obtained via doppler. Good Pleth on O2 sats. Patient denies numbness or tingling in affected extremity. Pressure was held by this RN for 10 minutes.

## 2024-06-04 ENCOUNTER — Encounter (HOSPITAL_COMMUNITY): Payer: Self-pay

## 2024-06-04 DIAGNOSIS — I671 Cerebral aneurysm, nonruptured: Secondary | ICD-10-CM | POA: Diagnosis not present

## 2024-07-09 ENCOUNTER — Ambulatory Visit
Admission: RE | Admit: 2024-07-09 | Discharge: 2024-07-09 | Disposition: A | Source: Ambulatory Visit | Attending: Obstetrics and Gynecology | Admitting: Obstetrics and Gynecology

## 2024-07-09 DIAGNOSIS — Z1330 Encounter for screening examination for mental health and behavioral disorders, unspecified: Secondary | ICD-10-CM | POA: Diagnosis not present

## 2024-07-09 DIAGNOSIS — Z01419 Encounter for gynecological examination (general) (routine) without abnormal findings: Secondary | ICD-10-CM | POA: Diagnosis not present

## 2024-07-09 DIAGNOSIS — Z1231 Encounter for screening mammogram for malignant neoplasm of breast: Secondary | ICD-10-CM
